# Patient Record
Sex: Female | Born: 1960 | Race: White | Hispanic: No | Marital: Married | Smoking: Never smoker
Health system: Southern US, Community
[De-identification: ages and names within clinical notes are randomized; demographics above are authoritative.]

## PROBLEM LIST (undated history)

## (undated) DIAGNOSIS — M8580 Other specified disorders of bone density and structure, unspecified site: Secondary | ICD-10-CM

## (undated) DIAGNOSIS — IMO0002 Reserved for concepts with insufficient information to code with codable children: Secondary | ICD-10-CM

## (undated) DIAGNOSIS — N83209 Unspecified ovarian cyst, unspecified side: Secondary | ICD-10-CM

## (undated) DIAGNOSIS — N921 Excessive and frequent menstruation with irregular cycle: Secondary | ICD-10-CM

## (undated) DIAGNOSIS — I10 Essential (primary) hypertension: Secondary | ICD-10-CM

## (undated) DIAGNOSIS — L920 Granuloma annulare: Secondary | ICD-10-CM

## (undated) DIAGNOSIS — B3731 Acute candidiasis of vulva and vagina: Secondary | ICD-10-CM

## (undated) DIAGNOSIS — B373 Candidiasis of vulva and vagina: Secondary | ICD-10-CM

## (undated) HISTORY — DX: Unspecified ovarian cyst, unspecified side: N83.209

## (undated) HISTORY — PX: EYE SURGERY: SHX253

## (undated) HISTORY — DX: Reserved for concepts with insufficient information to code with codable children: IMO0002

## (undated) HISTORY — DX: Candidiasis of vulva and vagina: B37.3

## (undated) HISTORY — DX: Granuloma annulare: L92.0

## (undated) HISTORY — DX: Essential (primary) hypertension: I10

## (undated) HISTORY — DX: Excessive and frequent menstruation with irregular cycle: N92.1

## (undated) HISTORY — DX: Other specified disorders of bone density and structure, unspecified site: M85.80

## (undated) HISTORY — DX: Acute candidiasis of vulva and vagina: B37.31

---

## 2000-04-02 ENCOUNTER — Encounter: Payer: Self-pay | Admitting: Emergency Medicine

## 2000-04-02 ENCOUNTER — Emergency Department (HOSPITAL_COMMUNITY): Admission: EM | Admit: 2000-04-02 | Discharge: 2000-04-02 | Payer: Self-pay | Admitting: Emergency Medicine

## 2000-10-04 ENCOUNTER — Other Ambulatory Visit: Admission: RE | Admit: 2000-10-04 | Discharge: 2000-10-04 | Payer: Self-pay | Admitting: Internal Medicine

## 2000-10-26 ENCOUNTER — Encounter: Payer: Self-pay | Admitting: Internal Medicine

## 2000-10-26 ENCOUNTER — Encounter: Admission: RE | Admit: 2000-10-26 | Discharge: 2000-10-26 | Payer: Self-pay | Admitting: Internal Medicine

## 2001-10-06 ENCOUNTER — Other Ambulatory Visit: Admission: RE | Admit: 2001-10-06 | Discharge: 2001-10-06 | Payer: Self-pay | Admitting: Internal Medicine

## 2002-01-20 ENCOUNTER — Encounter: Admission: RE | Admit: 2002-01-20 | Discharge: 2002-01-20 | Payer: Self-pay | Admitting: Internal Medicine

## 2002-01-20 ENCOUNTER — Encounter: Payer: Self-pay | Admitting: Internal Medicine

## 2003-04-13 ENCOUNTER — Encounter: Admission: RE | Admit: 2003-04-13 | Discharge: 2003-04-13 | Payer: Self-pay | Admitting: Internal Medicine

## 2003-04-13 ENCOUNTER — Encounter: Payer: Self-pay | Admitting: Internal Medicine

## 2004-04-21 ENCOUNTER — Encounter: Admission: RE | Admit: 2004-04-21 | Discharge: 2004-04-21 | Payer: Self-pay | Admitting: Internal Medicine

## 2004-06-03 ENCOUNTER — Encounter: Admission: RE | Admit: 2004-06-03 | Discharge: 2004-06-03 | Payer: Self-pay | Admitting: Internal Medicine

## 2004-08-26 ENCOUNTER — Encounter: Admission: RE | Admit: 2004-08-26 | Discharge: 2004-08-26 | Payer: Self-pay | Admitting: Internal Medicine

## 2004-12-09 ENCOUNTER — Other Ambulatory Visit: Admission: RE | Admit: 2004-12-09 | Discharge: 2004-12-09 | Payer: Self-pay | Admitting: Obstetrics and Gynecology

## 2005-05-13 ENCOUNTER — Encounter: Admission: RE | Admit: 2005-05-13 | Discharge: 2005-05-13 | Payer: Self-pay | Admitting: Internal Medicine

## 2005-10-21 ENCOUNTER — Other Ambulatory Visit: Admission: RE | Admit: 2005-10-21 | Discharge: 2005-10-21 | Payer: Self-pay | Admitting: Obstetrics and Gynecology

## 2006-05-17 ENCOUNTER — Encounter: Admission: RE | Admit: 2006-05-17 | Discharge: 2006-05-17 | Payer: Self-pay | Admitting: Obstetrics and Gynecology

## 2006-08-03 DIAGNOSIS — R87619 Unspecified abnormal cytological findings in specimens from cervix uteri: Secondary | ICD-10-CM

## 2006-08-03 DIAGNOSIS — IMO0002 Reserved for concepts with insufficient information to code with codable children: Secondary | ICD-10-CM

## 2006-08-03 HISTORY — DX: Unspecified abnormal cytological findings in specimens from cervix uteri: R87.619

## 2006-08-03 HISTORY — PX: COLPOSCOPY: SHX161

## 2006-08-03 HISTORY — DX: Reserved for concepts with insufficient information to code with codable children: IMO0002

## 2007-01-17 ENCOUNTER — Encounter: Admission: RE | Admit: 2007-01-17 | Discharge: 2007-01-17 | Payer: Self-pay | Admitting: Internal Medicine

## 2007-05-12 ENCOUNTER — Encounter: Admission: RE | Admit: 2007-05-12 | Discharge: 2007-05-12 | Payer: Self-pay | Admitting: Sports Medicine

## 2007-06-01 ENCOUNTER — Encounter: Admission: RE | Admit: 2007-06-01 | Discharge: 2007-06-01 | Payer: Self-pay | Admitting: Obstetrics and Gynecology

## 2008-06-04 ENCOUNTER — Encounter: Admission: RE | Admit: 2008-06-04 | Discharge: 2008-06-04 | Payer: Self-pay | Admitting: Obstetrics and Gynecology

## 2009-06-10 ENCOUNTER — Encounter: Admission: RE | Admit: 2009-06-10 | Discharge: 2009-06-10 | Payer: Self-pay | Admitting: Obstetrics and Gynecology

## 2010-06-13 ENCOUNTER — Encounter: Admission: RE | Admit: 2010-06-13 | Discharge: 2010-06-13 | Payer: Self-pay | Admitting: Internal Medicine

## 2010-08-23 ENCOUNTER — Encounter: Payer: Self-pay | Admitting: Internal Medicine

## 2011-05-13 ENCOUNTER — Other Ambulatory Visit: Payer: Self-pay | Admitting: Internal Medicine

## 2011-05-13 DIAGNOSIS — Z1231 Encounter for screening mammogram for malignant neoplasm of breast: Secondary | ICD-10-CM

## 2011-06-16 ENCOUNTER — Ambulatory Visit
Admission: RE | Admit: 2011-06-16 | Discharge: 2011-06-16 | Disposition: A | Discharge status: Home / Self Care | Payer: PRIVATE HEALTH INSURANCE | Source: Ambulatory Visit | Attending: Internal Medicine | Admitting: Internal Medicine

## 2011-06-16 DIAGNOSIS — Z1231 Encounter for screening mammogram for malignant neoplasm of breast: Secondary | ICD-10-CM

## 2012-02-03 ENCOUNTER — Encounter: Payer: Self-pay | Admitting: Obstetrics and Gynecology

## 2012-02-03 ENCOUNTER — Ambulatory Visit (INDEPENDENT_AMBULATORY_CARE_PROVIDER_SITE_OTHER): Payer: PRIVATE HEALTH INSURANCE | Admitting: Obstetrics and Gynecology

## 2012-02-03 ENCOUNTER — Telehealth: Payer: Self-pay | Admitting: Obstetrics and Gynecology

## 2012-02-03 VITALS — BP 100/62 | Resp 14 | Wt 106.0 lb

## 2012-02-03 DIAGNOSIS — R300 Dysuria: Secondary | ICD-10-CM

## 2012-02-03 DIAGNOSIS — B9689 Other specified bacterial agents as the cause of diseases classified elsewhere: Secondary | ICD-10-CM

## 2012-02-03 DIAGNOSIS — A499 Bacterial infection, unspecified: Secondary | ICD-10-CM

## 2012-02-03 DIAGNOSIS — N760 Acute vaginitis: Secondary | ICD-10-CM

## 2012-02-03 DIAGNOSIS — L293 Anogenital pruritus, unspecified: Secondary | ICD-10-CM

## 2012-02-03 DIAGNOSIS — N898 Other specified noninflammatory disorders of vagina: Secondary | ICD-10-CM

## 2012-02-03 LAB — POCT URINALYSIS DIPSTICK
Bilirubin, UA: NEGATIVE
Blood, UA: NEGATIVE
Glucose, UA: NEGATIVE
Ketones, UA: NEGATIVE
Leukocytes, UA: NEGATIVE
Nitrite, UA: NEGATIVE
Protein, UA: NEGATIVE
Spec Grav, UA: 1.015
Urobilinogen, UA: NEGATIVE
pH, UA: 6

## 2012-02-03 MED ORDER — METRONIDAZOLE 500 MG PO TABS: 500.0000 mg | ORAL_TABLET | Freq: Two times a day (BID) | ORAL | Status: AC

## 2012-02-03 MED ORDER — TERCONAZOLE 0.8 % VA CREA: 1.0000 | TOPICAL_CREAM | Freq: Every day | VAGINAL | Status: AC

## 2012-02-03 NOTE — Progress Notes (Signed)
Odor: no Fever: no Pelvic Pain: no  Itching: yes with irritation of labia Bilat Dyspareunia: Not sexually active Desires GC/CT: no  Thin: yes History of PID: no Desires HIV,RPR,HbsAG: no  Thick: no History of STD: no Other: c/o burning while voiding    Patient 51 y/o presents with Hx of vaginally itching x 2 weeks, associated episode with riding her bike and started to burn after this time. progresivesly worse in the past few days and now seeking medical review. Examination:  Labia bilat red and tender to touch, introitus dry. The patient had been complaining of vaginal dryness but has decline any vaginal cream for this condition. Speculum examination: Cervix slight irritation noted and after swab for Wet Prep slight bleeding noted (patient advised re spotting) vault pink and slight inflammation. Wet Prep: PH 4.0, Clue cells - BV-  Patient had requested Terazol cream in case of Yeast - same prescribed Dx: BV  Plan: BV Tx'ed with flagul 500 mgs po x 7 days          Terazol creamas requested by patient for ? Yeast and vaginal irritation          To return PRN  Or if symptoms do not subside.

## 2012-04-01 ENCOUNTER — Ambulatory Visit (INDEPENDENT_AMBULATORY_CARE_PROVIDER_SITE_OTHER): Payer: PRIVATE HEALTH INSURANCE | Admitting: Obstetrics and Gynecology

## 2012-04-01 ENCOUNTER — Encounter: Payer: Self-pay | Admitting: Obstetrics and Gynecology

## 2012-04-01 VITALS — BP 120/70 | HR 62 | Ht 61.0 in | Wt 105.0 lb

## 2012-04-01 DIAGNOSIS — Z124 Encounter for screening for malignant neoplasm of cervix: Secondary | ICD-10-CM

## 2012-04-01 DIAGNOSIS — M899 Disorder of bone, unspecified: Secondary | ICD-10-CM

## 2012-04-01 DIAGNOSIS — M949 Disorder of cartilage, unspecified: Secondary | ICD-10-CM

## 2012-04-01 DIAGNOSIS — N952 Postmenopausal atrophic vaginitis: Secondary | ICD-10-CM

## 2012-04-01 DIAGNOSIS — M8580 Other specified disorders of bone density and structure, unspecified site: Secondary | ICD-10-CM

## 2012-04-01 NOTE — Patient Instructions (Signed)
Atrophic Vaginitis Atrophic vaginitis is a problem of low levels of estrogen in women. This problem can happen at any age. It is most common in women who have gone through menopause ("the change").  HOW WILL I KNOW IF I HAVE THIS PROBLEM? You may have:  Trouble with peeing (urinating), such as:   Going to the bathroom often.   A hard time holding your pee until you reach a bathroom.   Leaking pee.   Having pain when you pee.   Itching or a burning feeling.   Vaginal bleeding and spotting.   Pain during sex.   Dryness of the vagina.   A yellow, bad-smelling fluid (discharge) coming from the vagina.  HOW WILL MY DOCTOR CHECK FOR THIS PROBLEM?  During your exam, your doctor will likely find the problem.   If there is a vaginal fluid, it may be checked for infection.  HOW WILL THIS PROBLEM BE TREATED? Keep the vulvar skin as clean as possible. Moisturizers and lubricants can help with some of the symptoms. Estrogen replacement can help. There are 2 ways to take estrogen:  Systemic estrogen gets estrogen to your whole body. It takes many weeks or months before the symptoms get better.   You take an estrogen pill.   You use a skin patch. This is a patch that you put on your skin.   If you still have your uterus, your doctor may ask you to take a hormone. Talk to your doctor about the right medicine for you.   Estrogen cream.  This puts estrogen only at the part of your body where you apply it. The cream is put into the vagina or put on the vulvar skin. For some women, estrogen cream works faster than pills or the patch.  CAN ALL WOMEN WITH THIS PROBLEM USE ESTROGEN? No. Women with certain types of cancer, liver problems, or problems with blood clots should not take estrogen. Your doctor can help you decide the best treatment for your symptoms. Document Released: 01/06/2008 Document Revised: 07/09/2011 Document Reviewed: 01/06/2008 [facility] Patient Information 2012 [place],  [place]. astroglide and replens can be used

## 2012-04-01 NOTE — Progress Notes (Signed)
Last Pap: 03/30/11 WNL: Yes Regular Periods:no Contraception: post menopausal  Monthly Breast exam:no Tetanus<85yrs:yes Nl.Bladder Function:yes Daily BMs:yes Healthy Diet:yes Calcium:no Mammogram:yes Date of Mammogram: 06/16/11 wnl Exercise:yes Have often Exercise: 4-5 times per week  Seatbelt: yes Abuse at home: no Stressful work:no Sigmoid-colonoscopy: 2011 wnl Bone Density: Yes 04/23/11 PCP: Dr. Dorothyann Peng Change in PMH: none Change in RUE:AVWU BP 120/70  Pulse 62  Ht 5\' 1"  (1.549 m)  Wt 105 lb (47.628 kg)  BMI 19.84 kg/m2 Physical Examination: Neck - supple, no significant adenopathy Chest - clear to auscultation, no wheezes, rales or rhonchi, symmetric air entry Heart - normal rate, regular rhythm, normal S1, S2, no murmurs, rubs, clicks or gallops Abdomen - soft, nontender, nondistended, no masses or organomegaly Breasts - breasts appear normal, no suspicious masses, no skin or nipple changes or axillary nodes Pelvic - normal external genitalia, vulva, vagina, cervix, uterus and adnexa, atrophic Rectal - normal rectal, no masses Musculoskeletal - no joint tenderness, deformity or swelling Extremities - peripheral pulses normal, no pedal edema, no clubbing or cyanosis Skin - normal coloration and turgor, no rashes, no suspicious skin lesions noted Normal AEX Atrophic vaginitis osteopenia Pt due for mammogram yes 11/13 colonoscopy due no Pap done yes per pts request RT one year Diet and exercise discussed.  Pt doing weight bearing exercise Dexa in 2015

## 2012-04-06 LAB — PAP IG W/ RFLX HPV ASCU

## 2012-04-09 LAB — HUMAN PAPILLOMAVIRUS, HIGH RISK: HPV DNA High Risk: NOT DETECTED

## 2012-04-13 ENCOUNTER — Telehealth: Payer: Self-pay

## 2012-04-13 NOTE — Telephone Encounter (Signed)
Spoke with pt rgd labs informed pap showed abnl cells per nd repeat pap 73yr pt voice understanding

## 2012-04-13 NOTE — Telephone Encounter (Signed)
Message copied by Rolla Plate on Wed Apr 13, 2012 10:16 AM ------      Message from: Jaymes Graff      Created: Tue Apr 12, 2012 10:38 PM       Please tell patient her pap results and that she can repeat her pap in one year.  Thank you

## 2012-04-14 ENCOUNTER — Telehealth: Payer: Self-pay | Admitting: Obstetrics and Gynecology

## 2012-04-14 NOTE — Telephone Encounter (Signed)
Spoke with pt rgd msg pt concerned about pap results informed pt pap showed ascus cells per ND repeat pap 1 yr pt states have a history of abnl pt [place] pt wants to make sure she doesn't need repeat pap in 6 months or colpo pt will fax records informed pt will consult with nd and call her back pt voice understanding

## 2012-04-14 NOTE — Telephone Encounter (Signed)
NICCOLE/ND PT

## 2012-04-15 NOTE — Telephone Encounter (Signed)
Spoke with pt rgd previous msg informed pt consult with nd she is ok with repeat pap 1 yr but if pt wants colpo she we can do one pt wants to have colpo done pt has appt 05/06/12 at 2:45 with ND pt voice understanding

## 2012-05-06 ENCOUNTER — Ambulatory Visit (INDEPENDENT_AMBULATORY_CARE_PROVIDER_SITE_OTHER): Payer: PRIVATE HEALTH INSURANCE | Admitting: Obstetrics and Gynecology

## 2012-05-06 ENCOUNTER — Encounter: Payer: Self-pay | Admitting: Obstetrics and Gynecology

## 2012-05-06 VITALS — BP 110/72 | Wt 104.0 lb

## 2012-05-06 DIAGNOSIS — R87610 Atypical squamous cells of undetermined significance on cytologic smear of cervix (ASC-US): Secondary | ICD-10-CM

## 2012-05-06 DIAGNOSIS — IMO0001 Reserved for inherently not codable concepts without codable children: Secondary | ICD-10-CM

## 2012-05-06 NOTE — Patient Instructions (Addendum)

## 2012-05-06 NOTE — Progress Notes (Signed)
Previous Pap Smear: ASCUS 04/01/12 Previous Colposcopy: 12/10/06 negative bx Referred From: n/a LMP: n/a  Contraception: post menopausal G,P: 0,0 colpo done.  Aw changes seen at seven o clock bx done at seven and with [facility]

## 2012-05-09 ENCOUNTER — Other Ambulatory Visit: Payer: Self-pay | Admitting: Internal Medicine

## 2012-05-09 DIAGNOSIS — Z1231 Encounter for screening mammogram for malignant neoplasm of breast: Secondary | ICD-10-CM

## 2012-05-11 ENCOUNTER — Telehealth: Payer: Self-pay | Admitting: Obstetrics and Gynecology

## 2012-05-11 LAB — PATHOLOGY

## 2012-05-11 NOTE — Telephone Encounter (Signed)
Tc to pt per telephone call. Informed pt still awaiting results to come back from colposcopy done 05/06/12.  Pt voices understanding.

## 2012-05-12 ENCOUNTER — Telehealth: Payer: Self-pay

## 2012-05-12 NOTE — Telephone Encounter (Signed)
Message copied by Rolla Plate on Thu May 12, 2012  9:47 AM ------      Message from: Jaymes Graff      Created: Wed May 11, 2012 11:22 PM       Please review colpo results with patient and tell her I recommend a pap every six months for the next year.

## 2012-05-12 NOTE — Telephone Encounter (Signed)
Spoke with pt rgd labs informed colpo results per ND repeat pap every 6 months pt voice understanding

## 2012-05-17 ENCOUNTER — Other Ambulatory Visit: Payer: Self-pay | Admitting: Internal Medicine

## 2012-05-17 DIAGNOSIS — H539 Unspecified visual disturbance: Secondary | ICD-10-CM

## 2012-05-17 DIAGNOSIS — R519 Headache, unspecified: Secondary | ICD-10-CM

## 2012-05-26 ENCOUNTER — Other Ambulatory Visit: Payer: Self-pay

## 2012-06-17 ENCOUNTER — Ambulatory Visit
Admission: RE | Admit: 2012-06-17 | Discharge: 2012-06-17 | Disposition: A | Discharge status: Home / Self Care | Payer: PRIVATE HEALTH INSURANCE | Source: Ambulatory Visit | Attending: Internal Medicine | Admitting: Internal Medicine

## 2012-06-17 DIAGNOSIS — Z1231 Encounter for screening mammogram for malignant neoplasm of breast: Secondary | ICD-10-CM

## 2012-06-22 ENCOUNTER — Encounter: Payer: Self-pay | Admitting: Obstetrics and Gynecology

## 2012-06-27 ENCOUNTER — Telehealth: Payer: Self-pay | Admitting: Obstetrics and Gynecology

## 2012-06-27 NOTE — Telephone Encounter (Signed)
Pt calling regarding dense breast letter that was received. Answered questions regarding dense breast.  Pt voiced understanding.  Susan Mason

## 2012-08-25 ENCOUNTER — Telehealth: Payer: Self-pay | Admitting: Obstetrics and Gynecology

## 2012-08-25 NOTE — Telephone Encounter (Signed)
Tc to pt regarding message. Pt states that her last office visit note said she was to come back in a year and she wanted to clarify. Informed pt that she suppose to repeat a pap every 6 months for a year per ND. Pt voiced understanding and appt for repeat pap was scheduled.

## 2012-09-16 ENCOUNTER — Encounter: Payer: PRIVATE HEALTH INSURANCE | Admitting: Obstetrics and Gynecology

## 2012-12-16 ENCOUNTER — Ambulatory Visit: Payer: Self-pay | Admitting: Nurse Practitioner

## 2013-05-24 ENCOUNTER — Other Ambulatory Visit: Payer: Self-pay

## 2013-05-24 DIAGNOSIS — Z1231 Encounter for screening mammogram for malignant neoplasm of breast: Secondary | ICD-10-CM

## 2013-07-07 ENCOUNTER — Ambulatory Visit
Admission: RE | Admit: 2013-07-07 | Discharge: 2013-07-07 | Disposition: A | Discharge status: Home / Self Care | Payer: PRIVATE HEALTH INSURANCE | Source: Ambulatory Visit

## 2013-07-07 DIAGNOSIS — Z1231 Encounter for screening mammogram for malignant neoplasm of breast: Secondary | ICD-10-CM

## 2014-03-13 ENCOUNTER — Other Ambulatory Visit: Payer: Self-pay | Admitting: Neurology

## 2014-03-13 DIAGNOSIS — R200 Anesthesia of skin: Secondary | ICD-10-CM

## 2014-03-21 ENCOUNTER — Other Ambulatory Visit: Payer: Self-pay | Admitting: Neurology

## 2014-03-21 ENCOUNTER — Ambulatory Visit
Admission: RE | Admit: 2014-03-21 | Discharge: 2014-03-21 | Disposition: A | Discharge status: Home / Self Care | Payer: PRIVATE HEALTH INSURANCE | Source: Ambulatory Visit | Attending: Neurology | Admitting: Neurology

## 2014-03-21 DIAGNOSIS — R200 Anesthesia of skin: Secondary | ICD-10-CM

## 2014-03-22 ENCOUNTER — Other Ambulatory Visit: Payer: Self-pay | Admitting: Neurology

## 2014-03-22 DIAGNOSIS — R202 Paresthesia of skin: Secondary | ICD-10-CM

## 2014-03-22 DIAGNOSIS — R200 Anesthesia of skin: Secondary | ICD-10-CM

## 2014-03-29 ENCOUNTER — Other Ambulatory Visit: Payer: PRIVATE HEALTH INSURANCE

## 2014-03-30 ENCOUNTER — Ambulatory Visit
Admission: RE | Admit: 2014-03-30 | Discharge: 2014-03-30 | Disposition: A | Discharge status: Home / Self Care | Payer: PRIVATE HEALTH INSURANCE | Source: Ambulatory Visit | Attending: Neurology | Admitting: Neurology

## 2014-03-30 DIAGNOSIS — R200 Anesthesia of skin: Secondary | ICD-10-CM

## 2014-03-30 DIAGNOSIS — R202 Paresthesia of skin: Secondary | ICD-10-CM

## 2014-04-04 ENCOUNTER — Other Ambulatory Visit: Payer: Self-pay | Admitting: Neurology

## 2014-04-04 DIAGNOSIS — R200 Anesthesia of skin: Secondary | ICD-10-CM

## 2014-04-18 ENCOUNTER — Ambulatory Visit
Admission: RE | Admit: 2014-04-18 | Discharge: 2014-04-18 | Disposition: A | Discharge status: Home / Self Care | Payer: PRIVATE HEALTH INSURANCE | Source: Ambulatory Visit | Attending: Neurology | Admitting: Neurology

## 2014-04-18 DIAGNOSIS — R200 Anesthesia of skin: Secondary | ICD-10-CM

## 2014-04-18 MED ORDER — GADOBENATE DIMEGLUMINE 529 MG/ML IV SOLN
9.0000 mL | Freq: Once | INTRAVENOUS | Status: AC | PRN
  Administered 2014-04-18: 9 mL via INTRAVENOUS

## 2014-06-01 ENCOUNTER — Other Ambulatory Visit: Payer: Self-pay

## 2014-06-01 DIAGNOSIS — Z1231 Encounter for screening mammogram for malignant neoplasm of breast: Secondary | ICD-10-CM

## 2014-07-13 ENCOUNTER — Ambulatory Visit
Admission: RE | Admit: 2014-07-13 | Discharge: 2014-07-13 | Disposition: A | Discharge status: Home / Self Care | Payer: PRIVATE HEALTH INSURANCE | Source: Ambulatory Visit

## 2014-07-13 ENCOUNTER — Other Ambulatory Visit: Payer: Self-pay

## 2014-07-13 DIAGNOSIS — Z1231 Encounter for screening mammogram for malignant neoplasm of breast: Secondary | ICD-10-CM

## 2014-11-12 ENCOUNTER — Other Ambulatory Visit: Payer: Self-pay | Admitting: Neurology

## 2014-11-12 DIAGNOSIS — E348 Other specified endocrine disorders: Secondary | ICD-10-CM

## 2014-11-12 DIAGNOSIS — R251 Tremor, unspecified: Secondary | ICD-10-CM

## 2014-11-12 DIAGNOSIS — R51 Headache: Secondary | ICD-10-CM

## 2014-11-12 DIAGNOSIS — R519 Headache, unspecified: Secondary | ICD-10-CM

## 2014-12-20 ENCOUNTER — Other Ambulatory Visit: Payer: PRIVATE HEALTH INSURANCE

## 2015-01-18 ENCOUNTER — Ambulatory Visit
Admission: RE | Admit: 2015-01-18 | Discharge: 2015-01-18 | Disposition: A | Discharge status: Home / Self Care | Payer: PRIVATE HEALTH INSURANCE | Source: Ambulatory Visit | Attending: Neurology | Admitting: Neurology

## 2015-01-18 DIAGNOSIS — R251 Tremor, unspecified: Secondary | ICD-10-CM

## 2015-01-18 DIAGNOSIS — E348 Other specified endocrine disorders: Secondary | ICD-10-CM

## 2015-01-18 DIAGNOSIS — R51 Headache: Secondary | ICD-10-CM

## 2015-01-18 DIAGNOSIS — R519 Headache, unspecified: Secondary | ICD-10-CM

## 2015-01-18 MED ORDER — GADOBENATE DIMEGLUMINE 529 MG/ML IV SOLN
9.0000 mL | Freq: Once | INTRAVENOUS | Status: AC | PRN
  Administered 2015-01-18: 9 mL via INTRAVENOUS

## 2015-05-24 LAB — HM COLONOSCOPY

## 2015-06-12 ENCOUNTER — Other Ambulatory Visit: Payer: Self-pay

## 2015-06-12 DIAGNOSIS — Z1231 Encounter for screening mammogram for malignant neoplasm of breast: Secondary | ICD-10-CM

## 2015-07-16 ENCOUNTER — Ambulatory Visit
Admission: RE | Admit: 2015-07-16 | Discharge: 2015-07-16 | Disposition: A | Discharge status: Home / Self Care | Payer: PRIVATE HEALTH INSURANCE | Source: Ambulatory Visit

## 2015-07-16 DIAGNOSIS — Z1231 Encounter for screening mammogram for malignant neoplasm of breast: Secondary | ICD-10-CM

## 2015-08-22 ENCOUNTER — Telehealth: Payer: Self-pay | Admitting: Cardiology

## 2015-08-22 NOTE — Telephone Encounter (Signed)
Pt called in wanting to speak with Elnita Maxwell about the Calcium deposits around her heart. Please f/u with her  Thanks

## 2015-08-22 NOTE — Telephone Encounter (Signed)
Patient wishes to talk to [facility] and willing to wait until tomorrow to talk to her

## 2015-08-23 NOTE — Telephone Encounter (Signed)
Wrong chart this is patient's daughter.

## 2016-06-10 ENCOUNTER — Other Ambulatory Visit: Payer: Self-pay | Admitting: Obstetrics and Gynecology

## 2016-06-10 DIAGNOSIS — Z1231 Encounter for screening mammogram for malignant neoplasm of breast: Secondary | ICD-10-CM

## 2016-07-16 ENCOUNTER — Ambulatory Visit: Payer: PRIVATE HEALTH INSURANCE

## 2016-10-08 ENCOUNTER — Ambulatory Visit
Admission: RE | Admit: 2016-10-08 | Discharge: 2016-10-08 | Disposition: A | Discharge status: Home / Self Care | Payer: PRIVATE HEALTH INSURANCE | Source: Ambulatory Visit | Attending: Obstetrics and Gynecology | Admitting: Obstetrics and Gynecology

## 2016-10-08 DIAGNOSIS — Z1231 Encounter for screening mammogram for malignant neoplasm of breast: Secondary | ICD-10-CM

## 2017-03-16 ENCOUNTER — Other Ambulatory Visit: Payer: Self-pay | Admitting: Internal Medicine

## 2017-03-16 DIAGNOSIS — E2839 Other primary ovarian failure: Secondary | ICD-10-CM

## 2017-03-25 ENCOUNTER — Ambulatory Visit
Admission: RE | Admit: 2017-03-25 | Discharge: 2017-03-25 | Disposition: A | Discharge status: Home / Self Care | Payer: PRIVATE HEALTH INSURANCE | Source: Ambulatory Visit | Attending: Internal Medicine | Admitting: Internal Medicine

## 2017-03-25 DIAGNOSIS — E2839 Other primary ovarian failure: Secondary | ICD-10-CM

## 2017-09-09 ENCOUNTER — Other Ambulatory Visit: Payer: Self-pay | Admitting: Internal Medicine

## 2017-09-09 ENCOUNTER — Other Ambulatory Visit: Payer: Self-pay | Admitting: Obstetrics and Gynecology

## 2017-09-09 DIAGNOSIS — Z1231 Encounter for screening mammogram for malignant neoplasm of breast: Secondary | ICD-10-CM

## 2017-10-20 ENCOUNTER — Ambulatory Visit
Admission: RE | Admit: 2017-10-20 | Discharge: 2017-10-20 | Disposition: A | Discharge status: Home / Self Care | Payer: PRIVATE HEALTH INSURANCE | Source: Ambulatory Visit | Attending: Internal Medicine | Admitting: Internal Medicine

## 2017-10-20 DIAGNOSIS — Z1231 Encounter for screening mammogram for malignant neoplasm of breast: Secondary | ICD-10-CM

## 2018-04-26 ENCOUNTER — Ambulatory Visit: Payer: PRIVATE HEALTH INSURANCE | Admitting: Nurse Practitioner

## 2018-04-26 ENCOUNTER — Ambulatory Visit: Payer: PRIVATE HEALTH INSURANCE

## 2018-04-26 VITALS — BP 128/80 | HR 64 | Temp 97.9°F | Resp 16

## 2018-04-26 DIAGNOSIS — Z516 Encounter for desensitization to allergens: Secondary | ICD-10-CM

## 2018-04-26 NOTE — Progress Notes (Signed)
   Subjective:    Patient ID: Susan Mason, female    DOB: Jan 28, 1961, 57 y.o.   MRN: [id number]  HPI Susan Mason is here for here weekly allergy shots. She denies any side effects from last week injection. Denies site reaction, wheezing, SOB or chest pain.    Review of Systems  Constitutional: Negative for chills, fatigue and fever.  Respiratory: Negative for cough, shortness of breath and wheezing.   Allergic/Immunologic: Positive for environmental allergies and food allergies.       Objective:   Physical Exam  Constitutional: She is oriented to person, place, and time. She appears well-developed and well-nourished. No distress.  HENT:  Head: Normocephalic.  Neck: Normal range of motion.  Cardiovascular: Normal rate, regular rhythm and normal heart sounds.  Pulmonary/Chest: Effort normal and breath sounds normal. No respiratory distress. She has no wheezes.  Neurological: She is alert and oriented to person, place, and time.  Skin: Skin is warm and dry.  Psychiatric: She has a normal mood and affect.  Vitals reviewed.         Assessment & Plan:

## 2018-04-26 NOTE — Patient Instructions (Signed)
Monitor for s/s of reaction Continue to carry epi pen RTC 1 week for allergy injection

## 2018-04-26 NOTE — Progress Notes (Deleted)
   Subjective:    Patient ID: Susan Mason, female    DOB: 1961/07/31, 57 y.o.   MRN: [id number]  HPI    Review of Systems     Objective:           Assessment & Plan:

## 2018-05-03 ENCOUNTER — Ambulatory Visit: Payer: PRIVATE HEALTH INSURANCE | Admitting: Nurse Practitioner

## 2018-05-03 VITALS — BP 118/78 | HR 56 | Temp 97.6°F

## 2018-05-03 DIAGNOSIS — Z516 Encounter for desensitization to allergens: Secondary | ICD-10-CM

## 2018-05-03 NOTE — Progress Notes (Signed)
   Subjective:    Patient ID: Susan Mason, female    DOB: [birth year], 57 y.o.   MRN: [id number]  HPI Tayah is here today for weekly allergy injections. Has been taking allergy shots for years and has no complaints today. Denies reactions, SOB/wheezing or chest pain.    Review of Systems  Constitutional: Negative.   Respiratory: Negative.   Cardiovascular: Negative.   Skin: Negative.        Objective:   Physical Exam  Constitutional: She is oriented to person, place, and time. She appears well-developed and well-nourished.  HENT:  Head: Normocephalic.  Neck: Normal range of motion.  Cardiovascular: Normal rate and regular rhythm.  Pulmonary/Chest: Effort normal. No respiratory distress.  Neurological: She is alert and oriented to person, place, and time.  Skin: Skin is warm and dry.  Psychiatric: She has a normal mood and affect.  Vitals reviewed.         Assessment & Plan:

## 2018-05-03 NOTE — Patient Instructions (Addendum)
Received allergy shots today from pt serum 0.3 ml on right arm and 0.5 ml to left arm. Pt tolerated inj well Monitor for s/s of reaction Continue to carry epi pen RTC 1 week for next injections

## 2018-05-10 ENCOUNTER — Ambulatory Visit: Payer: Self-pay | Admitting: Nurse Practitioner

## 2018-05-10 VITALS — BP 118/68 | HR 62 | Temp 97.9°F | Resp 16

## 2018-05-10 DIAGNOSIS — Z516 Encounter for desensitization to allergens: Secondary | ICD-10-CM

## 2018-05-10 NOTE — Patient Instructions (Signed)
Continue to carry epi pen Monitor for s/s of reaction RTC 1 week for allergy shot

## 2018-05-10 NOTE — Progress Notes (Signed)
   Subjective:    Patient ID: Susan Mason, female    DOB: [birth year], 57 y.o.   MRN: [id number]  HPI Susan Mason is here today for weekly allergy shots. She denies any reactions, SOB, wheezing or chest pain. Has been taking allergy shots for years and feels they really have helped her.    Review of Systems  Respiratory: Negative for shortness of breath and wheezing.   Cardiovascular: Negative for chest pain.  Skin: Negative for rash.       Objective:   Physical Exam  Constitutional: She is oriented to person, place, and time. She appears well-developed and well-nourished.  HENT:  Head: Normocephalic.  Neck: Normal range of motion.  Cardiovascular: Normal rate.  Pulmonary/Chest: Effort normal. No respiratory distress.  Neurological: She is alert and oriented to person, place, and time.  Skin: Skin is warm and dry.  Psychiatric: She has a normal mood and affect.  Vitals reviewed.         Assessment & Plan:

## 2018-05-17 ENCOUNTER — Ambulatory Visit: Payer: Self-pay | Admitting: Nurse Practitioner

## 2018-05-17 VITALS — BP 126/64 | HR 57 | Temp 98.2°F

## 2018-05-17 DIAGNOSIS — Z516 Encounter for desensitization to allergens: Secondary | ICD-10-CM

## 2018-05-17 NOTE — Progress Notes (Signed)
   Subjective:    Patient ID: Susan Mason, female    DOB: 08/23/1960, 57 y.o.   MRN: [id number]  HPI Susan Mason [facility] for her weekly allergy injections. She denies any reactions or any allergy symptoms. She denies any SOB, c/p, wheezing or fever.    Review of Systems  Constitutional: Negative for chills and fever.  HENT: Negative for congestion.   Respiratory: Negative for shortness of breath and wheezing.   Cardiovascular: Negative for chest pain.  Skin: Negative for color change and rash.  Allergic/Immunologic:       Has environmental allergies, but allergy shots are working well.       Objective:   Physical Exam  Constitutional: She is oriented to person, place, and time. She appears well-developed and well-nourished.  HENT:  Head: Normocephalic.  Neck: Normal range of motion.  Cardiovascular:  bradycardia  Pulmonary/Chest: Effort normal. No respiratory distress.  Musculoskeletal: Normal range of motion.  Neurological: She is alert and oriented to person, place, and time.  Skin: Skin is warm and dry.  Psychiatric: She has a normal mood and affect.  Vitals reviewed.         Assessment & Plan:

## 2018-05-17 NOTE — Patient Instructions (Signed)
Monitor for s/s of reaction Continue to carry epipen RTC 1 week for allergy shot

## 2018-05-24 ENCOUNTER — Ambulatory Visit: Payer: Self-pay | Admitting: Nurse Practitioner

## 2018-05-24 ENCOUNTER — Ambulatory Visit: Payer: PRIVATE HEALTH INSURANCE

## 2018-05-24 VITALS — BP 120/70 | HR 51 | Temp 98.2°F | Resp 16 | Ht 61.5 in | Wt 107.4 lb

## 2018-05-24 DIAGNOSIS — Z516 Encounter for desensitization to allergens: Secondary | ICD-10-CM

## 2018-05-24 NOTE — Patient Instructions (Signed)
Continue weekly allergy injections Continue to carry epi pen Monitor for s/s of reaction RTC PRN/weekly for allergy injections

## 2018-05-24 NOTE — Progress Notes (Signed)
   Subjective:    Patient ID: Susan Mason, female    DOB: [birth year], 57 y.o.   MRN: [id number]  HPI  Ahmia returns to the [facility] site clinic to receive her weekly allergy injections. She has been doing well and denies any side effects or c/p,SOB, wheezing, respiratory symptoms.   Discussed low heart rate and she denies dizziness, any syncopal episodes, or fatigue. "I feel great".     Review of Systems  Constitutional: Negative for fatigue and fever.  Respiratory: Negative for shortness of breath and wheezing.   Cardiovascular: Negative for chest pain.  Neurological: Negative for dizziness, syncope and light-headedness.       Objective:   Physical Exam  Constitutional: She is oriented to person, place, and time. She appears well-developed and well-nourished. No distress.  HENT:  Head: Normocephalic and atraumatic.  Neck: Normal range of motion.  Cardiovascular:  Bradycardia; ongoing finding  Pulmonary/Chest: Effort normal. No respiratory distress.  Musculoskeletal: Normal range of motion.  Neurological: She is alert and oriented to person, place, and time.  Skin: Skin is warm and dry.  Psychiatric: She has a normal mood and affect.  Vitals reviewed.         Assessment & Plan:

## 2018-06-07 ENCOUNTER — Ambulatory Visit: Payer: Self-pay | Admitting: Nurse Practitioner

## 2018-06-07 VITALS — BP 118/70 | HR 62 | Temp 98.0°F | Resp 14

## 2018-06-07 DIAGNOSIS — Z516 Encounter for desensitization to allergens: Secondary | ICD-10-CM

## 2018-06-07 NOTE — Progress Notes (Signed)
   Subjective:    Patient ID: Susan Mason, female    DOB: 1960/10/21, 57 y.o.   MRN: [id number]  HPI Susan Mason returns to the clinic today for weekly allergy injections. She has not had any reactions from injections and to f/u with allergist next week. She denies any SOB, c/p or site reactions.    Review of Systems  Constitutional: Negative for chills and fever.  HENT: Negative for congestion.   Respiratory: Negative for shortness of breath and wheezing.   Cardiovascular: Negative for chest pain.  Skin: Negative for color change and rash.       Objective:   Physical Exam  Constitutional: She is oriented to person, place, and time. She appears well-developed and well-nourished.  HENT:  Head: Normocephalic.  Neck: Normal range of motion.  Pulmonary/Chest: Effort normal. No respiratory distress.  Musculoskeletal: Normal range of motion.  Neurological: She is alert and oriented to person, place, and time.  Skin: Skin is warm and dry.  Psychiatric: She has a normal mood and affect.  Vitals reviewed.         Assessment & Plan:

## 2018-06-07 NOTE — Patient Instructions (Signed)
Monitor for s/s of reaction Continue to carry epi-pen RTC 1 week for next injections

## 2018-06-09 ENCOUNTER — Ambulatory Visit: Payer: PRIVATE HEALTH INSURANCE

## 2018-06-16 ENCOUNTER — Ambulatory Visit: Payer: Self-pay | Admitting: Internal Medicine

## 2018-06-16 ENCOUNTER — Encounter: Payer: Self-pay | Admitting: Nurse Practitioner

## 2018-06-16 ENCOUNTER — Ambulatory Visit (INDEPENDENT_AMBULATORY_CARE_PROVIDER_SITE_OTHER): Payer: PRIVATE HEALTH INSURANCE | Admitting: Nurse Practitioner

## 2018-06-16 VITALS — BP 120/76 | HR 60 | Temp 98.2°F | Ht 61.25 in | Wt 106.2 lb

## 2018-06-16 DIAGNOSIS — I10 Essential (primary) hypertension: Secondary | ICD-10-CM

## 2018-06-16 LAB — BMP8+EGFR
BUN/Creatinine Ratio: 20 (ref 9–23)
BUN: 16 mg/dL (ref 6–24)
CO2: 24 mmol/L (ref 20–29)
Calcium: 9.5 mg/dL (ref 8.7–10.2)
Chloride: 102 mmol/L (ref 96–106)
Creatinine, Ser: 0.81 mg/dL (ref 0.57–1.00)
GFR calc Af Amer: 93 mL/min/{1.73_m2} (ref >59)
GFR calc non Af Amer: 80 mL/min/{1.73_m2} (ref >59)
Glucose: 70 mg/dL (ref 65–99)
Potassium: 4.7 mmol/L (ref 3.5–5.2)
Sodium: 139 mmol/L (ref 134–144)

## 2018-06-16 LAB — LIPID PANEL
Chol/HDL Ratio: 3.3 ratio (ref 0.0–4.4)
Cholesterol, Total: 186 mg/dL (ref 100–199)
HDL: 56 mg/dL (ref >39)
LDL Calculated: 114 mg/dL — ABNORMAL HIGH (ref 0–99)
Triglycerides: 82 mg/dL (ref 0–149)
VLDL Cholesterol Cal: 16 mg/dL (ref 5–40)

## 2018-06-16 NOTE — Progress Notes (Signed)
  Subjective:     Patient ID: Susan Mason , female    DOB: January 30, 1961 , 57 y.o.   MRN: [id number]   Chief Complaint  Patient presents with  . Hypertension    HPI  Hypertension  This is a chronic problem. The current episode started more than 1 year ago. The problem is unchanged. The problem is controlled. Pertinent negatives include no anxiety or malaise/fatigue. Past treatments include ACE inhibitors. There are no compliance problems.  There is no history of CAD/MI.     Past Medical History:  Diagnosis Date  . Abnormal Pap smear 2008   ASCUS  . High blood pressure   . Metrorrhagia   . Osteopenia   . Ovarian cyst   . Yeast vaginitis      Family History  Problem Relation Age of Onset  . Breast cancer Neg Hx      Current Outpatient Medications:  .  cetirizine (ZYRTEC) 10 MG tablet, Take 10 mg by mouth daily., Disp: , Rfl:  .  cholecalciferol (VITAMIN D) 1000 UNITS tablet, Take 1,000 Units by mouth daily., Disp: , Rfl:  .  lisinopril (PRINIVIL,ZESTRIL) 5 MG tablet, Take 5 mg by mouth daily. , Disp: , Rfl:  .  Multiple Vitamins-Minerals (MULTIVITAMIN WITH MINERALS) tablet, Take 1 tablet by mouth daily., Disp: , Rfl:  .  Probiotic Product (PROBIOTIC-10 PO), Take 1 tablet by mouth daily., Disp: , Rfl:    Allergies  Allergen Reactions  . Penicillins   . Doxycycline   . Erythromycin   . Erythromycin Base      Review of Systems  Constitutional: Negative.  Negative for malaise/fatigue.  HENT: Negative.   Respiratory: Negative.   Cardiovascular: Negative.   Skin: Negative.   Neurological: Negative.      Today's Vitals   06/16/18 0849  BP: 120/76  Pulse: 60  Temp: 98.2 F (36.8 C)  TempSrc: Oral  SpO2: 94%  Weight: 106 lb 3.2 oz (48.2 kg)  Height: 5' 1.25" (1.556 m)  PainSc: 0-No pain   Body mass index is 19.9 kg/m.   Objective:  Physical Exam  Constitutional: She is oriented to person, place, and time. She appears well-developed and well-nourished.   Eyes: Pupils are equal, round, and reactive to light.  Neck: Full passive range of motion without pain. No JVD present. Carotid bruit is not present.  Cardiovascular: Normal rate, regular rhythm and normal heart sounds.  Pulses:      Posterior tibial pulses are 1+ on the right side, and 1+ on the left side.  Pulmonary/Chest: Effort normal and breath sounds normal.  Neurological: She is alert and oriented to person, place, and time. No cranial nerve deficit.  Skin: Skin is warm and dry. Capillary refill takes less than 2 seconds.  Vitals reviewed.       Assessment And Plan:     1. Essential hypertension  Chronic, excellent control  Continue with current medications - BMP8+eGFR - Lipid panel       Minette Brine, FNP

## 2018-06-16 NOTE — Patient Instructions (Signed)

## 2018-06-21 ENCOUNTER — Ambulatory Visit: Payer: Self-pay | Admitting: Nurse Practitioner

## 2018-06-21 VITALS — BP 110/66 | HR 57 | Temp 98.2°F | Resp 16

## 2018-06-21 DIAGNOSIS — Z516 Encounter for desensitization to allergens: Secondary | ICD-10-CM

## 2018-06-21 NOTE — Patient Instructions (Signed)
Monitor for sign or symptoms or reaction Continue to carry epi pen RTC 1 week

## 2018-06-21 NOTE — Progress Notes (Signed)
   Subjective:    Patient ID: Susan Mason, female    DOB: 1961-05-07, 57 y.o.   MRN: [id number]  HPI Okey DupreRose comes to the office for her allergy injections and denies any sneezing, nasal congestion, runny nose or allergy concerns today. Had allergy testing last week by allergist and reports next serum will be adjusted some. She has been taking allergy shots for years and is tolerating with reports allergies are doing very well on this treatment regimen. Has gotten new epi pen and keeps it with her. No concerns today.    Review of Systems  Constitutional: Negative for chills and fever.  HENT: Negative for congestion.   Respiratory: Negative for shortness of breath and wheezing.   Cardiovascular: Negative for chest pain.  Skin: Negative for color change and rash.       Objective:   Physical Exam  Constitutional: She is oriented to person, place, and time. She appears well-developed and well-nourished.  HENT:  Head: Normocephalic.  Neck: Normal range of motion.  Pulmonary/Chest: Effort normal. No respiratory distress.  Musculoskeletal: Normal range of motion.  Neurological: She is alert and oriented to person, place, and time.  Skin: Skin is warm and dry.  Psychiatric: She has a normal mood and affect.  Vitals reviewed. Received 0.5 ml to both arms today with serum she provided. Tolerated injections.        Assessment & Plan:

## 2018-06-28 ENCOUNTER — Ambulatory Visit: Payer: Self-pay | Admitting: Nurse Practitioner

## 2018-06-28 VITALS — BP 128/80 | HR 65 | Temp 97.9°F | Resp 16 | Ht 61.0 in | Wt 104.0 lb

## 2018-06-28 DIAGNOSIS — Z516 Encounter for desensitization to allergens: Secondary | ICD-10-CM

## 2018-06-28 NOTE — Patient Instructions (Signed)
Continue to carry Epipen Monitor for sign or symptoms or reaction Return to clinic 1 week for next injection

## 2018-06-28 NOTE — Progress Notes (Deleted)
Duplicate

## 2018-06-28 NOTE — Progress Notes (Signed)
   Subjective:    Patient ID: Susan Mason, female    DOB: [birth year], 57 y.o.   MRN: [id number]  HPI  Susan Mason comes to the [facility] for her weekly injection. Reports she is doing well and tolerated last injection. No c/o SOB, wheezing or c/p. Denies any respiratory problems.    Review of Systems  Constitutional: Negative for chills and fever.  HENT: Negative for congestion.   Respiratory: Negative for shortness of breath and wheezing.   Cardiovascular: Negative for chest pain.  Skin: Negative for color change and rash.       Objective:   Physical Exam  Constitutional: She is oriented to person, place, and time. She appears well-developed and well-nourished.  HENT:  Head: Normocephalic.  Neck: Normal range of motion.  Pulmonary/Chest: No respiratory distress.  Musculoskeletal: Normal range of motion.  Neurological: She is alert and oriented to person, place, and time.  Skin: Skin is warm and dry.  Psychiatric: She has a normal mood and affect.  Vitals reviewed.   0.5 ml Allergy serum injection given to bilateral arm. Tolerated well      Assessment & Plan:

## 2018-07-05 ENCOUNTER — Ambulatory Visit: Payer: PRIVATE HEALTH INSURANCE

## 2018-07-12 ENCOUNTER — Ambulatory Visit: Payer: Self-pay | Admitting: Nurse Practitioner

## 2018-07-12 VITALS — BP 110/60 | HR 68 | Temp 98.4°F | Resp 16

## 2018-07-12 DIAGNOSIS — B9789 Other viral agents as the cause of diseases classified elsewhere: Secondary | ICD-10-CM

## 2018-07-12 DIAGNOSIS — J019 Acute sinusitis, unspecified: Principal | ICD-10-CM

## 2018-07-12 DIAGNOSIS — H6593 Unspecified nonsuppurative otitis media, bilateral: Secondary | ICD-10-CM

## 2018-07-12 MED ORDER — FEXOFENADINE HCL 180 MG PO TABS: 180.0000 mg | ORAL_TABLET | Freq: Every day | ORAL | 1 refills | Status: DC

## 2018-07-12 MED ORDER — FLUTICASONE PROPIONATE 50 MCG/ACT NA SUSP: NASAL | 1 refills | Status: DC

## 2018-07-12 NOTE — Patient Instructions (Signed)
Stop zyrtec and start fexofenadine as directed Start saline rinsing sinuses twice daily and using fluticasone after Although you don't want to get allergy shots today, return Thursday for shots Fluids and rest encouraged If no improvement or symptoms worsen return to clinic for evaluation

## 2018-07-12 NOTE — Progress Notes (Signed)
   Subjective:    Patient ID: Susan Mason, female    DOB: 10/31/1960, 57 y.o.   MRN: [id number]  HPI Susan Mason comes to the [facility] clinic today to get allergy shots but wishes to wait because she isn't feeling well. Reports head pressure, scratchy throat and left ear pain x 3 days. Has been taking routine zyrtec, tylenol, and salt water with peroxide gargles with little relief. Denies cough, SOB, c/p, or fever. Denies anyone in home with symptoms or recent travel.   Review of Systems  Constitutional: Positive for fatigue. Negative for chills and fever.  HENT: Positive for ear pain, sinus pressure, sinus pain and sore throat. Negative for congestion and sneezing.   Respiratory: Negative for cough and shortness of breath.   Cardiovascular: Negative for chest pain.       Objective:   Physical Exam  Constitutional: She is oriented to person, place, and time. She appears well-developed and well-nourished.  HENT:  Head: Normocephalic and atraumatic.  Bilateral clear serous fluids to intact nonerythematous TM. Right side bulging and left side with bubbles. No maxillary or frontal sinus tenderness. Pharnyx mildly injected.  Neck: Normal range of motion.  Cardiovascular: Normal rate, regular rhythm and normal heart sounds.  Pulmonary/Chest: Effort normal and breath sounds normal. No respiratory distress.  Musculoskeletal: Normal range of motion.  Lymphadenopathy:    She has no cervical adenopathy.  Neurological: She is alert and oriented to person, place, and time.  Skin: Skin is warm and dry.  Psychiatric: She has a normal mood and affect.  Vitals reviewed.         Assessment & Plan:

## 2018-07-14 ENCOUNTER — Ambulatory Visit: Payer: Self-pay | Admitting: Nurse Practitioner

## 2018-07-14 ENCOUNTER — Encounter: Payer: Self-pay | Admitting: Nurse Practitioner

## 2018-07-14 VITALS — BP 112/60 | HR 89 | Temp 98.4°F | Resp 16

## 2018-07-14 DIAGNOSIS — B9789 Other viral agents as the cause of diseases classified elsewhere: Secondary | ICD-10-CM

## 2018-07-14 DIAGNOSIS — J329 Chronic sinusitis, unspecified: Secondary | ICD-10-CM

## 2018-07-14 DIAGNOSIS — Z516 Encounter for desensitization to allergens: Secondary | ICD-10-CM

## 2018-07-14 NOTE — Patient Instructions (Signed)
Get meds today and take as directed Discussed decongestants can raise pressure but hers is very controlled. To choose one of the over the counter and take as directed Get meds from pharmacy I sent a few days ago and take as directed Increase fluids and rest Return next week for weekly allergy injection

## 2018-07-14 NOTE — Progress Notes (Addendum)
   Subjective:    Patient ID: Susan Mason, female    DOB: 02/29/1961, 57 y.o.   MRN: [id number]  HPI Susan Mason is here today for weekly  Allergy injections which she is taking as directed and tolerating. Reports she isn't feeling any better from OV 2 days ago as she hasn't started recommended meds. She has a lot of head pressure and her face was "pulsating last night". She was in office 2 days ago and Rx sent but reports pharmacy didn't have them and they are supposed to be ready today. Has taken sudafed, alkazeltzer plus and regular allergy med. Denies fever, SOB,facial pain, c/p or wheezing.    Review of Systems  Constitutional: Positive for fatigue. Negative for fever.  HENT: Positive for congestion and sinus pressure.   Respiratory: Negative for cough and wheezing.   Cardiovascular: Negative for chest pain.       Objective:   Physical Exam Vitals signs reviewed.  Constitutional:      Appearance: She is well-developed.  HENT:     Head: Normocephalic.     Comments: Positive pressure on palpation of maxillary and frontal sinuses but no tenderness. Bilateral ears with clear serous fluid to intact nonerythematous TM. Neck:     Musculoskeletal: Normal range of motion.  Cardiovascular:     Rate and Rhythm: Normal rate and regular rhythm.     Heart sounds: Normal heart sounds.  Pulmonary:     Effort: Pulmonary effort is normal. No respiratory distress.     Breath sounds: Normal breath sounds.  Abdominal:     General: Bowel sounds are normal.     Palpations: Abdomen is soft.  Musculoskeletal: Normal range of motion.  Skin:    General: Skin is warm and dry.  Neurological:     Mental Status: She is alert and oriented to person, place, and time.     Allergy serum shots given bilateral upper arm ad tolerated well      Assessment & Plan:

## 2018-07-21 ENCOUNTER — Ambulatory Visit: Payer: Self-pay | Admitting: Nurse Practitioner

## 2018-07-21 VITALS — BP 138/82 | HR 72 | Temp 98.0°F | Resp 16

## 2018-07-21 DIAGNOSIS — Z516 Encounter for desensitization to allergens: Secondary | ICD-10-CM

## 2018-07-21 DIAGNOSIS — H65193 Other acute nonsuppurative otitis media, bilateral: Secondary | ICD-10-CM

## 2018-07-21 NOTE — Patient Instructions (Addendum)
Continue to carry epipen Continue allergy and flonase as directed Discussed blood pressure higher than normal today and to try to keep stress down Monitor for signs or symptoms of any reactions Return to clinic in 1 week for next allergy shots

## 2018-07-21 NOTE — Progress Notes (Signed)
Susan Mason comes to the health and wellness clinic today for weekly allergy shots. She does admit she is under a lot of stress due to circumstances with mother but doing well otherwise and much improved from last week viral sinusitis. She denies any SOB, wheezing, or skin rashes. Is requesting her ears be checked today.  ROS: General: denies fever or malaise Ears: denies any ear pain or drainage CV: denies any C/P or palpitations Skin: denies any rashes or lesions  PE:  General: Alert and no acute distress Ears: clear serous fluid to right and ledt intact nonerythematous TM but right worse than left Neck: normal ROM, neck supple MSK: normal gait Skin: warm, dry and intact  Allergy serum Injection administered by provider to right and left upper arm: tolerated injections well

## 2018-07-28 ENCOUNTER — Ambulatory Visit: Payer: Self-pay | Admitting: Nurse Practitioner

## 2018-07-28 VITALS — BP 130/70 | HR 64 | Temp 97.7°F | Resp 16 | Wt 105.0 lb

## 2018-07-28 DIAGNOSIS — Z516 Encounter for desensitization to allergens: Secondary | ICD-10-CM

## 2018-07-28 NOTE — Progress Notes (Signed)
   Subjective:    Patient ID: Susan Mason, female    DOB: [birth year], 57 y.o.   MRN: [id number]  HPI Susan Mason comes to the health and wellness clinic for weekly allergy injections. She denies any reactions, SOB, wheezing or chest pain. She has since stopped allergra and flonase and gone back to zyrtec and doing well.     Review of Systems  Constitutional: Negative for chills and fever.  HENT: Negative for congestion.   Respiratory: Negative for shortness of breath and wheezing.   Cardiovascular: Negative for chest pain.  Skin: Negative for color change and rash.       Objective:   Physical Exam Vitals signs reviewed.  Constitutional:      Appearance: She is well-developed.  HENT:     Head: Normocephalic.  Neck:     Musculoskeletal: Normal range of motion.  Cardiovascular:     Rate and Rhythm: Normal rate.  Pulmonary:     Effort: Pulmonary effort is normal. No respiratory distress.  Musculoskeletal: Normal range of motion.  Skin:    General: Skin is warm and dry.  Neurological:     Mental Status: She is alert and oriented to person, place, and time.     Bilateral arm allergy serum injections given. Tolerated procedure well.      Assessment & Plan:

## 2018-07-28 NOTE — Patient Instructions (Addendum)
Continue weekly allergy injections per asthma and allergist specialist. Needs new serum for Right arm and to order this Continue to carry epi pen Return to clinic 1 week for next injection

## 2018-08-02 ENCOUNTER — Ambulatory Visit: Payer: Self-pay | Admitting: Nurse Practitioner

## 2018-08-02 VITALS — BP 102/62 | HR 61 | Temp 97.7°F | Resp 16

## 2018-08-02 DIAGNOSIS — Z516 Encounter for desensitization to allergens: Secondary | ICD-10-CM

## 2018-08-02 NOTE — Progress Notes (Signed)
   Subjective:    Patient ID: Susan Mason, female    DOB: 1960/12/28, 57 y.o.   MRN: [id number]  HPI  Susan Mason comes to the health and wellness clinic for weekly allergy injections. She is doing well and has no complaints today. She denies any reactions, SOB, wheezing or chest pain.     Review of Systems  Constitutional: Negative for chills and fever.  HENT: Negative for congestion.   Respiratory: Negative for shortness of breath and wheezing.   Cardiovascular: Negative for chest pain.  Skin: Negative for color change and rash.       Objective:   Physical Exam Vitals signs reviewed.  Constitutional:      Appearance: She is well-developed.  HENT:     Head: Normocephalic.  Neck:     Musculoskeletal: Normal range of motion.  Cardiovascular:     Rate and Rhythm: Normal rate.  Pulmonary:     Effort: Pulmonary effort is normal. No respiratory distress.  Musculoskeletal: Normal range of motion.  Skin:    General: Skin is warm and dry.  Neurological:     Mental Status: She is alert and oriented to person, place, and time.     Bilateral arm allergy serum injections given. Tolerated injections well.      Assessment & Plan:

## 2018-08-02 NOTE — Patient Instructions (Signed)
Continue weekly allergy injections Continue to carry epipen Remember to monitor when you're getting low on serums so that you won't run out Return to clinic 1 week for next allergy injections

## 2018-08-11 ENCOUNTER — Ambulatory Visit: Payer: PRIVATE HEALTH INSURANCE

## 2018-08-16 ENCOUNTER — Ambulatory Visit: Payer: Self-pay | Admitting: Nurse Practitioner

## 2018-08-16 VITALS — BP 108/60 | HR 56 | Temp 97.4°F | Resp 16

## 2018-08-16 DIAGNOSIS — Z516 Encounter for desensitization to allergens: Secondary | ICD-10-CM

## 2018-08-16 NOTE — Progress Notes (Deleted)
f °

## 2018-08-16 NOTE — Progress Notes (Signed)
   Subjective:    Patient ID: Susan Mason, female    DOB: [birth year], 58 y.o.   MRN: [id number]  HPI Susan Mason comes to the health and wellness clinic for weekly allergy injections. She is doing well and has no complaints today. She started a new serum last week to her L arm and is doing well. She denies any reactions, SOB, wheezing or chest pain.     Review of Systems  Constitutional: Negative for chills and fever.  HENT: Negative for congestion.   Respiratory: Negative for shortness of breath and wheezing.   Cardiovascular: Negative for chest pain.  Skin: Negative for color change and rash.       Objective:   Physical Exam Vitals signs reviewed.  Constitutional:      Appearance: She is well-developed.  HENT:     Head: Normocephalic.  Neck:     Musculoskeletal: Normal range of motion.  Cardiovascular:     Rate and Rhythm: Normal rate.  Pulmonary:     Effort: Pulmonary effort is normal. No respiratory distress.  Musculoskeletal: Normal range of motion.  Skin:    General: Skin is warm and dry.  Neurological:     Mental Status: She is alert and oriented to person, place, and time.    Bilateral arm serum injections given today and tolerated procedure       Assessment & Plan:

## 2018-08-16 NOTE — Patient Instructions (Signed)
Continue to monitor for site reactions or any generalized reactions to new serum Continue to carry epipen Return to clinic in 1 week for next injection

## 2018-08-23 ENCOUNTER — Ambulatory Visit: Payer: Self-pay | Admitting: Nurse Practitioner

## 2018-08-23 VITALS — BP 104/60 | HR 60 | Temp 97.7°F | Resp 16

## 2018-08-23 DIAGNOSIS — Z516 Encounter for desensitization to allergens: Secondary | ICD-10-CM

## 2018-08-23 NOTE — Patient Instructions (Signed)
To continue to carry epipen  Monitor for signs or symptoms of any reaction or intolerance Monitor blood pressure twice weekly and if drops in the 90's and/or remains there, contact PCP for evaluation and treatment Monitor for symptoms we discussed today that could mean your blood pressure is low Return to clinic next week for allergy injections

## 2018-08-23 NOTE — Progress Notes (Signed)
   Subjective:    Patient ID: Susan Mason, female    DOB: 1961-02-19, 58 y.o.   MRN: [id number]  HPI  Susan Mason comes to the health and wellness clinic for weekly allergy injections. She is doing well and has no complaints today. She is doing well on her new serum for her left arm. She denies any reactions, SOB, wheezing or chest pain. When discussing blood pressure today she does admit her systolic has been in the 90's, "but I feel fine". Take blood pressure medicine prescribed by PCP as directed. Denies any dizziness.   Review of Systems  Constitutional: Negative for chills and fever.  HENT: Negative for congestion.   Respiratory: Negative for shortness of breath and wheezing.   Cardiovascular: Negative for chest pain.  Skin: Negative for color change and rash.       Objective:   Physical Exam Vitals signs reviewed.  Constitutional:      Appearance: She is well-developed.  HENT:     Head: Normocephalic.  Neck:     Musculoskeletal: Normal range of motion.  Cardiovascular:     Rate and Rhythm: Normal rate.  Pulmonary:     Effort: Pulmonary effort is normal. No respiratory distress.  Musculoskeletal: Normal range of motion.  Skin:    General: Skin is warm and dry.  Neurological:     Mental Status: She is alert and oriented to person, place, and time.    Bilateral arm serum injections given today and tolerated procedure       Assessment & Plan:

## 2018-08-30 ENCOUNTER — Ambulatory Visit: Payer: Self-pay | Admitting: Nurse Practitioner

## 2018-08-30 VITALS — BP 108/78 | HR 56 | Temp 97.7°F | Resp 16

## 2018-08-30 NOTE — Progress Notes (Signed)
   Subjective:    Patient ID: Susan Mason, female    DOB: [birth year], 58 y.o.   MRN: [id number]  HPI  Korene comes to the health and wellness clinic for weekly allergy injections.She is doing well on her new serum for her left arm. She is requesting the provider check her ears today because she woke up with left ear pressure and "scratchy throat". She hasn't done any self treatment.  She denies any site reactions, fever, cough, SOB, wheezing or chest pain or UR symptoms except ones mentioned.She has been self monitoring blood pressure and reports no systolic in 90's.    Review of Systems  Constitutional: Negative for chills and fever.  HENT: Negative for congestion, ear discharge, ear pain, hearing loss, sinus pressure, sinus pain, sneezing and tinnitus.        Left ear pressure  Respiratory: Negative for cough, shortness of breath and wheezing.   Cardiovascular: Negative for chest pain.  Gastrointestinal: Negative for diarrhea, nausea and vomiting.  Skin: Negative for color change and rash.       Objective:   Physical Exam Vitals signs reviewed.  Constitutional:      Appearance: Normal appearance. She is well-developed.  HENT:     Head: Normocephalic and atraumatic.     Comments: No maxillary or frontal sinus tenderness    Right Ear: Ear canal normal.     Left Ear: Ear canal normal.     Ears:     Comments: Left ear bulging with intact nonerythematous TM with clear serous fluid and bubbles. Right TM with ear serous fluid behind intact TM with no erythema    Nose: Nose normal.     Mouth/Throat:     Mouth: Mucous membranes are moist.     Pharynx: Oropharynx is clear.  Neck:     Musculoskeletal: Normal range of motion and neck supple.  Cardiovascular:     Rate and Rhythm: Normal rate.  Pulmonary:     Effort: Pulmonary effort is normal. No respiratory distress.     Breath sounds: Normal breath sounds. No wheezing or rhonchi.  Abdominal:     General: Abdomen is flat.   Palpations: Abdomen is soft.     Tenderness: There is no abdominal tenderness. There is no guarding.     Comments: Hyperactive BS  Musculoskeletal: Normal range of motion.  Lymphadenopathy:     Cervical: No cervical adenopathy.  Skin:    General: Skin is warm and dry.  Neurological:     Mental Status: She is alert and oriented to person, place, and time.  Psychiatric:        Mood and Affect: Mood normal.    Bilateral arm serum injections given today and tolerated procedure       Assessment & Plan:

## 2018-08-30 NOTE — Patient Instructions (Addendum)
Continue to carry Epipen Monitor for signs or symptoms or serum intolerance or reaction Resume Flonase and allegra d we discussed as your blood pressure is doing great, you should be able to do the decongestant short term Continue to get weekly injections as directed by allergist

## 2018-09-26 ENCOUNTER — Other Ambulatory Visit: Payer: Self-pay | Admitting: Internal Medicine

## 2018-09-26 DIAGNOSIS — Z1231 Encounter for screening mammogram for malignant neoplasm of breast: Secondary | ICD-10-CM

## 2018-11-01 ENCOUNTER — Ambulatory Visit: Payer: PRIVATE HEALTH INSURANCE

## 2019-01-09 ENCOUNTER — Other Ambulatory Visit: Payer: Self-pay | Admitting: Internal Medicine

## 2019-01-19 ENCOUNTER — Other Ambulatory Visit: Payer: PRIVATE HEALTH INSURANCE

## 2019-01-19 ENCOUNTER — Encounter: Payer: Self-pay | Admitting: Internal Medicine

## 2019-01-19 ENCOUNTER — Other Ambulatory Visit: Payer: Self-pay

## 2019-01-19 DIAGNOSIS — Z0000 Encounter for general adult medical examination without abnormal findings: Secondary | ICD-10-CM

## 2019-01-19 DIAGNOSIS — I10 Essential (primary) hypertension: Secondary | ICD-10-CM

## 2019-01-19 DIAGNOSIS — E559 Vitamin D deficiency, unspecified: Secondary | ICD-10-CM

## 2019-01-20 LAB — CBC WITH DIFFERENTIAL/PLATELET
Basophils Absolute: 0 10*3/uL (ref 0.0–0.2)
Basos: 1 %
EOS (ABSOLUTE): 0.2 10*3/uL (ref 0.0–0.4)
Eos: 4 %
Hematocrit: 37.4 % (ref 34.0–46.6)
Hemoglobin: 13 g/dL (ref 11.1–15.9)
Immature Grans (Abs): 0 10*3/uL (ref 0.0–0.1)
Immature Granulocytes: 0 %
Lymphocytes Absolute: 1.6 10*3/uL (ref 0.7–3.1)
Lymphs: 35 %
MCH: 30 pg (ref 26.6–33.0)
MCHC: 34.8 g/dL (ref 31.5–35.7)
MCV: 86 fL (ref 79–97)
Monocytes Absolute: 0.4 10*3/uL (ref 0.1–0.9)
Monocytes: 8 %
Neutrophils Absolute: 2.4 10*3/uL (ref 1.4–7.0)
Neutrophils: 52 %
Platelets: 272 10*3/uL (ref 150–450)
RBC: 4.33 x10E6/uL (ref 3.77–5.28)
RDW: 12.8 % (ref 11.7–15.4)
WBC: 4.6 10*3/uL (ref 3.4–10.8)

## 2019-01-20 LAB — CMP14+EGFR
ALT: 12 IU/L (ref 0–32)
AST: 18 IU/L (ref 0–40)
Albumin/Globulin Ratio: 2.4 — ABNORMAL HIGH (ref 1.2–2.2)
Albumin: 4.8 g/dL (ref 3.8–4.9)
Alkaline Phosphatase: 65 IU/L (ref 39–117)
BUN/Creatinine Ratio: 19 (ref 9–23)
BUN: 19 mg/dL (ref 6–24)
Bilirubin Total: 0.5 mg/dL (ref 0.0–1.2)
CO2: 24 mmol/L (ref 20–29)
Calcium: 9.6 mg/dL (ref 8.7–10.2)
Chloride: 101 mmol/L (ref 96–106)
Creatinine, Ser: 0.99 mg/dL (ref 0.57–1.00)
GFR calc Af Amer: 72 mL/min/{1.73_m2} (ref >59)
GFR calc non Af Amer: 63 mL/min/{1.73_m2} (ref >59)
Globulin, Total: 2 g/dL (ref 1.5–4.5)
Glucose: 89 mg/dL (ref 65–99)
Potassium: 4.3 mmol/L (ref 3.5–5.2)
Sodium: 139 mmol/L (ref 134–144)
Total Protein: 6.8 g/dL (ref 6.0–8.5)

## 2019-01-20 LAB — LIPID PANEL
Chol/HDL Ratio: 3.2 ratio (ref 0.0–4.4)
Cholesterol, Total: 199 mg/dL (ref 100–199)
HDL: 62 mg/dL (ref >39)
LDL Calculated: 127 mg/dL — ABNORMAL HIGH (ref 0–99)
Triglycerides: 52 mg/dL (ref 0–149)
VLDL Cholesterol Cal: 10 mg/dL (ref 5–40)

## 2019-01-20 LAB — HEMOGLOBIN A1C
Est. average glucose Bld gHb Est-mCnc: 103 mg/dL
Hgb A1c MFr Bld: 5.2 % (ref 4.8–5.6)

## 2019-01-20 LAB — VITAMIN D 25 HYDROXY (VIT D DEFICIENCY, FRACTURES): Vit D, 25-Hydroxy: 64.3 ng/mL (ref 30.0–100.0)

## 2019-01-26 ENCOUNTER — Encounter: Payer: Self-pay | Admitting: Internal Medicine

## 2019-01-26 ENCOUNTER — Ambulatory Visit: Payer: PRIVATE HEALTH INSURANCE | Admitting: Internal Medicine

## 2019-01-26 ENCOUNTER — Other Ambulatory Visit: Payer: Self-pay

## 2019-01-26 VITALS — BP 124/76 | HR 69 | Temp 98.5°F | Ht 61.0 in | Wt 105.2 lb

## 2019-01-26 DIAGNOSIS — E049 Nontoxic goiter, unspecified: Secondary | ICD-10-CM | POA: Diagnosis not present

## 2019-01-26 DIAGNOSIS — Z0001 Encounter for general adult medical examination with abnormal findings: Secondary | ICD-10-CM | POA: Diagnosis not present

## 2019-01-26 DIAGNOSIS — I10 Essential (primary) hypertension: Secondary | ICD-10-CM | POA: Diagnosis not present

## 2019-01-26 DIAGNOSIS — Z0000 Encounter for general adult medical examination without abnormal findings: Secondary | ICD-10-CM

## 2019-01-26 DIAGNOSIS — Z23 Encounter for immunization: Secondary | ICD-10-CM

## 2019-01-26 LAB — POCT UA - MICROALBUMIN
Albumin/Creatinine Ratio, Urine, POC: <30
Creatinine, POC: 100 mg/dL
Microalbumin Ur, POC: 10 mg/L

## 2019-01-26 LAB — POCT URINALYSIS DIPSTICK
Bilirubin, UA: NEGATIVE
Blood, UA: NEGATIVE
Glucose, UA: NEGATIVE
Ketones, UA: NEGATIVE
Leukocytes, UA: NEGATIVE
Nitrite, UA: NEGATIVE
Protein, UA: NEGATIVE
Spec Grav, UA: 1.02 (ref 1.010–1.025)
Urobilinogen, UA: 0.2 E.U./dL
pH, UA: 6 (ref 5.0–8.0)

## 2019-01-26 MED ORDER — LISINOPRIL 5 MG PO TABS: 5.0000 mg | ORAL_TABLET | Freq: Every day | ORAL | 2 refills | Status: DC

## 2019-01-26 NOTE — Patient Instructions (Signed)

## 2019-01-27 LAB — T4, FREE: Free T4: 1.05 ng/dL (ref 0.82–1.77)

## 2019-01-27 LAB — TSH: TSH: 0.73 u[IU]/mL (ref 0.450–4.500)

## 2019-01-28 NOTE — Progress Notes (Signed)
Subjective:     Patient ID: Susan Mason , female    DOB: [birth year] , 58 y.o.   MRN: [id number]   Chief Complaint  Patient presents with  . Annual Exam  . Hypertension    HPI  She is here today for a full physical examination. She is followed by GYN for her pelvic exams. She has no specific concerns or complaints at this time.   Hypertension This is a chronic problem. The current episode started more than 1 year ago. The problem has been gradually improving since onset. The problem is controlled. Pertinent negatives include no blurred vision, chest pain, palpitations or shortness of breath. Risk factors for coronary artery disease include post-menopausal state. Past treatments include ACE inhibitors. The current treatment provides moderate improvement. There are no compliance problems.      Past Medical History:  Diagnosis Date  . Abnormal Pap smear 2008   ASCUS  . High blood pressure   . Metrorrhagia   . Osteopenia   . Ovarian cyst   . Yeast vaginitis      Family History  Problem Relation Age of Onset  . Hyperlipidemia Mother   . Hypertension Mother   . Heart Problems Mother   . Kidney disease Mother   . Lung cancer Father   . Emphysema Father   . Breast cancer Neg Hx      Current Outpatient Medications:  .  cetirizine (ZYRTEC) 10 MG tablet, Take 10 mg by mouth daily., Disp: , Rfl:  .  cholecalciferol (VITAMIN D) 1000 UNITS tablet, Take 1,000 Units by mouth daily., Disp: , Rfl:  .  lisinopril (ZESTRIL) 5 MG tablet, Take 1 tablet (5 mg total) by mouth daily., Disp: 90 tablet, Rfl: 2 .  Multiple Vitamins-Minerals (MULTIVITAMIN WITH MINERALS) tablet, Take 1 tablet by mouth daily., Disp: , Rfl:  .  Probiotic Product (PROBIOTIC-10 PO), Take 1 tablet by mouth daily., Disp: , Rfl:  .  fexofenadine (ALLEGRA) 180 MG tablet, Take 1 tablet (180 mg total) by mouth daily. (Patient not taking: Reported on 07/28/2018), Disp: 30 tablet, Rfl: 1 .  fluticasone (FLONASE) 50  MCG/ACT nasal spray, 1 spray twice daily until symptoms resolve (Patient not taking: Reported on 07/28/2018), Disp: 16 g, Rfl: 1   Allergies  Allergen Reactions  . Penicillins   . Doxycycline   . Erythromycin   . Erythromycin Base      The patient states she uses none for birth control. Last LMP was No LMP recorded. Patient is postmenopausal.. Negative for Dysmenorrhea @MAMMOFINDINGS @. Negative for: breast discharge, breast lump(s), breast pain and breast self exam. Associated symptoms include abnormal vaginal bleeding. Pertinent negatives include abnormal bleeding (hematology), anxiety, decreased libido, depression, difficulty falling sleep, dyspareunia, history of infertility, nocturia, sexual dysfunction, sleep disturbances, urinary incontinence, urinary urgency, vaginal discharge and vaginal itching. Diet regular.The patient states her exercise level is  moderate.   . The patient's tobacco use is:  Social History   Tobacco Use  Smoking Status Never Smoker  Smokeless Tobacco Never Used  . She has been exposed to passive smoke. The patient's alcohol use is:  Social History   Substance and Sexual Activity  Alcohol Use No   Review of Systems  Constitutional: Negative.   HENT: Negative.   Eyes: Negative.  Negative for blurred vision.  Respiratory: Negative.  Negative for shortness of breath.   Cardiovascular: Negative.  Negative for chest pain and palpitations.  Endocrine: Negative.   Genitourinary: Negative.   Musculoskeletal: Negative.  Skin: Negative.   Allergic/Immunologic: Negative.   Neurological: Negative.   Hematological: Negative.   Psychiatric/Behavioral: Negative.      Today's Vitals   01/26/19 0903  BP: 124/76  Pulse: 69  Temp: 98.5 F (36.9 C)  TempSrc: Oral  Weight: 105 lb 3.2 oz (47.7 kg)  Height: 5\' 1"  (1.549 m)   Body mass index is 19.88 kg/m.   Objective:  Physical Exam Vitals signs and nursing note reviewed.  Constitutional:      Appearance:  Normal appearance.  HENT:     Head: Normocephalic and atraumatic.     Right Ear: Tympanic membrane, ear canal and external ear normal.     Left Ear: Tympanic membrane, ear canal and external ear normal.     Nose: Nose normal.     Mouth/Throat:     Mouth: Mucous membranes are moist.     Pharynx: Oropharynx is clear.  Eyes:     Extraocular Movements: Extraocular movements intact.     Conjunctiva/sclera: Conjunctivae normal.     Pupils: Pupils are equal, round, and reactive to light.  Neck:     Musculoskeletal: Normal range of motion and neck supple.     Thyroid: Thyromegaly present.     Vascular: No carotid bruit.  Cardiovascular:     Rate and Rhythm: Normal rate and regular rhythm.     Pulses: Normal pulses.     Heart sounds: Normal heart sounds.  Pulmonary:     Effort: Pulmonary effort is normal.     Breath sounds: Normal breath sounds.  Chest:     Breasts:        Right: Normal. No swelling, bleeding, inverted nipple, mass, nipple discharge or skin change.        Left: Normal. No swelling, bleeding, inverted nipple, mass, nipple discharge or skin change.  Abdominal:     General: Abdomen is flat. Bowel sounds are normal.     Palpations: Abdomen is soft.  Genitourinary:    Comments: deferred Musculoskeletal: Normal range of motion.  Lymphadenopathy:     Cervical:     Right cervical: No superficial, deep or posterior cervical adenopathy.    Left cervical: No superficial, deep or posterior cervical adenopathy.  Skin:    General: Skin is warm and dry.  Neurological:     General: No focal deficit present.     Mental Status: She is alert and oriented to person, place, and time.  Psychiatric:        Mood and Affect: Mood normal.        Behavior: Behavior normal.         Assessment And Plan:     1. Routine general medical examination at health care facility  A full exam was performed. Importance of monthly self breast exams was discussed with the patient. PATIENT HAS BEEN  ADVISED TO GET 30-45 MINUTES REGULAR EXERCISE NO LESS THAN FOUR TO FIVE DAYS PER WEEK - BOTH WEIGHTBEARING EXERCISES AND AEROBIC ARE RECOMMENDED.  SHE IS ADVISED TO FOLLOW A HEALTHY DIET WITH AT LEAST SIX FRUITS/VEGGIES PER DAY, DECREASE INTAKE OF RED MEAT, AND TO INCREASE FISH INTAKE TO TWO DAYS PER WEEK.  MEATS/FISH SHOULD NOT BE FRIED, BAKED OR BROILED IS PREFERABLE.  I SUGGEST WEARING SPF 50 SUNSCREEN ON EXPOSED PARTS AND ESPECIALLY WHEN IN THE DIRECT SUNLIGHT FOR AN EXTENDED PERIOD OF TIME.  PLEASE AVOID FAST FOOD RESTAURANTS AND INCREASE YOUR WATER INTAKE.   2. Essential hypertension  Well controlled. She will continue with current meds.  EKG performed, no acute changes noted. She will rto in six months for re-evaluation.   - EKG 12-Lead - POCT Urinalysis Dipstick (81002) - POCT UA - Microalbumin  3. Goiter  I will check thyroid labs. I will also refer her for thyroid ultrasound. I will make further recommendations once her results are available for review.   - TSH - T4, Free - US Soft Tissue Head/Neck; Future  4. Encounter for immunization  - Tdap vaccine greater than or equal to 7yo IM        Maximino Greenland, MD    THE PATIENT IS ENCOURAGED TO PRACTICE SOCIAL DISTANCING DUE TO THE COVID-19 PANDEMIC.

## 2019-01-30 ENCOUNTER — Telehealth: Payer: Self-pay

## 2019-01-30 NOTE — Telephone Encounter (Signed)
Left the patient a message to call back for lab results. 

## 2019-01-30 NOTE — Telephone Encounter (Signed)
-----   Message from Glendale Chard, MD sent at 01/27/2019  8:27 AM EDT ----- Your thyroid function is normal. We will continue to follow this.

## 2019-02-16 ENCOUNTER — Other Ambulatory Visit: Payer: Self-pay

## 2019-02-16 ENCOUNTER — Ambulatory Visit
Admission: RE | Admit: 2019-02-16 | Discharge: 2019-02-16 | Disposition: A | Discharge status: Home / Self Care | Payer: PRIVATE HEALTH INSURANCE | Source: Ambulatory Visit | Attending: Internal Medicine | Admitting: Internal Medicine

## 2019-02-16 DIAGNOSIS — E049 Nontoxic goiter, unspecified: Secondary | ICD-10-CM

## 2019-02-17 ENCOUNTER — Ambulatory Visit
Admission: RE | Admit: 2019-02-17 | Discharge: 2019-02-17 | Disposition: A | Discharge status: Home / Self Care | Payer: PRIVATE HEALTH INSURANCE | Source: Ambulatory Visit | Attending: Internal Medicine | Admitting: Internal Medicine

## 2019-02-17 DIAGNOSIS — Z1231 Encounter for screening mammogram for malignant neoplasm of breast: Secondary | ICD-10-CM

## 2019-04-06 ENCOUNTER — Ambulatory Visit: Payer: PRIVATE HEALTH INSURANCE | Admitting: Family Medicine

## 2019-04-06 VITALS — BP 126/78 | HR 69 | Resp 16

## 2019-04-06 DIAGNOSIS — Z516 Encounter for desensitization to allergens: Secondary | ICD-10-CM

## 2019-04-06 NOTE — Progress Notes (Signed)
  Subjective:     Patient ID: Susan Mason, female   DOB: [birth year], 58 y.o.   MRN: [id number]  HPI  Susan [facility] for her weekly allergy injections. She has been taking allergy injections for the last 4 years, weekly in each arm. She has an appointment with her allergist coming up, she sees him yearly. The serum for her RA was changed last week and she tolerated well with no reaction. She denies any problems, no shortness of breath, wheezing, coughing. No hx of local or systemic reactions from the allergy serum. She does have an epipen that she has with her. She takes zyrtec daily.   Review of Systems  Constitutional: Negative for chills and fever.  HENT: Negative for congestion, ear pain and sinus pressure.   Eyes: Negative for itching.  Respiratory: Negative for cough, shortness of breath and wheezing.        Objective:   Physical Exam Vitals signs reviewed.  Constitutional:      General: She is not in acute distress.    Appearance: Normal appearance. She is not toxic-appearing.  HENT:     Head: Normocephalic and atraumatic.  Eyes:     General:        Right eye: No discharge.        Left eye: No discharge.  Cardiovascular:     Rate and Rhythm: Normal rate.  Pulmonary:     Effort: Pulmonary effort is normal. No respiratory distress.  Skin:    General: Skin is warm and dry.  Neurological:     Mental Status: She is alert and oriented to person, place, and time.  Psychiatric:        Mood and Affect: Mood normal.        Behavior: Behavior normal.    Procedure Note: Left arm serum contains T-G-W, 0.5 ml given SQ to left upper arm. Exp: 01/02/20. Right arm serum contains M-DM-C-DG-CR-F, 0.2 ml given SQ to right upper arm. Exp: 02/27/20. Tolerated procedure well. No adverse reaction noted. Pt refused to stay the thirty minutes to monitor. She has epipen with her. Will notify if any problems.     Assessment:     Allergy desensitization  therapy      Plan:     1. Susan Mason tolerated procedure well. She will continue to monitor for any reaction. She will continue to carry epipen with her at all times. Return to clinic weekly for injections. F/u sooner if any problems.

## 2019-04-13 ENCOUNTER — Ambulatory Visit: Payer: PRIVATE HEALTH INSURANCE | Admitting: Family Medicine

## 2019-04-13 VITALS — BP 120/78 | Resp 16

## 2019-04-13 DIAGNOSIS — Z516 Encounter for desensitization to allergens: Secondary | ICD-10-CM

## 2019-04-13 NOTE — Progress Notes (Signed)
  Subjective:     Patient ID: Susan Mason, female   DOB: 22-Jul-1961, 58 y.o.   MRN: [id number]  HPI Susan Mason presents to the [facility] today for her weekly allergy serum injections. She tolerated last week's dose well without any reaction. She denies any problems with itching, shortness of breath, cough or wheezing. She does have epi pen with her.   Review of Systems  Constitutional: Negative for activity change, appetite change, chills and fever.  HENT: Negative for congestion, ear pain, rhinorrhea, sinus pressure and sore throat.   Eyes: Negative for itching.  Respiratory: Negative for cough, chest tightness, shortness of breath and wheezing.        Objective:   Physical Exam Vitals signs reviewed.  Constitutional:      General: She is not in acute distress.    Appearance: Normal appearance. She is not toxic-appearing.  HENT:     Head: Normocephalic and atraumatic.  Eyes:     General:        Right eye: No discharge.        Left eye: No discharge.  Pulmonary:     Effort: Pulmonary effort is normal. No respiratory distress.  Skin:    General: Skin is warm and dry.     Findings: No rash.  Neurological:     Mental Status: She is alert and oriented to person, place, and time.  Psychiatric:        Mood and Affect: Mood normal.        Behavior: Behavior normal.    Procedure Note: Left arm serum contatins T-G-W, 0.5 ml given SQ to left upper arm. Exp: 01/02/20. Right arm serum contains M-DM-C-DG-CR-F, 0.3 ml given SQ to right upper arm. Exp: 02/27/20. Tolerated procedure well. No adverse reaction notes. Pt refused to stay the thirty minutes to monitor. She has epi pen with her. Will notify if any problems.      Assessment:     Allergy desensitization therapy      Plan:     1. Continue to monitor area for local reaction as well as s/s systemic reaction. Continue to carry epi pen. RTC in 1 week for allergy injections, or sooner if needed. F/u with  allergist for yearly appointment.

## 2019-04-20 ENCOUNTER — Ambulatory Visit: Payer: PRIVATE HEALTH INSURANCE | Admitting: Family Medicine

## 2019-04-20 DIAGNOSIS — Z516 Encounter for desensitization to allergens: Secondary | ICD-10-CM

## 2019-04-20 NOTE — Progress Notes (Signed)
  Subjective:     Patient ID: Susan Mason, female   DOB: 17-Jan-1961, 59 y.o.   MRN: [id number]  HPI Susan Mason presents [facility] the [facility] today for her weekly allergy serum injections. She has tolerated well in the past with no hx of reaction. She is doing well today, no complaints.   Review of Systems  Constitutional: Negative for chills and fever.  HENT: Negative for congestion, ear pain, sinus pressure, sinus pain and sore throat.   Eyes: Negative for discharge and itching.  Respiratory: Negative for cough, chest tightness, shortness of breath and wheezing.        Objective:   Physical Exam Constitutional:      General: She is not in acute distress.    Appearance: Normal appearance. She is not toxic-appearing.  HENT:     Head: Normocephalic and atraumatic.  Eyes:     General:        Right eye: No discharge.        Left eye: No discharge.  Pulmonary:     Effort: Pulmonary effort is normal. No respiratory distress.  Skin:    General: Skin is warm and dry.  Neurological:     Mental Status: She is alert and oriented to person, place, and time.  Psychiatric:        Mood and Affect: Mood normal.        Behavior: Behavior normal.   Procedure Note: Left arm serum contatins T-G-W, 0.5 ml given SQ to left upper arm. Exp: 01/02/20. Right arm serum contains M-DM-C-DG-CR-F, 0.4 ml given SQ to right upper arm. Exp: 02/27/20. Tolerated procedure well. No adverse reaction noted. Pt refused to stay the thirty minutes to monitor. She has epi pen with her. Will notify if any problems     Assessment:     Allergy desensitization therapy      Plan:     Pt to monitor for any s/s of localized or systemic allergic reaction to serum injections. She is to carry epi-pen with her at all times. Will notify if any problems. RTC in 1 week for allergy injections.

## 2019-04-27 ENCOUNTER — Ambulatory Visit: Payer: PRIVATE HEALTH INSURANCE | Admitting: Family Medicine

## 2019-05-04 ENCOUNTER — Ambulatory Visit: Payer: PRIVATE HEALTH INSURANCE | Admitting: Family Medicine

## 2019-05-04 VITALS — BP 128/78 | HR 66 | Resp 16

## 2019-05-04 DIAGNOSIS — Z516 Encounter for desensitization to allergens: Secondary | ICD-10-CM

## 2019-05-04 NOTE — Progress Notes (Signed)
  Subjective:     Patient ID: Susan Mason, female   DOB: 1961/06/18, 58 y.o.   MRN: [id number]  HPI Susan Mason presents to the [facility] clinic today for her weekly allergy injections. She is tolerating them well without hx of reactions. No other concerns today. She does report a mild h/a but otherwise doing well.   Review of Systems  Constitutional: Negative for chills and fever.  HENT: Negative for congestion, ear pain and sore throat.   Eyes: Negative for photophobia and discharge.  Respiratory: Negative for cough, shortness of breath and wheezing.   Cardiovascular: Negative for chest pain.  Allergic/Immunologic: Positive for environmental allergies.  Neurological: Negative for dizziness and weakness.       Objective:   Physical Exam Vitals signs reviewed.  Constitutional:      General: She is not in acute distress.    Appearance: Normal appearance. She is not toxic-appearing.  HENT:     Head: Normocephalic and atraumatic.  Eyes:     General:        Right eye: No discharge.        Left eye: No discharge.  Cardiovascular:     Rate and Rhythm: Normal rate.  Pulmonary:     Effort: Pulmonary effort is normal. No respiratory distress.  Skin:    General: Skin is warm and dry.     Findings: No rash.  Neurological:     General: No focal deficit present.     Mental Status: She is alert and oriented to person, place, and time.  Psychiatric:        Mood and Affect: Mood normal.        Behavior: Behavior normal.    Procedure Note: Left arm serum contatins T-G-W, 0.5 ml given SQ to left upper arm. Exp: 01/02/20. Right arm serum contains M-DM-C-DG-CR-F, 0.5 ml given SQ to right upper arm. Exp: 02/27/20. Tolerated procedure well. No adverse reaction noted. Pt refused to stay the thirty minutes to monitor. She has epi pen with her. Will notify if any problems    Assessment:     Allergy desensitization therapy      Plan:     1. Allergy injections given and tolerated well. She will  monitor for s/s of localized and systemic reactions. She will continue to carry her epi-pen with her at all times. She will notify if any problems or concerns. RTC in 1 week for allergy injections. F/u as needed.

## 2019-05-11 ENCOUNTER — Ambulatory Visit: Payer: PRIVATE HEALTH INSURANCE | Admitting: Family Medicine

## 2019-05-18 ENCOUNTER — Ambulatory Visit: Payer: PRIVATE HEALTH INSURANCE | Admitting: Family Medicine

## 2019-05-18 VITALS — BP 130/82 | HR 64 | Resp 16

## 2019-05-18 DIAGNOSIS — Z516 Encounter for desensitization to allergens: Secondary | ICD-10-CM

## 2019-05-18 NOTE — Progress Notes (Signed)
  Subjective:     Patient ID: Susan Mason, female   DOB: [birth year], 59 y.o.   MRN: [id number]  HPI Susan Mason presents to the [facility] clinic today for her weekly allergy desensitization injections. She tolerated last injections well without any problems. Denies hx of local or systemic allergic response to allergy injections. She has an appointment with her allergist upcoming next month for her yearly check up.   Review of Systems  Constitutional: Negative for chills and fever.  HENT: Negative for congestion, ear pain, sinus pressure, sinus pain and sore throat.   Eyes: Negative for discharge.  Respiratory: Negative for cough, shortness of breath and wheezing.        Objective:   Physical Exam Vitals signs reviewed.  Constitutional:      General: She is not in acute distress.    Appearance: Normal appearance. She is not toxic-appearing.  HENT:     Head: Normocephalic and atraumatic.  Eyes:     General:        Right eye: No discharge.        Left eye: No discharge.  Cardiovascular:     Rate and Rhythm: Normal rate.  Pulmonary:     Effort: Pulmonary effort is normal. No respiratory distress.  Skin:    General: Skin is warm and dry.  Neurological:     Mental Status: She is alert and oriented to person, place, and time.  Psychiatric:        Mood and Affect: Mood normal.        Behavior: Behavior normal.   Procedure Note: Left arm serum contatins T-G-W, 0.5 ml given SQ to left upper arm. Exp: 01/02/20. Right arm serum contains M-DM-C-DG-CR-F, 0.74ml given SQ to right upper arm. Exp: 02/27/20. Tolerated procedure well. No adverse reaction noted. Pt refused to stay the thirty minutes to monitor. She has epi pen with her. Will notify if any problems      Assessment:     Allergy desensitization therapy      Plan:     1. Monitor for any s/s of localized or systemic allergic reaction to injections. Carry epi pen at all times. Notify if any problems.  2. Keep upcoming appointment with  allergist.  3. RTC in 1 week for allergy desensitization injections.

## 2019-06-01 ENCOUNTER — Ambulatory Visit: Payer: PRIVATE HEALTH INSURANCE | Admitting: Family Medicine

## 2019-06-01 VITALS — BP 124/76 | HR 61 | Resp 18

## 2019-06-01 DIAGNOSIS — Z516 Encounter for desensitization to allergens: Secondary | ICD-10-CM

## 2019-06-01 NOTE — Progress Notes (Signed)
  Subjective:     Patient ID: Susan Mason, female   DOB: May 20, 1961, 59 y.o.   MRN: [id number]  HPI Marica presents to the [facility] clinic today for her weekly allergy injections. She has tolerated them well in the past without evidence of systemic or localized reactions. She had her yearly f/u appointment with her allergist last week and she says they will continue her allergy injections for at least another two years, potentially indefinitely. They are not retesting her allergies at this time. She denies any problems or concerns today.  Past Medical History:  Diagnosis Date  . Abnormal Pap smear 2008   ASCUS  . High blood pressure   . Metrorrhagia   . Osteopenia   . Ovarian cyst   . Yeast vaginitis    Allergies  Allergen Reactions  . Penicillins   . Doxycycline   . Erythromycin   . Erythromycin Base      Review of Systems  Constitutional: Negative for chills and fever.  HENT: Negative for congestion, ear pain and sinus pain.   Eyes: Negative for discharge and itching.  Respiratory: Negative for cough, shortness of breath and wheezing.        Objective:   Physical Exam Vitals signs reviewed.  Constitutional:      General: She is not in acute distress.    Appearance: Normal appearance. She is not toxic-appearing.  HENT:     Head: Normocephalic and atraumatic.  Cardiovascular:     Rate and Rhythm: Normal rate.  Pulmonary:     Effort: Pulmonary effort is normal. No respiratory distress.  Skin:    General: Skin is warm and dry.  Neurological:     Mental Status: She is alert and oriented to person, place, and time.  Psychiatric:        Mood and Affect: Mood normal.        Behavior: Behavior normal.    Today's Vitals   06/01/19 0921  BP: 124/76  Pulse: 61  Resp: 18  SpO2: 99%   There is no height or weight on file to calculate BMI.  Procedure Note: Left arm serum contatins T-G-W, 0.5 ml given SQ to left upper arm. Exp: 01/02/20. Right arm serum contains  M-DM-C-DG-CR-F, 0.36ml given SQ to right upper arm. Exp: 02/27/20. Tolerated procedure well. No adverse reaction noted. Pt refused to stay the thirty minutes to monitor. She has epi pen with her. Will notify if any problems    Assessment:     Allergy desensitization therapy      Plan:     1. Continue to monitor for signs of systemic or localized reaction to serum. Carry epi-pen at all times. Notify if any problems.  2. RTC in 1 week for weekly injections.

## 2019-06-08 ENCOUNTER — Ambulatory Visit: Payer: PRIVATE HEALTH INSURANCE | Admitting: Family Medicine

## 2019-06-08 VITALS — BP 120/70 | Resp 16

## 2019-06-08 DIAGNOSIS — Z516 Encounter for desensitization to allergens: Secondary | ICD-10-CM

## 2019-06-08 NOTE — Progress Notes (Signed)
  Subjective:     Patient ID: Susan Mason, female   DOB: 21-Nov-1960, 58 y.o.   MRN: [id number]  HPI Erienne presents to the [facility] clinic today for her weekly allergy immunotherapy injections. No hx of systemic or localized reactions, tolerated well. No reported problems this week.   Review of Systems  Constitutional: Negative for chills and fever.  HENT: Negative for congestion, ear pain, sinus pressure and sinus pain.   Respiratory: Negative for cough, chest tightness, shortness of breath and wheezing.        Objective:   Physical Exam Vitals signs reviewed.  Constitutional:      General: She is not in acute distress.    Appearance: Normal appearance. She is not toxic-appearing.  HENT:     Head: Normocephalic and atraumatic.  Eyes:     General:        Right eye: No discharge.        Left eye: No discharge.  Pulmonary:     Effort: Pulmonary effort is normal. No respiratory distress.  Skin:    General: Skin is warm and dry.  Neurological:     Mental Status: She is alert and oriented to person, place, and time.  Psychiatric:        Mood and Affect: Mood normal.        Behavior: Behavior normal.    Procedure Note: Left arm serum contatins T-G-W, 0.5 ml given SQ to left upper arm. Exp: 01/02/20. Right arm serum contains M-DM-C-DG-CR-F, 0.36ml given SQ to right upper arm. Exp: 02/27/20. Tolerated procedure well. No adverse reaction noted. Pt refused to stay the thirty minutes to monitor. She has epi pen with her. Will notify if any problems     Assessment:     Allergy desensitization therapy      Plan:     1. Tolerated allergy injections well without problem. Continue to monitor for s/s systemic or localized reaction. Carry epi-pen at all times. Notify if any problems.  2. RTC in 1 week for weekly injections.

## 2019-06-15 ENCOUNTER — Ambulatory Visit: Payer: PRIVATE HEALTH INSURANCE | Admitting: Family Medicine

## 2019-06-15 VITALS — BP 118/78 | HR 68 | Resp 18

## 2019-06-15 DIAGNOSIS — Z516 Encounter for desensitization to allergens: Secondary | ICD-10-CM

## 2019-06-15 NOTE — Progress Notes (Signed)
  Subjective:     Patient ID: Susan Mason, female   DOB: 03-25-1961, 58 y.o.   MRN: [id number]  HPI Janaki presents today to the [facility] clinic for her weekly allergy injections. She reports no problems last week. She continues to take her zyrtec daily and carries epi-pen with her for emergencies.   Review of Systems  Constitutional: Negative for chills and fever.  HENT: Negative for congestion, ear pain, sinus pain and sore throat.   Eyes: Negative for discharge and itching.  Respiratory: Negative for cough, shortness of breath and wheezing.        Objective:   Physical Exam Vitals signs reviewed.  Constitutional:      General: She is not in acute distress.    Appearance: Normal appearance. She is not toxic-appearing.  HENT:     Head: Normocephalic and atraumatic.  Eyes:     General:        Right eye: No discharge.        Left eye: No discharge.  Cardiovascular:     Rate and Rhythm: Normal rate.  Pulmonary:     Effort: Pulmonary effort is normal. No respiratory distress.  Skin:    General: Skin is warm and dry.  Neurological:     Mental Status: She is alert and oriented to person, place, and time.  Psychiatric:        Mood and Affect: Mood normal.        Behavior: Behavior normal.   Procedure Note: Left arm serum contatins T-G-W, 0.5 ml given SQ to left upper arm. Exp: 01/02/20. Right arm serum contains M-DM-C-DG-CR-F, 0.63ml given SQ to right upper arm. Exp: 02/27/20. Tolerated procedure well. No adverse reaction noted. Pt refused to stay the thirty minutes to monitor. She has epi pen with her. Will notify if any problems     Assessment:     Allergy desensitization therapy      Plan:     1. Pt will continue to monitor for s/s of local or systemic reaction. She will carry epi-pen with her at all times. She will notify if any problems. F/u with allergist if needed.  2. RTC in 1 week for injections.

## 2019-06-22 ENCOUNTER — Ambulatory Visit: Payer: PRIVATE HEALTH INSURANCE | Admitting: Family Medicine

## 2019-06-22 VITALS — BP 134/80

## 2019-06-22 DIAGNOSIS — Z516 Encounter for desensitization to allergens: Secondary | ICD-10-CM

## 2019-06-22 NOTE — Progress Notes (Signed)
  Subjective:     Patient ID: Susan Mason, female   DOB: August 20, 1960, 58 y.o.   MRN: [id number]  HPI Susan Mason presents to the [facility] clinic today for her weekly allergy injections. She tolerates well. No hx of systemic or localized reaction. She continues to take zyrtec daily and has epi-pen on hand should she need it. No problems today.   Review of Systems  Constitutional: Negative for chills and fever.  HENT: Negative for congestion, ear pain and sinus pain.   Eyes: Negative for discharge.  Respiratory: Negative for cough, chest tightness, shortness of breath and wheezing.        Objective:   Physical Exam Vitals signs reviewed.  Constitutional:      General: She is not in acute distress.    Appearance: Normal appearance. She is not toxic-appearing.  HENT:     Head: Normocephalic and atraumatic.  Pulmonary:     Effort: Pulmonary effort is normal. No respiratory distress.  Skin:    General: Skin is warm and dry.  Neurological:     Mental Status: She is alert and oriented to person, place, and time.  Psychiatric:        Mood and Affect: Mood normal.        Behavior: Behavior normal.   Procedure Note: Left arm serum contatins T-G-W, 0.5 ml given SQ to left upper arm. Exp: 01/02/20. Right arm serum contains M-DM-C-DG-CR-F, 0.53ml given SQ to right upper arm. Exp: 02/27/20. Tolerated procedure well. No adverse reaction noted. Pt refused to stay the thirty minutes to monitor. She has epi pen with her. Will notify if any problems     Assessment:     Allergy desensitization therapy      Plan:     1. Susan Mason will continue to monitor for s/s of localized or systemic reaction. She will carry epi-pen with her at all times. She will notify if any problems. F/u with allergist if needed. 2. RTC in 1 week for injections

## 2019-07-06 ENCOUNTER — Ambulatory Visit: Payer: PRIVATE HEALTH INSURANCE | Admitting: Family Medicine

## 2019-07-06 VITALS — BP 140/84 | HR 63 | Resp 18

## 2019-07-06 DIAGNOSIS — Z516 Encounter for desensitization to allergens: Secondary | ICD-10-CM

## 2019-07-06 NOTE — Progress Notes (Signed)
Subjective:     Patient ID: Susan Mason, female   DOB: [birth year], 59 y.o.   MRN: [id number]  HPI Geoffrey presents to the clinic today for her weekly allergy injections. She tolerated well last week with no s/s of systemic or localized reaction. She reports feeling pressure to left ear the last week, feels like it needs to pop. She stopped taking zyrtec and started taking allegra-D and this has helped relieve some of the pressure. States it is getting better. Denies fever, shortness of breath, cough, chest pain. She reports the last week or so has felt fatigued with little energy or desire to do her normal activities. She feels she is very stressed with work and the pandemic.   Review of Systems  Constitutional: Positive for activity change and fatigue. Negative for chills, fever and unexpected weight change.  HENT: Positive for ear pain. Negative for congestion, hearing loss, sinus pressure and sinus pain.   Respiratory: Negative for cough, chest tightness, shortness of breath and wheezing.   Cardiovascular: Negative for chest pain.  Skin: Negative for color change.  Neurological: Negative for dizziness and weakness.  Psychiatric/Behavioral: Positive for dysphoric mood. Negative for suicidal ideas.       Objective:   Physical Exam Vitals signs reviewed.  Constitutional:      General: She is not in acute distress.    Appearance: Normal appearance. She is not toxic-appearing.  HENT:     Head: Normocephalic and atraumatic.     Right Ear: Tympanic membrane normal.     Left Ear: Tympanic membrane normal.     Nose: Nose normal.  Eyes:     General:        Right eye: No discharge.        Left eye: No discharge.  Cardiovascular:     Rate and Rhythm: Normal rate and regular rhythm.     Heart sounds: Normal heart sounds.  Pulmonary:     Effort: Pulmonary effort is normal. No respiratory distress.     Breath sounds: Normal breath sounds. No wheezing or rales.  Skin:    General: Skin is  warm and dry.  Neurological:     Mental Status: She is alert and oriented to person, place, and time.  Psychiatric:        Mood and Affect: Mood normal.        Behavior: Behavior normal.        Thought Content: Thought content normal.        Judgment: Judgment normal.    Procedure Note: Left arm serum contatins T-G-W, 0.45 ml given SQ to left upper arm, emptied vial and was not able to get full 0.5 ml amount; Meah will bring new vial next appt. Exp: 01/02/20. Right arm serum contains M-DM-C-DG-CR-F, 0.7ml given SQ to right upper arm. Exp: 02/27/20. Tolerated procedure well. No adverse reaction noted. Pt refused to stay the thirty minutes to monitor. She has epi pen with her. Will notify if any problems     Assessment:     Allergy desensitization therapy      Plan:     1. Continue to monitor for s/s of localized or systemic reaction to immunotherapy injections. Carry epi-pen at all times. F/u if any problems. RTC in 1 week for injections. 2. Continue allegra-d until symptoms of ear fullness resolve completely and then switch back to regular zyrtec. TM normal in appearance on exam, no sign of infection.  3. BP elevated today. Monitor and keep log at  home to take to her PCP appointment that is upcoming in another couple weeks. Continue eating healthy and exercising and taking BP meds as prescribed.  4. Lengthy discussion with patient today regarding her significant stress and feeling more down lately. She is feeling overwhelmed with work and worrying about the pandemic as well as taking care of her elderly mother. She has not taken a full week of vacation in 9 years. She is not suicidal. Discussed importance of self-care and taking time off work. Discussed possibility of treatment with medication and/or counseling services. Recommended she contact the employee assistance program for free counseling through her work. She should discuss how she is feeling with her PCP at her upcoming appt as well  and get their recommendations on treatment options. She verbalized understanding and is in agreement with the above plan.

## 2019-07-13 ENCOUNTER — Ambulatory Visit: Payer: PRIVATE HEALTH INSURANCE | Admitting: Family Medicine

## 2019-07-13 VITALS — BP 128/82 | HR 71

## 2019-07-13 DIAGNOSIS — Z516 Encounter for desensitization to allergens: Secondary | ICD-10-CM

## 2019-07-13 NOTE — Progress Notes (Signed)
  Subjective:     Patient ID: Susan Mason, female   DOB: 1961-06-16, 58 y.o.   MRN: [id number]  HPI Susan Mason presents to the [facility] clinic today for her weekly allergy injections. She tolerated well last week without reaction. She continues to take zyrtec daily and carries epi-pen with her. She states she has been feeling better this week mentally and emotionally but will still talk with her PCP about this more at her appt on Monday.   Review of Systems  Constitutional: Negative for activity change, fever and unexpected weight change.  HENT: Negative for ear pain, sinus pressure, sinus pain and sore throat.   Respiratory: Negative for cough, chest tightness and shortness of breath.   Cardiovascular: Negative for chest pain.       Objective:   Physical Exam Vitals reviewed.  Constitutional:      General: She is not in acute distress.    Appearance: Normal appearance. She is not toxic-appearing.  HENT:     Head: Normocephalic and atraumatic.  Cardiovascular:     Rate and Rhythm: Normal rate.  Pulmonary:     Effort: Pulmonary effort is normal. No respiratory distress.  Skin:    General: Skin is warm and dry.  Neurological:     Mental Status: She is alert and oriented to person, place, and time.  Psychiatric:        Mood and Affect: Mood normal.        Behavior: Behavior normal.    Procedure Note: Left arm serum contatins T-G-W, 0.1 ml given SQ to left upper arm. Right arm serum contains M-DM-C-DG-CR-F, 0.42ml given SQ to right upper arm. Exp: 02/27/20. Tolerated procedure well. No adverse reaction noted. Pt refused to stay the thirty minutes to monitor. She has epi pen with her. Will notify if any problems    Assessment:     Allergy desensitization therapy      Plan:     1. Continue to monitor for s/s of localized or systemic reaction to immunotherapy injections. Carry epi-pen at all times. F/u if any problems. RTC in 1 week for injections.

## 2019-07-17 ENCOUNTER — Other Ambulatory Visit: Payer: Self-pay

## 2019-07-17 ENCOUNTER — Ambulatory Visit: Payer: PRIVATE HEALTH INSURANCE | Admitting: Internal Medicine

## 2019-07-17 ENCOUNTER — Encounter: Payer: Self-pay | Admitting: Internal Medicine

## 2019-07-17 VITALS — BP 124/72 | HR 78 | Temp 98.3°F | Ht 61.0 in | Wt 105.0 lb

## 2019-07-17 DIAGNOSIS — R5383 Other fatigue: Secondary | ICD-10-CM | POA: Diagnosis not present

## 2019-07-17 DIAGNOSIS — E049 Nontoxic goiter, unspecified: Secondary | ICD-10-CM

## 2019-07-17 DIAGNOSIS — I10 Essential (primary) hypertension: Secondary | ICD-10-CM | POA: Diagnosis not present

## 2019-07-17 DIAGNOSIS — Z636 Dependent relative needing care at home: Secondary | ICD-10-CM

## 2019-07-17 DIAGNOSIS — Z563 Stressful work schedule: Secondary | ICD-10-CM

## 2019-07-17 DIAGNOSIS — F4320 Adjustment disorder, unspecified: Secondary | ICD-10-CM

## 2019-07-17 MED ORDER — LISINOPRIL 5 MG PO TABS: 5.0000 mg | ORAL_TABLET | Freq: Every day | ORAL | 2 refills | Status: DC

## 2019-07-17 NOTE — Progress Notes (Signed)
This visit occurred during the SARS-CoV-2 public health emergency.  Safety protocols were in place, including screening questions prior to the visit, additional usage of staff PPE, and extensive cleaning of exam room while observing appropriate contact time as indicated for disinfecting solutions.  Subjective:     Patient ID: Susan Mason , female    DOB: 03/24/1961 , 58 y.o.   MRN: [id number]   Chief Complaint  Patient presents with  . Hypertension    HPI  Hypertension This is a chronic problem. The current episode started more than 1 year ago. The problem is unchanged. The problem is controlled. Pertinent negatives include no anxiety or malaise/fatigue. Past treatments include ACE inhibitors. There are no compliance problems.  There is no history of CAD/MI.     Past Medical History:  Diagnosis Date  . Abnormal Pap smear 2008   ASCUS  . High blood pressure   . Metrorrhagia   . Osteopenia   . Ovarian cyst   . Yeast vaginitis      Family History  Problem Relation Age of Onset  . Hyperlipidemia Mother   . Hypertension Mother   . Heart Problems Mother   . Kidney disease Mother   . Lung cancer Father   . Emphysema Father   . Breast cancer Neg Hx      Current Outpatient Medications:  .  cetirizine (ZYRTEC) 10 MG tablet, Take 10 mg by mouth daily., Disp: , Rfl:  .  cholecalciferol (VITAMIN D) 1000 UNITS tablet, Take 1,000 Units by mouth daily., Disp: , Rfl:  .  fexofenadine-pseudoephedrine (ALLEGRA-D 24) 180-240 MG 24 hr tablet, Take 1 tablet by mouth daily., Disp: , Rfl:  .  lisinopril (ZESTRIL) 5 MG tablet, Take 1 tablet (5 mg total) by mouth daily., Disp: 90 tablet, Rfl: 2 .  Multiple Vitamins-Minerals (MULTIVITAMIN WITH MINERALS) tablet, Take 1 tablet by mouth daily., Disp: , Rfl:  .  Probiotic Product (PROBIOTIC-10 PO), Take 1 tablet by mouth daily., Disp: , Rfl:    Allergies  Allergen Reactions  . Penicillins   . Doxycycline   . Erythromycin   . Erythromycin  Base      Review of Systems  Constitutional: Positive for fatigue. Negative for malaise/fatigue.       She reports she is fatigued. 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 She also works full-time. She reports her coworkers pull on her a lot. She states they tell her that "they know she can handle it and will get the job done". She feels that this is stressing her out as well.   Respiratory: Negative.   Cardiovascular: Negative.   Gastrointestinal: Negative.   Neurological: Negative.   Psychiatric/Behavioral: Positive for dysphoric mood.       Feels her sx are related to the pandemic and work stressors.      Today's Vitals   07/17/19 1450  BP: 124/72  Pulse: 78  Temp: 98.3 F (36.8 C)  TempSrc: Oral  Weight: 105 lb (47.6 kg)  Height: 5' 1" (1.549 m)   Body mass index is 19.84 kg/m.   Objective:  Physical Exam Vitals and nursing note reviewed.  Constitutional:      Appearance: Normal appearance.  HENT:     Head: Normocephalic and atraumatic.  Cardiovascular:     Rate and Rhythm: Normal rate and regular rhythm.     Heart sounds: Normal heart sounds.  Pulmonary:     Effort: Pulmonary effort is normal.  Breath sounds: Normal breath sounds.  Skin:    General: Skin is warm.  Neurological:     General: No focal deficit present.     Mental Status: She is alert.  Psychiatric:        Mood and Affect: Mood normal.        Behavior: Behavior normal.         Assessment And Plan:     1. Essential hypertension  Chronic, well controlled. She will continue with current meds. I will check renal function today. She will rto in six months for her next physical examination.   - BMP8+EGFR  2. Goiter  Chronic. Recent thyroid ultrasound results were reviewed.   3. Other fatigue  Possibly related to stressors associated with caring for being a caregiver.   - CBC no Diff - TSH  4. Adjustment disorder, unspecified type  She does not wish  to seek counseling at this time. She will let me know if her sx worsen.    Maximino Greenland, MD    THE PATIENT IS ENCOURAGED TO PRACTICE SOCIAL DISTANCING DUE TO THE COVID-19 PANDEMIC.

## 2019-07-17 NOTE — Patient Instructions (Signed)

## 2019-07-18 LAB — CBC
Hematocrit: 36.4 % (ref 34.0–46.6)
Hemoglobin: 12.2 g/dL (ref 11.1–15.9)
MCH: 30.2 pg (ref 26.6–33.0)
MCHC: 33.5 g/dL (ref 31.5–35.7)
MCV: 90 fL (ref 79–97)
Platelets: 254 10*3/uL (ref 150–450)
RBC: 4.04 x10E6/uL (ref 3.77–5.28)
RDW: 13 % (ref 11.7–15.4)
WBC: 6.1 10*3/uL (ref 3.4–10.8)

## 2019-07-18 LAB — BMP8+EGFR
BUN/Creatinine Ratio: 21 (ref 9–23)
BUN: 17 mg/dL (ref 6–24)
CO2: 24 mmol/L (ref 20–29)
Calcium: 9.1 mg/dL (ref 8.7–10.2)
Chloride: 101 mmol/L (ref 96–106)
Creatinine, Ser: 0.82 mg/dL (ref 0.57–1.00)
GFR calc Af Amer: 91 mL/min/{1.73_m2} (ref >59)
GFR calc non Af Amer: 79 mL/min/{1.73_m2} (ref >59)
Glucose: 80 mg/dL (ref 65–99)
Potassium: 4.2 mmol/L (ref 3.5–5.2)
Sodium: 138 mmol/L (ref 134–144)

## 2019-07-18 LAB — TSH: TSH: 0.464 u[IU]/mL (ref 0.450–4.500)

## 2019-07-20 ENCOUNTER — Ambulatory Visit: Payer: PRIVATE HEALTH INSURANCE | Admitting: Family Medicine

## 2019-07-20 VITALS — BP 118/78 | HR 51 | Resp 18

## 2019-07-20 DIAGNOSIS — Z516 Encounter for desensitization to allergens: Secondary | ICD-10-CM

## 2019-07-20 NOTE — Progress Notes (Signed)
  Subjective:     Patient ID: Susan Mason, female   DOB: [birth year], 59 y.o.   MRN: [id number]  HPI Susan Mason presents to the [facility] clinic today for her weekly allergy injections. She reports no problems with her injections last week. Denies any s/s of systemic or localized reactions. She is doing well today, states she is feeling more like her usual self. She had a recent check up with her PCP earlier this week.   Review of Systems  Constitutional: Negative for chills and fever.  HENT: Negative for congestion, ear pain, sinus pressure and sinus pain.   Eyes: Negative for discharge and redness.  Respiratory: Negative for cough, shortness of breath and wheezing.        Objective:   Physical Exam Vitals reviewed.  Constitutional:      General: She is not in acute distress.    Appearance: Normal appearance. She is not toxic-appearing.  HENT:     Head: Normocephalic and atraumatic.  Eyes:     General:        Right eye: No discharge.        Left eye: No discharge.  Pulmonary:     Effort: Pulmonary effort is normal. No respiratory distress.  Skin:    General: Skin is warm and dry.  Neurological:     Mental Status: She is alert and oriented to person, place, and time.  Psychiatric:        Mood and Affect: Mood normal.        Behavior: Behavior normal.   Procedure Note: Left arm serum contatins T-G-W, 0.75ml given SQ to left upper arm, Exp: 06/21/20. Right arm serum contains M-DM-C-DG-CR-F, 0.36ml given SQ to right upper arm. Exp: 02/27/20. Tolerated procedure well. No adverse reaction noted. Pt refused to stay the thirty minutes to monitor. She has epi pen with her. Will notify if any problems      Assessment:     Allergy desensitization therapy      Plan:     1. Continue to monitor for s/s of localized or systemic reaction to immunotherapy injections. Carry epi-pen at all times. F/u if any problems. RTC in 1 week for injections

## 2019-08-03 ENCOUNTER — Ambulatory Visit: Payer: PRIVATE HEALTH INSURANCE | Admitting: Family Medicine

## 2019-08-03 VITALS — BP 118/76 | Resp 18

## 2019-08-03 DIAGNOSIS — Z516 Encounter for desensitization to allergens: Secondary | ICD-10-CM

## 2019-08-03 NOTE — Progress Notes (Signed)
  Subjective:     Patient ID: Susan Mason, female   DOB: 03-Nov-1960, 58 y.o.   MRN: [id number]  HPI Elen presents to the [facility] clinic today for her weekly allergy injections. She tolerated her injections last week without problem. No s/s of systemic or localized reaction. She is doing well today without complaints.   Review of Systems  Constitutional: Negative for chills and fever.  HENT: Negative for congestion, ear pain, sinus pressure and sinus pain.   Eyes: Negative for discharge and itching.  Respiratory: Negative for cough, chest tightness, shortness of breath and wheezing.        Objective:   Physical Exam Vitals reviewed.  Constitutional:      General: She is not in acute distress.    Appearance: Normal appearance. She is not toxic-appearing.  HENT:     Head: Normocephalic and atraumatic.  Eyes:     General:        Right eye: No discharge.        Left eye: No discharge.  Pulmonary:     Effort: Pulmonary effort is normal. No respiratory distress.  Skin:    General: Skin is warm and dry.  Neurological:     Mental Status: She is alert and oriented to person, place, and time.  Psychiatric:        Mood and Affect: Mood normal.        Behavior: Behavior normal.   Procedure Note: Left arm serum contatins T-G-W, 0.71ml given SQ to left upper arm, Exp: 06/21/20.Right arm serum contains M-DM-C-DG-CR-F, 0.39ml given SQ to right upper arm. Exp: 02/27/20. Tolerated procedure well. No adverse reaction noted. Pt refused to stay the thirty minutes to monitor. She has epi pen with her. Will notify if any problems     Assessment:     Allergy desensitization therapy      Plan:     1. Takhia will continue to monitor for s/s allergic reaction to injections. She will carry epi-pen with her at all times. Notify if any problems. F/u in 1 week.

## 2019-08-10 ENCOUNTER — Ambulatory Visit: Payer: PRIVATE HEALTH INSURANCE

## 2019-08-17 ENCOUNTER — Ambulatory Visit: Payer: PRIVATE HEALTH INSURANCE | Admitting: Family Medicine

## 2019-08-17 VITALS — BP 128/84

## 2019-08-17 DIAGNOSIS — Z516 Encounter for desensitization to allergens: Secondary | ICD-10-CM

## 2019-08-17 NOTE — Progress Notes (Signed)
  Subjective:     Patient ID: Susan Mason, female   DOB: [birth year], 60 y.o.   MRN: [id number]  HPI Susan Mason presents to the [facility] clinic today for her weekly allergy injections. She did not receive her injections last week. She states she received the Covid-19 vaccine and did not want to get another shot. She states her allergy symptoms are under good control. Denies any problems. She continues to take her zyrtec daily and has epi-pen on hand.   Review of Systems  Constitutional: Negative for chills and fever.  HENT: Negative for congestion, sinus pressure and sinus pain.   Eyes: Negative for redness and itching.  Respiratory: Negative for cough, shortness of breath and wheezing.        Objective:   Physical Exam Vitals reviewed.  Constitutional:      General: She is not in acute distress.    Appearance: Normal appearance. She is not toxic-appearing.  HENT:     Head: Normocephalic and atraumatic.  Eyes:     General:        Right eye: No discharge.        Left eye: No discharge.  Pulmonary:     Effort: Pulmonary effort is normal. No respiratory distress.  Skin:    General: Skin is warm and dry.  Neurological:     Mental Status: She is alert and oriented to person, place, and time.  Psychiatric:        Mood and Affect: Mood normal.        Behavior: Behavior normal.   Procedure Note: Left arm serum contatins T-G-W, 0.5 ml given SQ to left upper arm, Exp: 06/21/20.Right arm serum contains M-DM-C-DG-CR-F, 0.27ml given SQ to right upper arm. Exp: 02/27/20. Tolerated procedure well. No adverse reaction noted. Pt refused to stay the thirty minutes to monitor. She has epi pen with her. Will notify if any problems     Assessment:     Allergy desensitization therapy      Plan:     1. Continue to monitor for s/s of systemic or localized reaction to injections. Carry epi-pen at all times. Notify if any problems. F/u in 1 week or sooner prn.

## 2019-08-24 ENCOUNTER — Ambulatory Visit: Payer: PRIVATE HEALTH INSURANCE | Admitting: Family Medicine

## 2019-08-24 VITALS — BP 114/80

## 2019-08-24 DIAGNOSIS — Z516 Encounter for desensitization to allergens: Secondary | ICD-10-CM

## 2019-08-24 NOTE — Progress Notes (Signed)
  Subjective:     Patient ID: Susan Mason, female   DOB: 1960-10-26, 59 y.o.   MRN: [id number]  HPI Susan Mason presents to the [facility] clinic today for her weekly allergy injections. She tolerated well last week without problem. States she feels like she is doing well. Denies any problems. She continues to take her zyrtec daily and has epi-pen on hand.   Review of Systems  Constitutional: Negative for chills, fever and unexpected weight change.  HENT: Negative for congestion, sinus pressure, sinus pain and sore throat.   Eyes: Negative for discharge.  Respiratory: Negative for cough, shortness of breath and wheezing.   Cardiovascular: Negative for chest pain.       Objective:   Physical Exam Vitals reviewed.  Constitutional:      General: She is not in acute distress.    Appearance: Normal appearance. She is normal weight.  HENT:     Head: Normocephalic and atraumatic.  Eyes:     General:        Right eye: No discharge.        Left eye: No discharge.  Pulmonary:     Effort: Pulmonary effort is normal. No respiratory distress.  Skin:    General: Skin is warm and dry.  Neurological:     Mental Status: She is alert and oriented to person, place, and time.  Psychiatric:        Mood and Affect: Mood normal.        Behavior: Behavior normal.   Procedure Note: Left arm serum contatins T-G-W, 0.5 ml given SQ to left upper arm, Exp: 06/21/20.Right arm serum contains M-DM-C-DG-CR-F, 0.66ml given SQ to right upper arm. Exp: 02/27/20. Tolerated procedure well. No adverse reaction noted. Pt refused to stay the thirty minutes to monitor. She has epi pen with her. Will notify if any problems     Assessment:     Allergy desensitization therapy      Plan:     1. Continue to monitor for s/s of systemic or localized reaction. Carry epi-pen at all times. F/u if any problems. RTC in 1 week for allergy injections.

## 2019-08-31 ENCOUNTER — Ambulatory Visit: Payer: PRIVATE HEALTH INSURANCE | Admitting: Family Medicine

## 2019-08-31 VITALS — BP 128/80

## 2019-08-31 DIAGNOSIS — Z516 Encounter for desensitization to allergens: Secondary | ICD-10-CM

## 2019-08-31 NOTE — Progress Notes (Signed)
  Subjective:     Patient ID: Susan Mason, female   DOB: 1961-01-16, 59 y.o.   MRN: [id number]  HPI Susan Mason presents [facility] the [facility] today for her weekly allergy injections. She tolerated well last week without problem. Denies s/s allergic reaction. Denies any problems today.   Review of Systems  Constitutional: Negative for chills and fever.  HENT: Negative for congestion, ear pain, sinus pain and sore throat.   Respiratory: Negative for cough, chest tightness and shortness of breath.        Objective:   Physical Exam Vitals reviewed.  Constitutional:      General: She is not in acute distress.    Appearance: Normal appearance. She is not toxic-appearing.  HENT:     Head: Normocephalic and atraumatic.  Eyes:     General:        Right eye: No discharge.        Left eye: No discharge.  Pulmonary:     Effort: Pulmonary effort is normal. No respiratory distress.  Skin:    General: Skin is warm and dry.  Neurological:     Mental Status: She is alert and oriented to person, place, and time.  Psychiatric:        Mood and Affect: Mood normal.        Behavior: Behavior normal.   Procedure Note: Left arm serum contatins T-G-W, 0.33ml given SQ to left upper arm, Exp: 06/21/20.Right arm serum contains M-DM-C-DG-CR-F, New vial today, 0.40ml given SQ to right upper arm.  Tolerated procedure well. No adverse reaction noted. Pt refused to stay the thirty minutes to monitor. She has epi pen with her. Will notify if any problems     Assessment:     Allergy desensitization therapy      Plan:     1. Continue to monitor for s/s of systemic or localized reaction. Carry epi-pen at all times. F/u if any problems. RTC in 1 week for allergy injections.

## 2019-09-07 ENCOUNTER — Ambulatory Visit: Payer: PRIVATE HEALTH INSURANCE | Admitting: Family Medicine

## 2019-09-07 VITALS — BP 140/80

## 2019-09-07 DIAGNOSIS — Z516 Encounter for desensitization to allergens: Secondary | ICD-10-CM

## 2019-09-07 NOTE — Progress Notes (Signed)
  Subjective:     Patient ID: Susan Mason, female   DOB: 1960/10/01, 59 y.o.   MRN: [id number]  HPI Lanessa presents to the employee [facility] clinic today for her weekly allergy injections. She tolerated last week's injections well. Denies any s/s of localized or systemic reaction. She also received her 2nd dose of the covid-19 vaccine earlier this week and states she did well with that. She denies any problems.   Review of Systems  Constitutional: Negative for chills and fever.  HENT: Negative for congestion, ear pain, sinus pain and sore throat.   Respiratory: Negative for cough, chest tightness and shortness of breath.   Neurological: Negative for headaches.       Objective:   Physical Exam Vitals reviewed.  Constitutional:      General: She is not in acute distress.    Appearance: Normal appearance. She is not toxic-appearing.  HENT:     Head: Normocephalic and atraumatic.  Eyes:     General:        Right eye: No discharge.        Left eye: No discharge.  Pulmonary:     Effort: Pulmonary effort is normal. No respiratory distress.  Skin:    General: Skin is warm and dry.  Neurological:     Mental Status: She is alert and oriented to person, place, and time.  Psychiatric:        Mood and Affect: Mood normal.        Behavior: Behavior normal.   Procedure Note: Left arm serum contatins T-G-W, 0.71ml given SQ to left upper arm, Exp: 06/21/20.Right arm serum contains M-DM-C-DG-CR-F, New vial today, 0.50ml given SQ to right upper arm.  Tolerated procedure well. No adverse reaction noted. Pt refused to stay the thirty minutes to monitor. She has epi pen with her. Will notify if any problems     Assessment:     Allergy desensitization therapy      Plan:     1. Continue to monitor for s/s of localized or systemic allergic reaction to serum. Carry epi-pen at all times. Notify if any problems. RTC in 1 week.

## 2019-09-14 ENCOUNTER — Ambulatory Visit: Payer: PRIVATE HEALTH INSURANCE | Admitting: Family Medicine

## 2019-09-14 ENCOUNTER — Encounter: Payer: Self-pay | Admitting: Family Medicine

## 2019-09-14 VITALS — BP 126/80

## 2019-09-14 DIAGNOSIS — Z516 Encounter for desensitization to allergens: Secondary | ICD-10-CM

## 2019-09-14 NOTE — Progress Notes (Signed)
  Subjective:     Patient ID: Susan Mason, female   DOB: 10-Dec-1960, 59 y.o.   MRN: [id number]  HPI Lorraine presents to the employee [facility] clinic today for her weekly allergy injections. She tolerated well last week. Denies any problems or concerns.   Review of Systems  Constitutional: Negative for chills and fever.  HENT: Negative for congestion, ear pain, sinus pressure, sinus pain and sore throat.   Eyes: Negative for discharge.  Respiratory: Negative for cough and shortness of breath.        Objective:   Physical Exam Vitals reviewed.  Constitutional:      General: She is not in acute distress.    Appearance: Normal appearance. She is not toxic-appearing.  Eyes:     General:        Right eye: No discharge.        Left eye: No discharge.  Pulmonary:     Effort: Pulmonary effort is normal. No respiratory distress.  Skin:    General: Skin is warm and dry.  Neurological:     Mental Status: She is alert and oriented to person, place, and time.  Psychiatric:        Mood and Affect: Mood normal.        Behavior: Behavior normal.    Procedure Note: Left arm serum contatins T-G-W, 0.59ml given SQ to left upper arm, Exp: 06/21/20.Right arm serum contains M-DM-C-DG-CR-F, Exp: 08/10/2020, 0.48ml given SQ to right upper arm. Tolerated procedure well. No adverse reaction noted. Pt refused to stay the thirty minutes to monitor. She has epi pen with her. Will notify if any problems    Assessment:     Allergy desensitization therapy      Plan:     1. Continue to monitor for s/s of systemic or localized reaction. Carry epi-pen at all times. Notify if any problems. 2. RTC in 1 week

## 2019-09-21 ENCOUNTER — Ambulatory Visit: Payer: PRIVATE HEALTH INSURANCE

## 2019-09-28 ENCOUNTER — Ambulatory Visit: Payer: PRIVATE HEALTH INSURANCE | Admitting: Family Medicine

## 2019-09-28 VITALS — BP 120/78

## 2019-09-28 DIAGNOSIS — Z516 Encounter for desensitization to allergens: Secondary | ICD-10-CM

## 2019-09-28 NOTE — Progress Notes (Signed)
  Subjective:     Patient ID: Susan Mason, female   DOB: 19-Apr-1961, 59 y.o.   MRN: [id number]  HPI Susan Mason presents to the employee health clinic today for her weekly allergy injections. She did not receive her injections last week due to inclement weather. She has tolerated well in the past without problems. She is doing well today, no complaints.      Review of Systems  Constitutional: Negative for chills and fever.  HENT: Negative for congestion, ear pain, sinus pressure, sinus pain and sore throat.   Respiratory: Negative for cough, shortness of breath and wheezing.        Objective:   Physical Exam Vitals reviewed.  Constitutional:      General: She is not in acute distress.    Appearance: Normal appearance.  HENT:     Head: Normocephalic and atraumatic.  Eyes:     General:        Right eye: No discharge.        Left eye: No discharge.  Pulmonary:     Effort: Pulmonary effort is normal. No respiratory distress.  Skin:    General: Skin is warm and dry.  Neurological:     Mental Status: She is alert and oriented to person, place, and time.  Psychiatric:        Mood and Affect: Mood normal.        Behavior: Behavior normal.    Procedure Note: Left arm serum contatins T-G-W, 0.32ml given SQ to left upper arm, Exp: 06/21/20.Right arm serum contains M-DM-C-DG-CR-F, Exp: 08/10/2020, 0.29ml given SQ to right upper arm. Tolerated procedure well. No adverse reaction noted. Pt refused to stay the thirty minutes to monitor. She has epi pen with her. Will notify if any problems    Assessment:     Allergy desensitization therapy      Plan:     1. Continue to monitor for s/s of localized or systemic reaction. Carry epi-pen at all times. Notify if any problems. RTC in 1 week.

## 2019-10-05 ENCOUNTER — Ambulatory Visit: Payer: PRIVATE HEALTH INSURANCE | Admitting: Family Medicine

## 2019-10-05 VITALS — BP 118/76

## 2019-10-05 DIAGNOSIS — Z516 Encounter for desensitization to allergens: Secondary | ICD-10-CM

## 2019-10-05 NOTE — Progress Notes (Signed)
  Subjective:     Patient ID: Susan Mason, female   DOB: 1960/08/14, 59 y.o.   MRN: [id number]  HPI  Susan Mason presents to the employee health clinic today for her weekly allergy injections. She tolerated last week's injections well without problem. She denies any current problems or concerns, states she is feeling well.   Review of Systems  Constitutional: Negative for chills and fever.  HENT: Negative for congestion, sinus pressure, sinus pain and sore throat.   Respiratory: Negative for cough, shortness of breath and wheezing.        Objective:   Physical Exam Vitals reviewed.  Constitutional:      General: She is not in acute distress.    Appearance: Normal appearance.  HENT:     Head: Normocephalic and atraumatic.  Eyes:     General:        Right eye: No discharge.        Left eye: No discharge.  Pulmonary:     Effort: Pulmonary effort is normal. No respiratory distress.  Skin:    General: Skin is warm and dry.  Neurological:     Mental Status: She is alert and oriented to person, place, and time.  Psychiatric:        Mood and Affect: Mood normal.        Behavior: Behavior normal.   Injection Note: Left arm serum contatins T-G-W, 0.62ml given SQ to left upper arm, Exp: 06/21/20.Right arm serum contains M-DM-C-DG-CR-F,Exp: 08/10/2020, 0.103ml given SQ to right upper arm. Tolerated procedure well. No adverse reaction noted. Pt refused to stay the thirty minutes to monitor. She has epi pen with her. Will notify if any problems      Assessment:     Allergy desensitization therapy      Plan:     1. Continue to monitor for s/s of localized or systemic reaction. Keep Epi-pen with you at all times. Notify if any problems or concerns. RTC in 1 week.

## 2019-10-12 ENCOUNTER — Ambulatory Visit: Payer: PRIVATE HEALTH INSURANCE | Admitting: Family Medicine

## 2019-10-12 VITALS — BP 120/78

## 2019-10-12 DIAGNOSIS — Z516 Encounter for desensitization to allergens: Secondary | ICD-10-CM

## 2019-10-12 NOTE — Progress Notes (Signed)
  Subjective:     Patient ID: Susan Mason, female   DOB: June 04, 1961, 59 y.o.   MRN: [id number]  HPI Susan Mason presents to the employee health clinic today for her weekly allergy injections. She tolerated last week's dose without problem. She denies any current concerns or problems today.   Review of Systems  Constitutional: Negative for chills and fever.  HENT: Negative for congestion, ear pain, sinus pressure and sinus pain.   Respiratory: Negative for shortness of breath and wheezing.        Objective:   Physical Exam Vitals reviewed.  Constitutional:      General: She is not in acute distress.    Appearance: Normal appearance.  HENT:     Head: Normocephalic and atraumatic.  Eyes:     General:        Right eye: No discharge.        Left eye: No discharge.  Pulmonary:     Effort: Pulmonary effort is normal. No respiratory distress.  Skin:    General: Skin is warm and dry.  Neurological:     Mental Status: She is alert and oriented to person, place, and time.  Psychiatric:        Mood and Affect: Mood normal.        Behavior: Behavior normal.   Injection Note: Left arm serum contatins T-G-W, 0.59ml given SQ to left upper arm, Exp: 06/21/20.Right arm serum contains M-DM-C-DG-CR-F,Exp: 08/10/2020, 0.85ml given SQ to right upper arm. Tolerated procedure well. No adverse reaction noted. Pt refused to stay the thirty minutes to monitor. She has epi pen with her. Will notify if any problems     Assessment:     Allergy desensitization therapy      Plan:     1. Continue to monitor for s/s of local or systemic allergic reaction. Carry epi-pen at all times. Notify if any problems.  2. RTC in 1 week for allergy injections.

## 2019-10-19 ENCOUNTER — Ambulatory Visit: Payer: PRIVATE HEALTH INSURANCE | Admitting: Family Medicine

## 2019-10-19 VITALS — BP 124/76

## 2019-10-19 DIAGNOSIS — Z516 Encounter for desensitization to allergens: Secondary | ICD-10-CM

## 2019-10-19 NOTE — Progress Notes (Signed)
  Subjective:     Patient ID: Susan Mason, female   DOB: 1961-05-26, 59 y.o.   MRN: [id number]  HPI Susan Mason presents to the employee health clinic today for her weekly allergy injections. She tolerated her injections last week without problem. She denies any current problems or concerns.   Review of Systems  Constitutional: Negative for chills and fever.  HENT: Negative for congestion, ear pain, sinus pressure, sinus pain and sore throat.   Respiratory: Negative for shortness of breath and wheezing.        Objective:   Physical Exam Vitals reviewed.  Constitutional:      General: She is not in acute distress.    Appearance: Normal appearance. She is not toxic-appearing.  HENT:     Head: Normocephalic and atraumatic.  Eyes:     General:        Right eye: No discharge.        Left eye: No discharge.  Pulmonary:     Effort: Pulmonary effort is normal. No respiratory distress.  Skin:    General: Skin is warm and dry.  Neurological:     Mental Status: She is alert and oriented to person, place, and time.  Psychiatric:        Mood and Affect: Mood normal.        Behavior: Behavior normal.   Injection Note:Left arm serum contatins T-G-W, 0.14ml given SQ to left upper arm, Exp: 06/21/20.Right arm serum contains M-DM-C-DG-CR-F,Exp: 08/10/2020, 0.63ml given SQ to right upper arm. Tolerated procedure well. No adverse reaction noted. Pt refused to stay the thirty minutes to monitor. She has epi pen with her. Will notify if any problems     Assessment:     Allergy desensitization therapy      Plan:     1. Continue to monitor for s/s localized or systemic reaction. Carry epi-pen at all time. Notify if any problems. RTC in 1 week.

## 2019-10-26 ENCOUNTER — Ambulatory Visit: Payer: PRIVATE HEALTH INSURANCE | Admitting: Family Medicine

## 2019-10-26 VITALS — BP 124/80

## 2019-10-26 DIAGNOSIS — Z516 Encounter for desensitization to allergens: Secondary | ICD-10-CM

## 2019-10-26 NOTE — Progress Notes (Signed)
  Subjective:     Patient ID: Susan Mason, female   DOB: Jun 27, 1961, 59 y.o.   MRN: [id number]  HPI  Katrenia presents to the employee health clinic today for her weekly allergy injections. She tolerated her injections last week well without problem. She denies any current concerning symptoms. She is feeling well.   Review of Systems  Constitutional: Negative for chills and fever.  HENT: Negative for congestion, ear pain, sinus pressure and sinus pain.   Respiratory: Negative for cough, shortness of breath and wheezing.        Objective:   Physical Exam Vitals reviewed.  Constitutional:      General: She is not in acute distress.    Appearance: Normal appearance. She is not toxic-appearing.  HENT:     Head: Normocephalic and atraumatic.  Eyes:     General:        Right eye: No discharge.        Left eye: No discharge.  Pulmonary:     Effort: Pulmonary effort is normal. No respiratory distress.  Skin:    General: Skin is warm and dry.  Neurological:     Mental Status: She is alert and oriented to person, place, and time.  Psychiatric:        Mood and Affect: Mood normal.        Behavior: Behavior normal.    Injection Note:Left arm serum contatins T-G-W, 0.23ml given SQ to left upper arm, Exp: 06/21/20.Right arm serum contains M-DM-C-DG-CR-F,Exp: 08/10/2020, 0.74ml given SQ to right upper arm. Tolerated procedure well. No adverse reaction noted. Pt refused to stay the thirty minutes to monitor. She has epi pen with her. Will notify if any problems    Assessment:     Allergy desensitization therapy      Plan:     1. Continue to monitor for s/s localized or systemic reaction. Carry epi-pen at all times. Notify if any problems. RTC in 1 week.

## 2019-11-02 ENCOUNTER — Ambulatory Visit: Payer: PRIVATE HEALTH INSURANCE | Admitting: Family Medicine

## 2019-11-02 VITALS — BP 118/74

## 2019-11-02 DIAGNOSIS — Z516 Encounter for desensitization to allergens: Secondary | ICD-10-CM

## 2019-11-02 NOTE — Progress Notes (Signed)
  Subjective:     Patient ID: Susan Mason, female   DOB: [birth year], 59 y.o.   MRN: [id number]  HPI Susan Mason presents to the employee [facility] clinic today for her weekly allergy injections. She tolerated her injections last week well without problem. She denies any current concerning symptoms. She is feeling well.   Review of Systems  Constitutional: Negative for chills and fever.  HENT: Negative for congestion, ear pain, sinus pressure, sinus pain and sore throat.   Eyes: Negative for discharge and itching.  Respiratory: Negative for cough, chest tightness and wheezing.        Objective:   Physical Exam Vitals reviewed.  Constitutional:      General: She is not in acute distress.    Appearance: Normal appearance.  HENT:     Head: Normocephalic and atraumatic.  Eyes:     General:        Right eye: No discharge.        Left eye: No discharge.  Pulmonary:     Effort: Pulmonary effort is normal. No respiratory distress.  Skin:    General: Skin is warm and dry.  Neurological:     Mental Status: She is alert and oriented to person, place, and time.  Psychiatric:        Mood and Affect: Mood normal.        Behavior: Behavior normal.    Today's Vitals   11/02/19 0841  BP: 118/74   There is no height or weight on file to calculate BMI.  Injection Note:Left arm serum contatins T-G-W, 0.48ml given SQ to left upper arm, Exp: 06/21/20.Right arm serum contains M-DM-C-DG-CR-F,Exp: 08/10/2020, 0.3ml given SQ to right upper arm. Tolerated procedure well. No adverse reaction noted. Pt refused to stay the thirty minutes to monitor. She has epi pen with her. Will notify if any problems      Assessment:     Allergy desensitization therapy      Plan:     1. Continue to monitor for s/s localized or systemic reaction. Carry epi-pen at all times. Notify if any problems. RTC in 1 week.

## 2019-11-09 ENCOUNTER — Ambulatory Visit: Payer: PRIVATE HEALTH INSURANCE | Admitting: Family Medicine

## 2019-11-09 VITALS — BP 128/82

## 2019-11-09 DIAGNOSIS — Z516 Encounter for desensitization to allergens: Secondary | ICD-10-CM

## 2019-11-09 NOTE — Progress Notes (Signed)
  Subjective:     Patient ID: Susan Mason, female   DOB: Dec 04, 1960, 59 y.o.   MRN: [id number]  HPI Susan Mason presents to the [facility] clinic today for her weekly allergy injections. She tolerated her injections last week well without problem. She denies any current concerning symptoms. She is feeling well.  Review of Systems  Constitutional: Negative for chills and fever.  HENT: Negative for congestion, ear pain, rhinorrhea, sinus pressure, sinus pain and sore throat.   Eyes: Negative for discharge and itching.  Respiratory: Negative for shortness of breath and wheezing.        Objective:   Physical Exam Vitals reviewed.  Constitutional:      General: She is not in acute distress.    Appearance: Normal appearance.  HENT:     Head: Normocephalic and atraumatic.  Eyes:     General:        Right eye: No discharge.        Left eye: No discharge.  Pulmonary:     Effort: Pulmonary effort is normal. No respiratory distress.  Skin:    General: Skin is warm and dry.  Neurological:     Mental Status: She is alert and oriented to person, place, and time.  Psychiatric:        Mood and Affect: Mood normal.        Behavior: Behavior normal.    Today's Vitals   11/09/19 0840  BP: 128/82   There is no height or weight on file to calculate BMI.  Injection Note:Left arm serum contatins T-G-W, 0.34ml given SQ to left upper arm, Exp: 06/21/20.Right arm serum contains M-DM-C-DG-CR-F,Exp: 08/10/2020, 0.46ml given SQ to right upper arm. Tolerated procedure well. No adverse reaction noted. Pt refused to stay the thirty minutes to monitor. She has epi pen with her. Will notify if any problems      Assessment:     Allergy desensitization therapy      Plan:     1. Continue to monitor for s/s localized or systemic reaction. Carry epi-pen at all times. Notify if any problems. RTC in 1 week.

## 2019-11-23 ENCOUNTER — Ambulatory Visit: Payer: PRIVATE HEALTH INSURANCE | Admitting: Family Medicine

## 2019-11-23 VITALS — BP 122/78 | HR 65 | Resp 16

## 2019-11-23 DIAGNOSIS — Z516 Encounter for desensitization to allergens: Secondary | ICD-10-CM

## 2019-11-23 NOTE — Progress Notes (Signed)
  Subjective:     Patient ID: Susan Mason, female   DOB: 02-Jan-1961, 59 y.o.   MRN: [id number]  HPI Zayla presents to the employee health clinic today for her weekly allergy injections. She tolerated her injections last week well without problem. She denies any current concerning symptoms. She is feeling well.  Review of Systems  Constitutional: Negative for chills and fever.  HENT: Negative for congestion, ear pain, sinus pain and sore throat.   Eyes: Negative for discharge and itching.  Respiratory: Negative for cough, shortness of breath and wheezing.        Objective:   Physical Exam Vitals reviewed.  Constitutional:      General: She is not in acute distress.    Appearance: Normal appearance.  HENT:     Head: Normocephalic and atraumatic.  Eyes:     General:        Right eye: No discharge.        Left eye: No discharge.  Cardiovascular:     Rate and Rhythm: Normal rate.  Pulmonary:     Effort: Pulmonary effort is normal. No respiratory distress.  Skin:    General: Skin is warm and dry.  Neurological:     Mental Status: She is alert and oriented to person, place, and time.  Psychiatric:        Mood and Affect: Mood normal.        Behavior: Behavior normal.    Today's Vitals   11/23/19 0834  BP: 122/78  Pulse: 65  Resp: 16  SpO2: 99%   There is no height or weight on file to calculate BMI.  Injection Note:Left arm serum contatins T-G-W, 0.53ml given SQ to left upper arm, Exp: 06/21/20.Right arm serum contains M-DM-C-DG-CR-F,Exp: 08/10/2020, 0.42ml given SQ to right upper arm. Tolerated procedure well. No adverse reaction noted. Pt refused to stay the thirty minutes to monitor. She has epi pen with her. Will notify if any problems     Assessment:     Allergy desensitization therapy      Plan:     1. Continue to monitor for s/s localized or systemic reaction. Carry epi-pen at all times. Notify if any problems. RTC in 1 week

## 2019-11-30 ENCOUNTER — Ambulatory Visit: Payer: PRIVATE HEALTH INSURANCE | Admitting: Family Medicine

## 2019-11-30 VITALS — BP 118/70

## 2019-11-30 DIAGNOSIS — Z516 Encounter for desensitization to allergens: Secondary | ICD-10-CM

## 2019-11-30 NOTE — Progress Notes (Signed)
  Subjective:     Patient ID: Susan Mason, female   DOB: 02-22-1961, 59 y.o.   MRN: [id number]  HPI Susan Mason presents to the [facility] clinic today for her weekly allergy injections. She reports she tolerated her injections well last week and denies any problem or reaction. She is feeling well, denies any current problems or concerns.   Review of Systems  Constitutional: Negative for chills and fever.  HENT: Negative for congestion, ear pain, postnasal drip, sinus pain and sore throat.   Eyes: Negative for discharge and itching.  Respiratory: Negative for cough, shortness of breath and wheezing.        Objective:   Physical Exam Vitals reviewed.  Constitutional:      General: She is not in acute distress.    Appearance: Normal appearance. She is not toxic-appearing.  HENT:     Head: Normocephalic and atraumatic.  Eyes:     General:        Right eye: No discharge.        Left eye: No discharge.  Pulmonary:     Effort: Pulmonary effort is normal. No respiratory distress.  Skin:    General: Skin is warm and dry.  Neurological:     Mental Status: She is alert and oriented to person, place, and time.  Psychiatric:        Mood and Affect: Mood normal.        Behavior: Behavior normal.    Today's Vitals   11/30/19 0857  BP: 118/70   There is no height or weight on file to calculate BMI. Injection Note:Left arm serum contatins T-G-W, 0.39ml given SQ to left upper arm, Exp: 06/21/20.Right arm serum contains M-DM-C-DG-CR-F,Exp: 08/10/2020, 0.1ml given SQ to right upper arm. Tolerated procedure well. No adverse reaction noted. Pt refused to stay the thirty minutes to monitor. She has epi pen with her. Will notify if any problems     Assessment:     Allergy desensitization therapy      Plan:     1. Continue to monitor for s/s localized or systemic reaction. Carry epi-pen at all times. Notify if any problems. RTC in 1 week

## 2019-12-07 ENCOUNTER — Ambulatory Visit: Payer: PRIVATE HEALTH INSURANCE | Admitting: Family Medicine

## 2019-12-07 VITALS — BP 124/78

## 2019-12-07 DIAGNOSIS — Z516 Encounter for desensitization to allergens: Secondary | ICD-10-CM

## 2019-12-07 NOTE — Progress Notes (Signed)
  Subjective:     Patient ID: Susan Mason, female   DOB: Jan 26, 1961, 59 y.o.   MRN: [id number]  HPI Cache presents to the [facility] clinic today for her weekly allergy injections. She has tolerated well in the past without problem. Reports no s/s of allergic reaction. She is feeling well. Denies any current problems or concerns.   Review of Systems  Constitutional: Negative for chills and fever.  HENT: Negative for ear pain, rhinorrhea, sinus pain and sore throat.   Respiratory: Negative for cough, shortness of breath and wheezing.        Objective:   Physical Exam Vitals reviewed.  Constitutional:      General: She is not in acute distress.    Appearance: Normal appearance.  HENT:     Head: Normocephalic and atraumatic.  Eyes:     General:        Right eye: No discharge.        Left eye: No discharge.  Pulmonary:     Effort: Pulmonary effort is normal. No respiratory distress.  Skin:    General: Skin is warm and dry.  Neurological:     Mental Status: She is alert and oriented to person, place, and time.  Psychiatric:        Mood and Affect: Mood normal.        Behavior: Behavior normal.   Injection Note:Left arm serum contatins T-G-W, 0.17ml given SQ to left upper arm, Exp: 06/21/20.Right arm serum contains M-DM-C-DG-CR-F,Exp: 08/10/2020, 0.48ml given SQ to right upper arm. Tolerated procedure well. No adverse reaction noted. Pt refused to stay the thirty minutes to monitor. She has epi pen with her. Will notify if any problems     Assessment:     Allergy desensitization therapy      Plan:     1. Continue to monitor for s/s localized or systemic allergic reaction. Carry epi-pen at all times. Notify if any problems. RTC in 1 week.

## 2019-12-14 ENCOUNTER — Ambulatory Visit: Payer: PRIVATE HEALTH INSURANCE

## 2019-12-21 ENCOUNTER — Ambulatory Visit: Payer: PRIVATE HEALTH INSURANCE | Admitting: Family Medicine

## 2019-12-21 VITALS — BP 112/78 | HR 65

## 2019-12-21 DIAGNOSIS — Z516 Encounter for desensitization to allergens: Secondary | ICD-10-CM

## 2019-12-21 NOTE — Progress Notes (Signed)
  Subjective:     Patient ID: Susan Mason, female   DOB: [birth year], 59 y.o.   MRN: [id number]  HPI Sarinity presents to the [facility] clinic today for her weekly allergy injections. She tolerated her injections last week well without problem. She reports feeling well today. Denies any problems or concerns.    Current Outpatient Medications:  .  cetirizine (ZYRTEC) 10 MG tablet, Take 10 mg by mouth daily., Disp: , Rfl:  .  cholecalciferol (VITAMIN D) 1000 UNITS tablet, Take 1,000 Units by mouth daily., Disp: , Rfl:  .  lisinopril (ZESTRIL) 5 MG tablet, Take 1 tablet (5 mg total) by mouth daily., Disp: 90 tablet, Rfl: 2 .  Multiple Vitamins-Minerals (MULTIVITAMIN WITH MINERALS) tablet, Take 1 tablet by mouth daily., Disp: , Rfl:  .  Probiotic Product (PROBIOTIC-10 PO), Take 1 tablet by mouth daily., Disp: , Rfl:  Allergies  Allergen Reactions  . Penicillins   . Doxycycline   . Erythromycin   . Erythromycin Base      Review of Systems  Constitutional: Negative for chills and fever.  HENT: Negative for congestion, ear pain, sinus pressure, sinus pain and sore throat.   Eyes: Negative for redness and itching.  Respiratory: Negative for cough, shortness of breath and wheezing.        Objective:   Physical Exam Vitals reviewed.  Constitutional:      General: She is not in acute distress.    Appearance: Normal appearance. She is not toxic-appearing.  HENT:     Head: Normocephalic and atraumatic.  Eyes:     General:        Right eye: No discharge.        Left eye: No discharge.  Cardiovascular:     Rate and Rhythm: Normal rate.  Pulmonary:     Effort: Pulmonary effort is normal. No respiratory distress.  Skin:    General: Skin is warm and dry.  Neurological:     Mental Status: She is alert and oriented to person, place, and time.  Psychiatric:        Mood and Affect: Mood normal.        Behavior: Behavior normal.    Today's Vitals   12/21/19 0834  BP: 112/78  Pulse: 65   SpO2: 99%   There is no height or weight on file to calculate BMI.  Injection Note:Left arm serum contatins T-G-W, 0.36ml given SQ to left upper arm, Exp: 06/21/20.Right arm serum contains M-DM-C-DG-CR-F,Exp: 08/10/2020, 0.72ml given SQ to right upper arm. Tolerated procedure well. No adverse reaction noted. Pt refused to stay the thirty minutes to monitor. She has epi pen with her. Will notify if any problems    Assessment:     Allergy desensitization therapy      Plan:     1. Continue to monitor for any s/s localized or systemic allergic reaction. Carry Epi-pen at all times. Notify if any problems. RTC in 1 week.

## 2020-01-05 IMAGING — MG DIGITAL SCREENING BILATERAL MAMMOGRAM WITH TOMO AND CAD
8 series · 9 of 24 positions shown · non-contrast
Comparison: Previous exam(s).

CLINICAL DATA: Screening.

EXAM:
DIGITAL SCREENING BILATERAL MAMMOGRAM WITH TOMO AND CAD

[R MLO synth-2D]
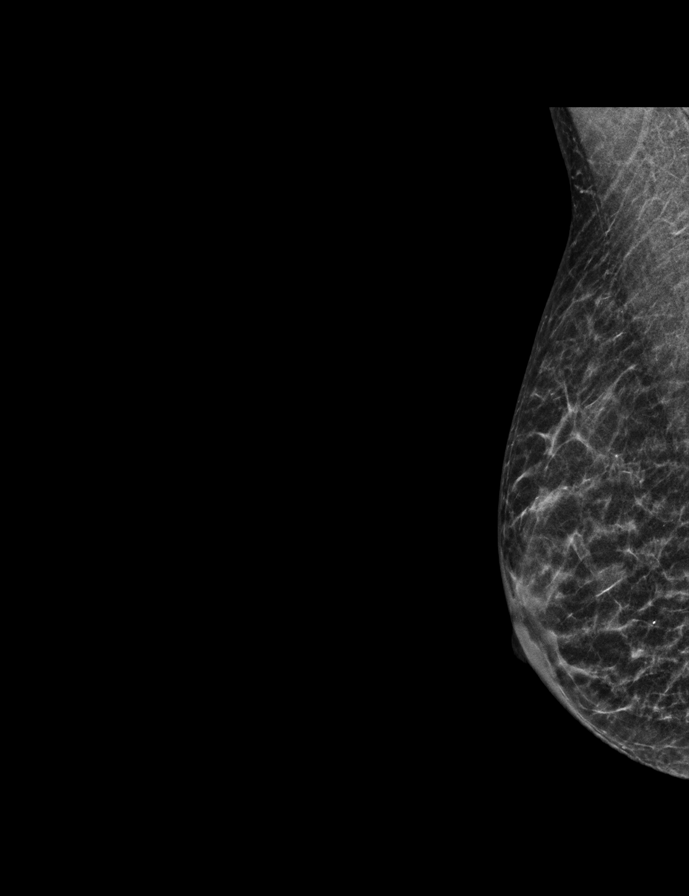

[L CC synth-2D]
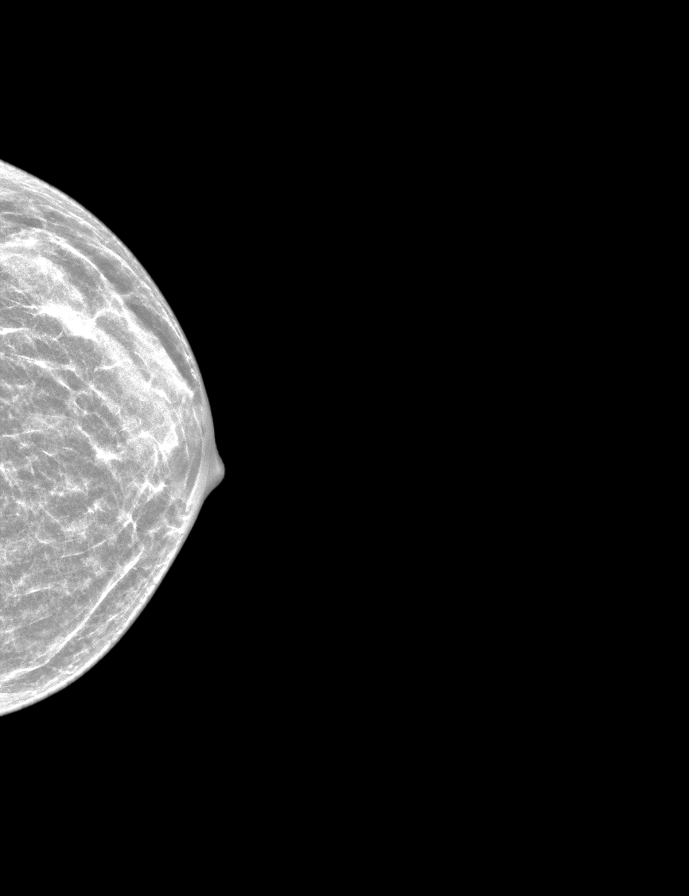

[R CC synth-2D]
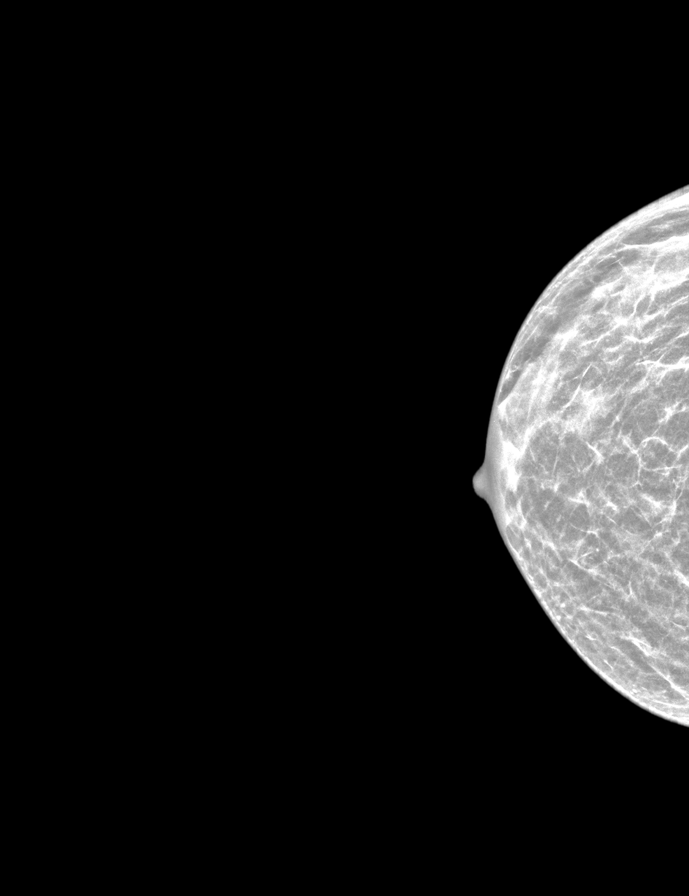

[L MLO synth-2D]
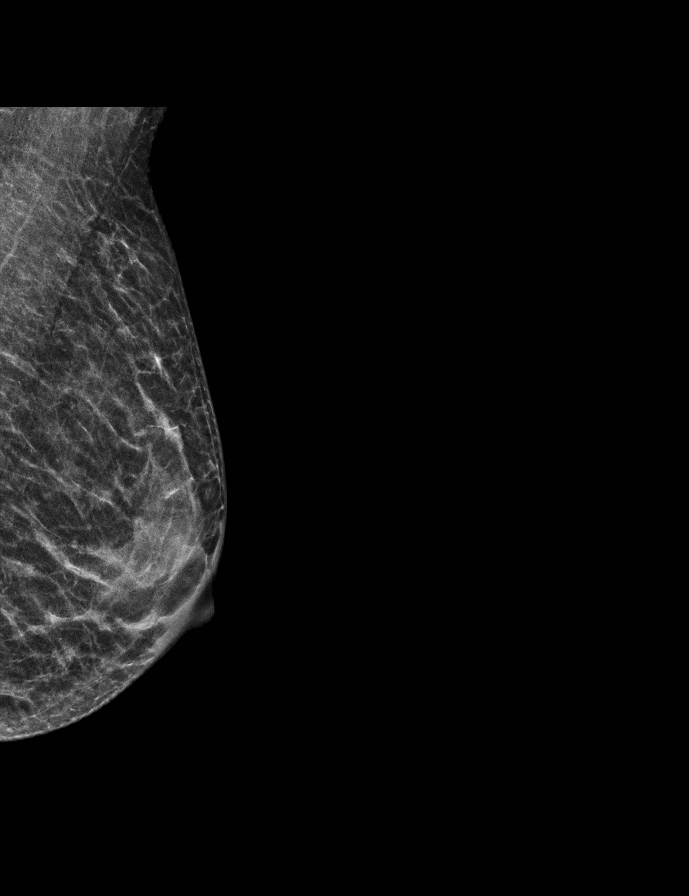

[L MLO tomo · 2 of 36 frames shown]
[frame 12/36]
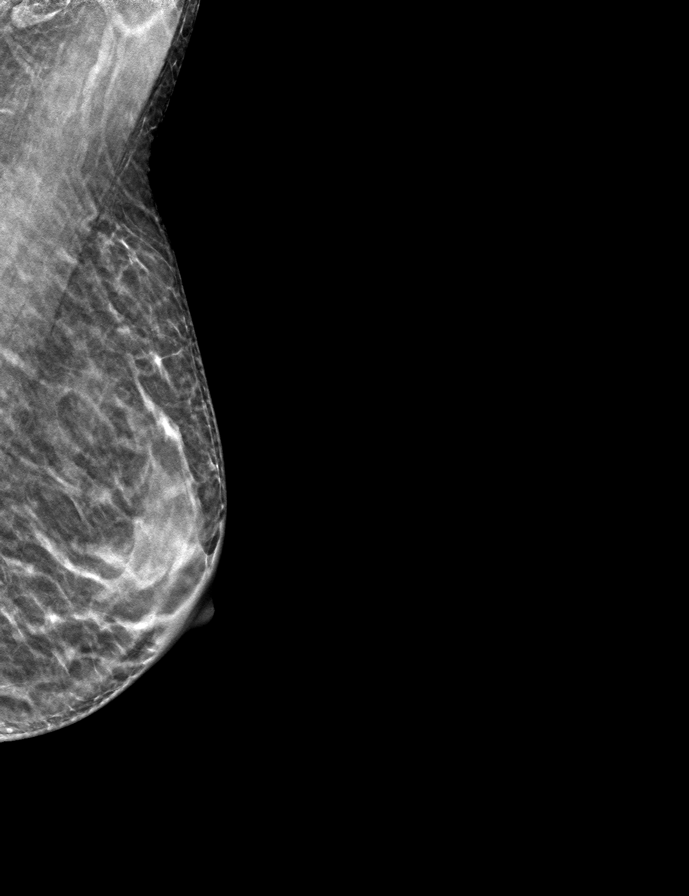
[frame 19/36]
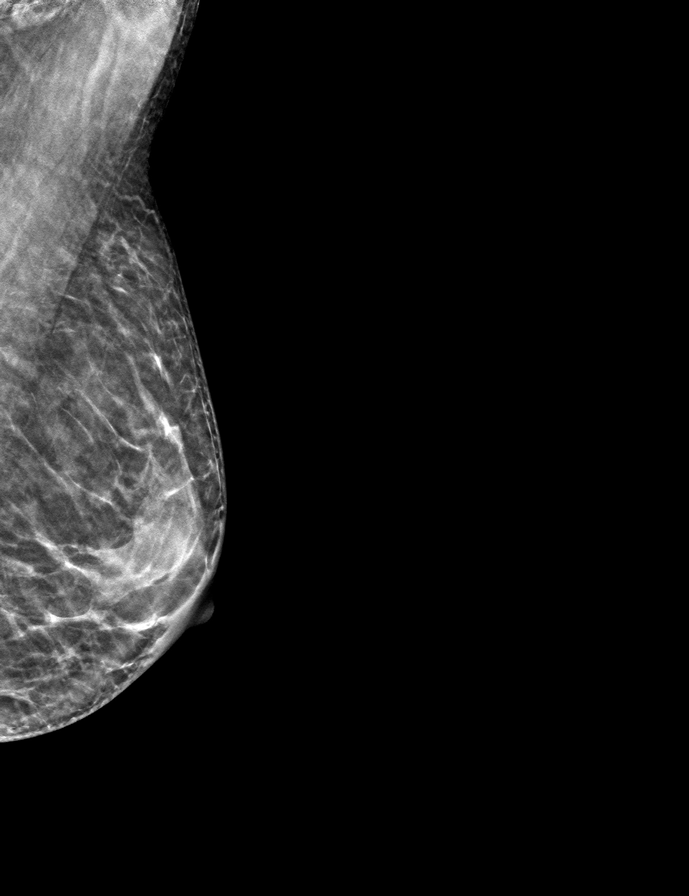

[R CC tomo · tomo slice 19/36.0]
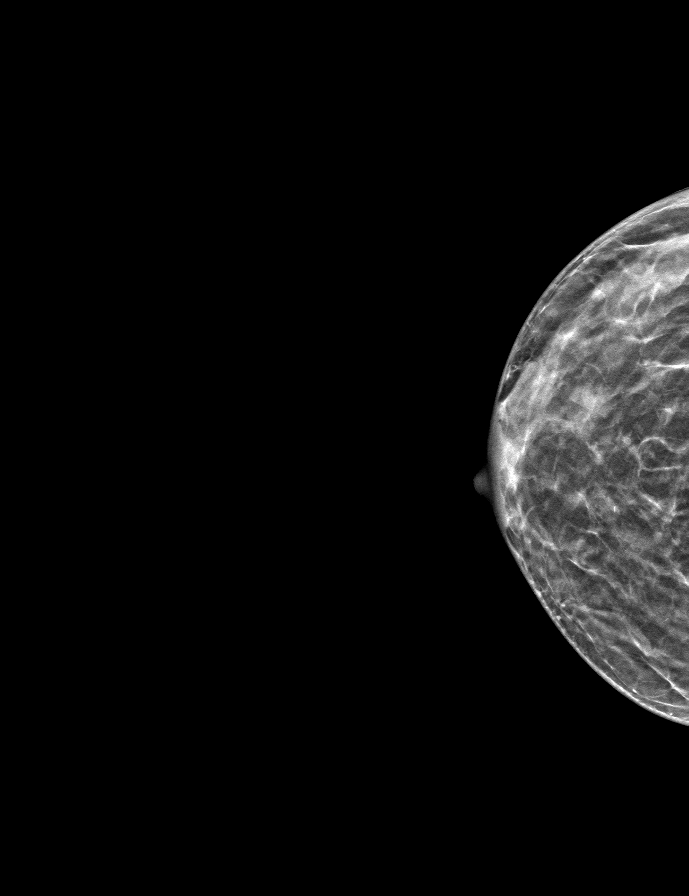

[L CC tomo · tomo slice 19/37.0]
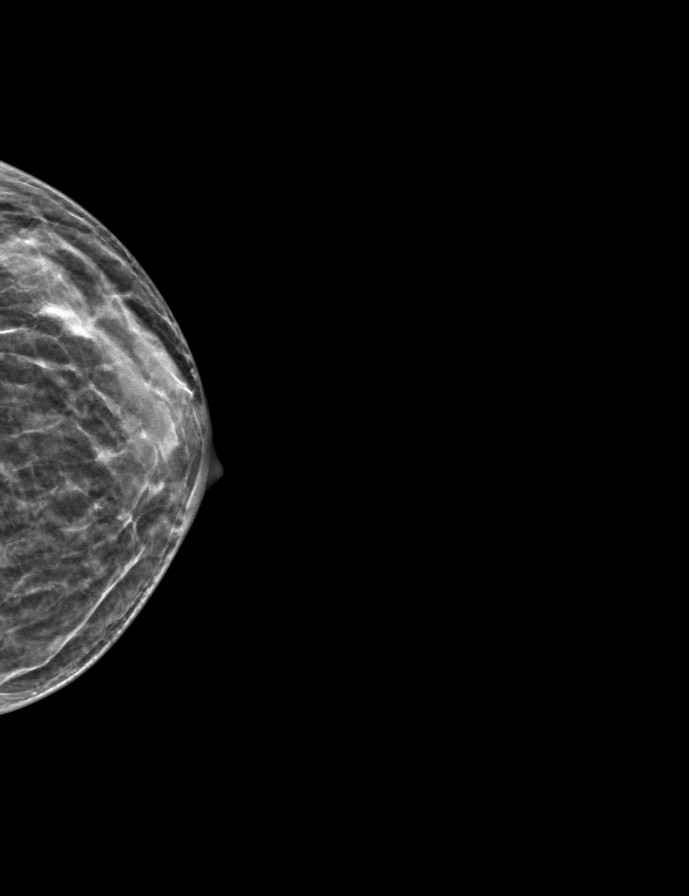

[R MLO tomo · tomo slice 18/35.0]
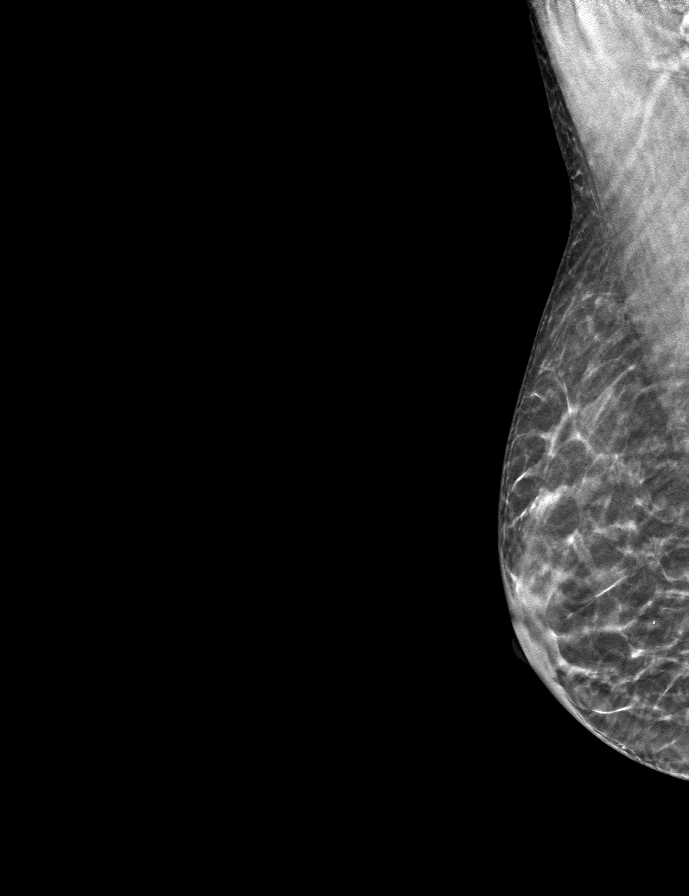

[9 of 24 positions shown; findings below may reference images not displayed]

ACR Breast Density Category c: The breast tissue is heterogeneously
dense, which may obscure small masses.
FINDINGS: There are no findings suspicious for malignancy. Images were
processed with CAD.
IMPRESSION: No mammographic evidence of malignancy. A result letter of this
screening mammogram will be mailed directly to the patient.

RECOMMENDATION:
Screening mammogram in one year. (Code:FT-U-LHB)

BI-RADS CATEGORY  1: Negative.

## 2020-01-18 ENCOUNTER — Ambulatory Visit: Payer: PRIVATE HEALTH INSURANCE | Admitting: Family Medicine

## 2020-01-18 VITALS — BP 122/78 | HR 65

## 2020-01-18 DIAGNOSIS — Z516 Encounter for desensitization to allergens: Secondary | ICD-10-CM

## 2020-01-18 NOTE — Progress Notes (Signed)
  Subjective:     Patient ID: Susan Mason, female   DOB: [birth year], 60 y.o.   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 She has been tolerating them well without problem. She is feeling well today and has no concerns.   Review of Systems  Constitutional: Negative for activity change, chills, fever and unexpected weight change.  HENT: Negative for congestion, ear discharge, ear pain, sinus pressure, sinus pain, sore throat and trouble swallowing.   Eyes: Negative for discharge and itching.  Respiratory: Negative for cough, chest tightness, shortness of breath and wheezing.   Cardiovascular: Negative for chest pain.       Objective:   Physical Exam Vitals reviewed.  Constitutional:      General: She is not in acute distress.    Appearance: She is normal weight.  HENT:     Head: Normocephalic and atraumatic.  Eyes:     General:        Right eye: No discharge.        Left eye: No discharge.  Cardiovascular:     Rate and Rhythm: Normal rate.  Pulmonary:     Effort: Pulmonary effort is normal. No respiratory distress.  Skin:    General: Skin is warm and dry.  Neurological:     Mental Status: She is alert and oriented to person, place, and time.  Psychiatric:        Mood and Affect: Mood normal.        Behavior: Behavior normal.    Today's Vitals   01/18/20 0835  BP: 122/78  Pulse: 65  SpO2: 99%   There is no height or weight on file to calculate BMI.  Injection Note:Left arm serum contatins T-G-W, 0.47ml given SQ to left upper arm, Exp: 12/20/2020.Right arm serum contains M-DM-C-DG-CR-F,Exp: 08/10/2020, 0.34ml given SQ to right upper arm. Tolerated procedure well. No adverse reaction noted. Pt refused to stay the thirty minutes to monitor. She has epi pen with her. Will notify if any problems     Assessment:     Allergy desensitization therapy      Plan:     1. Continue to monitor for s/s  localized or systemic allergic reaction. Carry epi-pen at all times. Notify if any problems. RTC in 1 week or sooner as needed.

## 2020-01-22 ENCOUNTER — Other Ambulatory Visit: Payer: Self-pay | Admitting: Internal Medicine

## 2020-01-22 DIAGNOSIS — Z1231 Encounter for screening mammogram for malignant neoplasm of breast: Secondary | ICD-10-CM

## 2020-01-25 ENCOUNTER — Ambulatory Visit: Payer: PRIVATE HEALTH INSURANCE | Admitting: Family Medicine

## 2020-01-25 VITALS — BP 124/76

## 2020-01-25 DIAGNOSIS — Z516 Encounter for desensitization to allergens: Secondary | ICD-10-CM

## 2020-01-25 NOTE — Progress Notes (Signed)
  Subjective:     Patient ID: Susan Mason, female   DOB: 12-11-1960, 60 y.o.   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 She tolerated well last week without problem. She denies any current problems or concerns.   Review of Systems  Constitutional: Negative for chills and fever.  HENT: Negative.   Eyes: Negative.   Respiratory: Negative.        Objective:   Physical Exam Vitals reviewed.  Constitutional:      General: She is not in acute distress.    Appearance: Normal appearance.  HENT:     Head: Normocephalic and atraumatic.  Eyes:     General:        Right eye: No discharge.        Left eye: No discharge.  Pulmonary:     Effort: Pulmonary effort is normal. No respiratory distress.  Skin:    General: Skin is warm and dry.  Neurological:     Mental Status: She is alert and oriented to person, place, and time.  Psychiatric:        Mood and Affect: Mood normal.        Behavior: Behavior normal.   Injection Note:Left arm serum contatins T-G-W, 0.44ml given SQ to left upper arm, Exp: 12/20/2020.Right arm serum contains M-DM-C-DG-CR-F,Exp: 08/10/2020, 0.21ml given SQ to right upper arm. Tolerated procedure well. No adverse reaction noted. Pt refused to stay the thirty minutes to monitor. She has epi pen with her. Will notify if any problems     Assessment:     Allergy desensitization therapy      Plan:     1. Tolerated injections well. Continue to monitor for s/s systemic or localized reactions. Carry epi-pen at all times. Notify if any problems. RTC in 1 week.

## 2020-02-01 ENCOUNTER — Ambulatory Visit: Payer: PRIVATE HEALTH INSURANCE | Admitting: Family Medicine

## 2020-02-01 ENCOUNTER — Encounter: Payer: PRIVATE HEALTH INSURANCE | Admitting: Internal Medicine

## 2020-02-01 VITALS — BP 112/70

## 2020-02-01 DIAGNOSIS — Z516 Encounter for desensitization to allergens: Secondary | ICD-10-CM

## 2020-02-01 NOTE — Progress Notes (Signed)
  Subjective:     Patient ID: Susan Mason, female   DOB: 1961-03-13, 59 y.o.   MRN: [id number]  HPI [name] presents to the clinic today for her weekly allergy injections. She tolerated her injections last week well without any problems. She reports feeling well, however is complaining of some fullness in her left ear that has been ongoing the last couple of weeks. She reports had some pain in that ear a few days ago but it went away. She denies any sinus pain/pressure, PND, fever, cough, shortness of breath. She reports usually she takes Allegra-D for this but forgot this morning. She has been taking her zyrtec at night.   Review of Systems  Constitutional: Negative for chills and fever.  HENT: Positive for ear pain. Negative for congestion, ear discharge, hearing loss, postnasal drip, sinus pressure, sinus pain and sore throat.   Eyes: Negative for discharge and visual disturbance.  Respiratory: Negative for cough, chest tightness and shortness of breath.   Cardiovascular: Negative for chest pain.  Gastrointestinal: Negative for nausea and vomiting.  Neurological: Negative for dizziness and headaches.       Objective:   Physical Exam Vitals reviewed.  Constitutional:      General: She is not in acute distress.    Appearance: Normal appearance.  HENT:     Head: Normocephalic and atraumatic.     Right Ear: Tympanic membrane, ear canal and external ear normal.     Left Ear: Ear canal and external ear normal. A middle ear effusion (clear fluid) is present. Tympanic membrane is not erythematous.     Nose: Nose normal.  Eyes:     General:        Right eye: No discharge.        Left eye: No discharge.  Pulmonary:     Effort: Pulmonary effort is normal. No respiratory distress.  Skin:    General: Skin is warm and dry.  Neurological:     Mental Status: She is alert and oriented to person, place, and time.  Psychiatric:        Mood and Affect: Mood normal.        Behavior: Behavior  normal.    Injection Note:Left arm serum contatins T-G-W, 0.45ml given SQ to left upper arm, Exp:12/20/2020.Right arm serum contains M-DM-C-DG-CR-F,Exp: 08/10/2020, 0.34ml given SQ to right upper arm. Tolerated procedure well. No adverse reaction noted. Pt refused to stay the thirty minutes to monitor. She has epi pen with her. Will notify if any problems     Assessment:     Allergy desensitization therapy      Plan:     1. Continue to monitor for s/s localized or systemic allergic reaction. Carry epi-pen at all times. Notify if any problems. RTC in 1 week for allergy injections. 2. Left mid ear effusion present, likely r/t allergies. Recommend treating with otc allegra-D since pt has had good results with this in the past. D/c zyrtec while on the allegra-D. If this does not fully relieve her symptoms recommend taking flonase to see if this helps, which she says she has some at home but does not currently use it. F/u if symptoms worsen, develop severe pain, fever, cough, or any other concerning symptoms.

## 2020-02-08 ENCOUNTER — Ambulatory Visit: Payer: PRIVATE HEALTH INSURANCE | Admitting: Family Medicine

## 2020-02-08 VITALS — BP 124/76 | HR 61

## 2020-02-08 DIAGNOSIS — Z516 Encounter for desensitization to allergens: Secondary | ICD-10-CM

## 2020-02-08 NOTE — Progress Notes (Signed)
  Subjective:     Patient ID: Susan Mason, female   DOB: 06-13-1961, 59 y.o.   MRN: [id number]  HPI Susan Mason presents to the [place] clinic today for her weekly allergy injections. She tolerated the ones last week without problem. She has stopped taking zyrtec and started allegra-d which she says has helped clear up her ear symptoms. She denies any other problems or concerns today. She is feeling well.   Review of Systems  Constitutional: Negative.   HENT: Negative.   Eyes: Negative.   Respiratory: Negative.   Cardiovascular: Negative.   Neurological: Negative.        Objective:   Physical Exam Vitals reviewed.  Constitutional:      General: She is not in acute distress.    Appearance: Normal appearance. She is not toxic-appearing.  HENT:     Head: Normocephalic and atraumatic.  Eyes:     General:        Right eye: No discharge.        Left eye: No discharge.  Cardiovascular:     Rate and Rhythm: Normal rate.  Pulmonary:     Effort: Pulmonary effort is normal. No respiratory distress.  Skin:    General: Skin is warm and dry.  Neurological:     Mental Status: She is alert and oriented to person, place, and time.  Psychiatric:        Mood and Affect: Mood normal.        Behavior: Behavior normal.    Today's Vitals   02/08/20 0845  BP: 124/76  Pulse: 61  SpO2: 99%   There is no height or weight on file to calculate BMI.  Injection Note:Left arm serum contatins T-G-W, 0.23ml given SQ to left upper arm, Exp:12/20/2020.Right arm serum contains M-DM-C-DG-CR-F,Exp: 12/20/2020, 0.41ml given SQ to right upper arm. Tolerated procedure well. No adverse reaction noted. Pt refused to stay the thirty minutes to monitor. She has epi pen with her. Will notify if any problems     Assessment:     Allergy desensitization therapy      Plan:     1. Continue to monitor for s/s of localized or systemic allergic reaction. Carry epi-pen at all times. Notify if any problems. RTC in  1 week.

## 2020-02-15 ENCOUNTER — Ambulatory Visit: Payer: PRIVATE HEALTH INSURANCE | Admitting: Family Medicine

## 2020-02-15 VITALS — BP 122/76

## 2020-02-15 DIAGNOSIS — Z516 Encounter for desensitization to allergens: Secondary | ICD-10-CM

## 2020-02-15 NOTE — Progress Notes (Signed)
  Subjective:     Patient ID: Susan Mason, female   DOB: 03-11-1961, 59 y.o.   MRN: [id number]  HPI Susan Mason presents to the [facility] clinic today for her weekly allergy injections. She tolerated well last week without problem. She reports she is continuing to take the Allegra-D with good effect. Denies any other problems or concerns at this visit.   Review of Systems  Constitutional: Negative.   HENT: Negative.   Eyes: Negative.   Respiratory: Negative.        Objective:   Physical Exam Vitals reviewed.  Constitutional:      General: She is not in acute distress.    Appearance: Normal appearance.  HENT:     Head: Normocephalic and atraumatic.  Eyes:     General:        Right eye: No discharge.        Left eye: No discharge.  Pulmonary:     Effort: Pulmonary effort is normal. No respiratory distress.  Skin:    General: Skin is warm and dry.  Neurological:     Mental Status: She is alert and oriented to person, place, and time.  Psychiatric:        Mood and Affect: Mood normal.        Behavior: Behavior normal.   Injection Note:Left arm serum contatins T-G-W, 0.35ml given SQ to left upper arm, Exp:12/20/2020.Right arm serum contains M-DM-C-DG-CR-F,Exp: 12/20/2020, 0.28ml given SQ to right upper arm. Tolerated procedure well. No adverse reaction noted. Pt refused to stay the thirty minutes to monitor. She has epi pen with her. Will notify if any problems      Assessment:     Allergy desensitization therapy      Plan:     1. Continue to monitor for s/s systemic or localized allergic reaction. Carry Epi-pen at all times. Notify if any problems. RTC in 1 week for allergy injections and lab work prior to pt going for her yearly physical with PCP.

## 2020-02-22 ENCOUNTER — Other Ambulatory Visit: Payer: Self-pay | Admitting: Family Medicine

## 2020-02-22 ENCOUNTER — Ambulatory Visit: Payer: PRIVATE HEALTH INSURANCE | Admitting: Family Medicine

## 2020-02-22 VITALS — BP 128/76

## 2020-02-22 DIAGNOSIS — Z516 Encounter for desensitization to allergens: Secondary | ICD-10-CM

## 2020-02-22 LAB — COMPREHENSIVE METABOLIC PANEL
Albumin: 4.4 (ref 3.5–5.0)
GFR calc Af Amer: 91
GFR calc non Af Amer: 79

## 2020-02-22 LAB — BASIC METABOLIC PANEL
BUN: 18 (ref 4–21)
Creatinine: 0.8 (ref 0.5–1.1)
Glucose: 67
Sodium: 139 (ref 137–147)

## 2020-02-22 LAB — LIPID PANEL: LDL Cholesterol: 108

## 2020-02-22 LAB — CBC AND DIFFERENTIAL: WBC: 4.7

## 2020-02-22 LAB — TSH: TSH: 0.37 — AB (ref 0.41–5.90)

## 2020-02-22 NOTE — Progress Notes (Signed)
  Subjective:     Patient ID: Susan Mason, female   DOB: 1961/03/14, 59 y.o.   MRN: [id number]  HPI Susan Mason presents to the [facility] clinic today for her weekly allergy injections. She tolerated last weeks well without problem. She is feeling well today and has no complaints. We will also draw some routine lab work today for her upcoming physical with her PCP.   Review of Systems  Constitutional: Negative.   HENT: Negative.   Eyes: Negative.   Respiratory: Negative.   Cardiovascular: Negative.        Objective:   Physical Exam Vitals reviewed.  Constitutional:      General: She is not in acute distress.    Appearance: Normal appearance. She is well-developed.  HENT:     Head: Normocephalic and atraumatic.  Eyes:     General:        Right eye: No discharge.        Left eye: No discharge.  Pulmonary:     Effort: Pulmonary effort is normal. No respiratory distress.  Skin:    General: Skin is warm and dry.  Neurological:     Mental Status: She is alert and oriented to person, place, and time.  Psychiatric:        Mood and Affect: Mood normal.        Behavior: Behavior normal.    Today's Vitals   02/22/20 0815  BP: 128/76   There is no height or weight on file to calculate BMI.  Injection Note:Left arm serum contatins T-G-W, 0.69ml given SQ to left upper arm, Exp:12/20/2020.Right arm serum contains M-DM-C-DG-CR-F,Exp:12/20/2020, 0.47ml given SQ to right upper arm. Tolerated procedure well. No adverse reaction noted. Pt refused to stay the thirty minutes to monitor. She has epi pen with her. Will notify if any problems     Assessment:     Allergy desensitization therapy      Plan:     1. Continue to monitor for s/s systemic or localized reaction. Carry Epi-pen at all times. Notify if any problems. RTC in 1 week.  2. Lab work drawn today, checked CMP, CBC, Lipid panel, A1c, and TSH and Free T4. Will notify pt of results.

## 2020-02-23 LAB — COMPLETE METABOLIC PANEL WITH GFR
AG Ratio: 1.9 (calc) (ref 1.0–2.5)
ALT: 10 U/L (ref 6–29)
AST: 17 U/L (ref 10–35)
Albumin: 4.4 g/dL (ref 3.6–5.1)
Alkaline phosphatase (APISO): 54 U/L (ref 37–153)
BUN: 18 mg/dL (ref 7–25)
CO2: 25 mmol/L (ref 20–32)
Calcium: 9.6 mg/dL (ref 8.6–10.4)
Chloride: 104 mmol/L (ref 98–110)
Creat: 0.81 mg/dL (ref 0.50–0.99)
GFR, Est African American: 91 mL/min/{1.73_m2} (ref >=60)
GFR, Est Non African American: 79 mL/min/{1.73_m2} (ref >=60)
Globulin: 2.3 g/dL (calc) (ref 1.9–3.7)
Glucose, Bld: 67 mg/dL (ref 65–99)
Potassium: 4.2 mmol/L (ref 3.5–5.3)
Sodium: 139 mmol/L (ref 135–146)
Total Bilirubin: 0.5 mg/dL (ref 0.2–1.2)
Total Protein: 6.7 g/dL (ref 6.1–8.1)

## 2020-02-23 LAB — LIPID PANEL
Cholesterol: 187 mg/dL (ref <200)
HDL: 65 mg/dL (ref >=50)
LDL Cholesterol (Calc): 108 mg/dL (calc) — ABNORMAL HIGH
Non-HDL Cholesterol (Calc): 122 mg/dL (calc) (ref <130)
Total CHOL/HDL Ratio: 2.9 (calc) (ref <5.0)
Triglycerides: 48 mg/dL (ref <150)

## 2020-02-23 LAB — TSH+FREE T4
Free T4: 1.1 ng/dL (ref 0.8–1.8)
TSH: 0.37 mIU/L — ABNORMAL LOW (ref 0.40–4.50)

## 2020-02-23 LAB — CBC
HCT: 40.1 % (ref 35.0–45.0)
Hemoglobin: 13.4 g/dL (ref 11.7–15.5)
MCH: 30.3 pg (ref 27.0–33.0)
MCHC: 33.4 g/dL (ref 32.0–36.0)
MCV: 90.7 fL (ref 80.0–100.0)
MPV: 8.8 fL (ref 7.5–12.5)
Platelets: 241 10*3/uL (ref 140–400)
RBC: 4.42 10*6/uL (ref 3.80–5.10)
RDW: 13 % (ref 11.0–15.0)
WBC: 4.7 10*3/uL (ref 3.8–10.8)

## 2020-02-23 LAB — SPECIMEN COMPROMISED

## 2020-02-23 LAB — HEMOGLOBIN A1C W/OUT EAG: Hgb A1c MFr Bld: 5.2 % of total Hgb (ref <5.7)

## 2020-02-26 ENCOUNTER — Ambulatory Visit: Payer: PRIVATE HEALTH INSURANCE

## 2020-02-29 ENCOUNTER — Ambulatory Visit: Payer: PRIVATE HEALTH INSURANCE | Admitting: Family Medicine

## 2020-02-29 VITALS — BP 118/76

## 2020-02-29 DIAGNOSIS — Z516 Encounter for desensitization to allergens: Secondary | ICD-10-CM

## 2020-02-29 NOTE — Progress Notes (Signed)
Reviewed labs with patient at her appointment. Printed a copy for her to take with her to her physical with PCP next week.

## 2020-02-29 NOTE — Progress Notes (Signed)
  Subjective:     Patient ID: Susan Mason, female   DOB: 01-13-1961, 59 y.o.   MRN: [id number]  HPI Susan Mason presents to the [facility] clinic today for her weekly allergy injections. She tolerated last week's injections well without problem. She is still taking the allegra-D and flonase with good control of her allergy symptoms. She denies any current problems or concerns.   Review of Systems  Constitutional: Negative.   HENT: Negative.   Eyes: Negative.   Respiratory: Negative.        Objective:   Physical Exam Vitals reviewed.  Constitutional:      General: She is not in acute distress.    Appearance: Normal appearance.  HENT:     Head: Normocephalic and atraumatic.  Eyes:     General:        Right eye: No discharge.        Left eye: No discharge.  Pulmonary:     Effort: Pulmonary effort is normal. No respiratory distress.  Skin:    General: Skin is warm and dry.  Neurological:     Mental Status: She is alert and oriented to person, place, and time.  Psychiatric:        Mood and Affect: Mood normal.        Behavior: Behavior normal.    Today's Vitals   02/29/20 0815  BP: 118/76   There is no height or weight on file to calculate BMI. Injection Note:Left arm serum contatins T-G-W, 0.47ml given SQ to left upper arm, Exp:12/20/2020.Right arm serum contains M-DM-C-DG-CR-F,Exp:12/20/2020, 0.50ml given SQ to right upper arm. Tolerated procedure well. No adverse reaction noted. Pt refused to stay the thirty minutes to monitor. She has epi pen with her. Will notify if any problems      Assessment:     Allergy desensitization therapy      Plan:     1. Continue to monitor for s/s localized or systemic allergic reaction. Carry Epi-pen at all times. Notify if any problems. RTC in 1 week.  2. Reviewed lab work with patient, gave her a printed copy she can take to PCP next week to review during her physical.

## 2020-03-06 ENCOUNTER — Encounter: Payer: Self-pay | Admitting: Internal Medicine

## 2020-03-06 ENCOUNTER — Ambulatory Visit
Admission: RE | Admit: 2020-03-06 | Discharge: 2020-03-06 | Disposition: A | Discharge status: Home / Self Care | Payer: PRIVATE HEALTH INSURANCE | Source: Ambulatory Visit | Attending: Internal Medicine | Admitting: Internal Medicine

## 2020-03-06 ENCOUNTER — Other Ambulatory Visit (HOSPITAL_COMMUNITY)
Admission: RE | Admit: 2020-03-06 | Discharge: 2020-03-06 | Disposition: A | Discharge status: Home / Self Care | Payer: PRIVATE HEALTH INSURANCE | Source: Ambulatory Visit | Attending: Internal Medicine | Admitting: Internal Medicine

## 2020-03-06 ENCOUNTER — Ambulatory Visit: Payer: PRIVATE HEALTH INSURANCE | Admitting: Internal Medicine

## 2020-03-06 ENCOUNTER — Other Ambulatory Visit: Payer: Self-pay

## 2020-03-06 VITALS — BP 122/64 | HR 96 | Temp 98.1°F | Ht 60.6 in | Wt 102.2 lb

## 2020-03-06 DIAGNOSIS — Z1231 Encounter for screening mammogram for malignant neoplasm of breast: Secondary | ICD-10-CM

## 2020-03-06 DIAGNOSIS — Z01419 Encounter for gynecological examination (general) (routine) without abnormal findings: Secondary | ICD-10-CM | POA: Insufficient documentation

## 2020-03-06 DIAGNOSIS — Z0000 Encounter for general adult medical examination without abnormal findings: Secondary | ICD-10-CM | POA: Diagnosis not present

## 2020-03-06 DIAGNOSIS — I10 Essential (primary) hypertension: Secondary | ICD-10-CM | POA: Diagnosis not present

## 2020-03-06 LAB — POCT URINALYSIS DIPSTICK
Bilirubin, UA: NEGATIVE
Blood, UA: NEGATIVE
Glucose, UA: NEGATIVE
Ketones, UA: NEGATIVE
Leukocytes, UA: NEGATIVE
Nitrite, UA: NEGATIVE
Protein, UA: NEGATIVE
Spec Grav, UA: 1.015 (ref 1.010–1.025)
Urobilinogen, UA: 0.2 E.U./dL
pH, UA: 7.5 (ref 5.0–8.0)

## 2020-03-06 LAB — POCT UA - MICROALBUMIN
Creatinine, POC: 50 mg/dL
Microalbumin Ur, POC: 30 mg/L

## 2020-03-06 MED ORDER — LISINOPRIL 5 MG PO TABS: 5.0000 mg | ORAL_TABLET | Freq: Every day | ORAL | 2 refills | Status: DC

## 2020-03-06 NOTE — Progress Notes (Signed)
I,Tianna Badgett,acting as a Neurosurgeon for [name], MD.,have documented all relevant documentation on the behalf of [name], MD,as directed by  [name], MD while in the presence of [name], MD.  This visit occurred during the SARS-CoV-2 public health emergency.  Safety protocols were in place, including screening questions prior to the visit, additional usage of staff PPE, and extensive cleaning of exam room while observing appropriate contact time as indicated for disinfecting solutions.  Subjective:     Patient ID: Susan Mason , female    DOB: 1960-11-03 , 59 y.o.   MRN: [id number]   Chief Complaint  Patient presents with  . Annual Exam    with pap  . Hypertension    HPI  She is here today for a full physical examination. She would like a pap smear. She would like to discuss options concerning cyst on thyroid.   Hypertension This is a chronic problem. The current episode started more than 1 year ago. The problem has been gradually improving since onset. The problem is controlled. Pertinent negatives include no blurred vision, chest pain, palpitations or shortness of breath. Risk factors for coronary artery disease include post-menopausal state. Past treatments include ACE inhibitors. The current treatment provides moderate improvement. There are no compliance problems.      Past Medical History:  Diagnosis Date  . Abnormal Pap smear 2008   ASCUS  . High blood pressure   . Metrorrhagia   . Osteopenia   . Ovarian cyst   . Yeast vaginitis      Family History  Problem Relation Age of Onset  . Hyperlipidemia Mother   . Hypertension Mother   . Heart Problems Mother   . Kidney disease Mother   . Lung cancer Father   . Emphysema Father   . Breast cancer Neg Hx      Current Outpatient Medications:  .  cetirizine (ZYRTEC) 10 MG tablet, Take 10 mg by mouth daily. , Disp: , Rfl:  .  cholecalciferol (VITAMIN D) 1000 UNITS tablet, Take 1,000  Units by mouth daily., Disp: , Rfl:  .  fexofenadine-pseudoephedrine (ALLEGRA-D 24) 180-240 MG 24 hr tablet, Take 1 tablet by mouth daily., Disp: , Rfl:  .  lisinopril (ZESTRIL) 5 MG tablet, Take 1 tablet (5 mg total) by mouth daily., Disp: 90 tablet, Rfl: 2 .  Multiple Vitamins-Minerals (MULTIVITAMIN WITH MINERALS) tablet, Take 1 tablet by mouth daily., Disp: , Rfl:  .  Probiotic Product (PROBIOTIC-10 PO), Take 1 tablet by mouth daily., Disp: , Rfl:    Allergies  Allergen Reactions  . Penicillins   . Doxycycline   . Erythromycin   . Erythromycin Base       The patient states she uses post menopausal status for birth control. Last LMP was No LMP recorded. Patient is postmenopausal.. Negative for Dysmenorrhea. Negative for: breast discharge, breast lump(s), breast pain and breast self exam. Associated symptoms include abnormal vaginal bleeding. Pertinent negatives include abnormal bleeding (hematology), anxiety, decreased libido, depression, difficulty falling sleep, dyspareunia, history of infertility, nocturia, sexual dysfunction, sleep disturbances, urinary incontinence, urinary urgency, vaginal discharge and vaginal itching. Diet regular.The patient states her exercise level is  intermittent.   . The patient's tobacco use is:  Social History   Tobacco Use  Smoking Status Never Smoker  Smokeless Tobacco Never Used  . She has been exposed to passive smoke. The patient's alcohol use is:  Social History   Substance and Sexual Activity  Alcohol  Use No    Review of Systems  Constitutional: Negative.   HENT: Negative.   Eyes: Negative.  Negative for blurred vision.  Respiratory: Negative.  Negative for shortness of breath.   Cardiovascular: Negative.  Negative for chest pain and palpitations.  Gastrointestinal: Negative.   Endocrine: Negative.   Genitourinary: Negative.   Musculoskeletal: Negative.   Skin: Negative.   Allergic/Immunologic: Negative.   Neurological: Negative.    Hematological: Negative.   Psychiatric/Behavioral: Negative.      Today's Vitals   03/06/20 1518  BP: 122/64  Pulse: 96  Temp: 98.1 F (36.7 C)  TempSrc: Oral  Weight: 102 lb 3.2 oz (46.4 kg)  Height: 5' 0.6" (1.539 m)   Body mass index is 19.57 kg/m.   Objective:  Physical Exam Vitals and nursing note reviewed.  Constitutional:      General: She is not in acute distress.    Appearance: Normal appearance. She is well-developed.  HENT:     Head: Normocephalic and atraumatic.     Right Ear: Hearing, tympanic membrane, ear canal and external ear normal. There is no impacted cerumen.     Left Ear: Hearing, tympanic membrane, ear canal and external ear normal. There is no impacted cerumen.     Nose:     Comments: Deferred, ,masked    Mouth/Throat:     Comments: Deferred, masked Eyes:     General: Lids are normal.     Extraocular Movements: Extraocular movements intact.     Conjunctiva/sclera: Conjunctivae normal.     Pupils: Pupils are equal, round, and reactive to light.     Funduscopic exam:    Right eye: No papilledema.        Left eye: No papilledema.  Neck:     Thyroid: No thyroid mass.     Vascular: No carotid bruit.  Cardiovascular:     Rate and Rhythm: Normal rate and regular rhythm.     Pulses: Normal pulses.     Heart sounds: Normal heart sounds. No murmur heard.   Pulmonary:     Effort: Pulmonary effort is normal.     Breath sounds: Normal breath sounds.  Chest:     Breasts:        Right: Normal.        Left: Normal.  Abdominal:     General: Abdomen is flat. Bowel sounds are normal. There is no distension.     Palpations: Abdomen is soft.     Tenderness: There is no abdominal tenderness.     Hernia: There is no hernia in the left inguinal area or right inguinal area.  Genitourinary:    General: Normal vulva.     Exam position: Lithotomy position.     Labia:        Right: No tenderness.        Left: No tenderness.      Vagina: Normal.      Cervix: Normal.     Uterus: Normal.      Adnexa: Right adnexa normal and left adnexa normal.     Rectum: Normal. Guaiac result negative.     Comments: Stenotic cervix Musculoskeletal:        General: No swelling. Normal range of motion.     Cervical back: Full passive range of motion without pain, normal range of motion and neck supple.     Right lower leg: No edema.     Left lower leg: No edema.  Lymphadenopathy:     Lower Body: No  right inguinal adenopathy. No left inguinal adenopathy.  Skin:    General: Skin is warm and dry.     Capillary Refill: Capillary refill takes less than 2 seconds.  Neurological:     General: No focal deficit present.     Mental Status: She is alert and oriented to person, place, and time.     Cranial Nerves: No cranial nerve deficit.     Sensory: No sensory deficit.  Psychiatric:        Mood and Affect: Mood normal.        Behavior: Behavior normal.        Thought Content: Thought content normal.        Judgment: Judgment normal.         Assessment And Plan:     1. Routine general medical examination at a health care facility Comments: A full exam was performed. Importance of monthly self breast exams was discussed with the patient. She had labwork performed at work. All results were reviewed in full detail. PATIENT IS ADVISED TO GET 30-45 MINUTES REGULAR EXERCISE NO LESS THAN FOUR TO FIVE DAYS PER WEEK - BOTH WEIGHTBEARING EXERCISES AND AEROBIC ARE RECOMMENDED.  PATIENT IS ADVISED TO FOLLOW A HEALTHY DIET WITH AT LEAST SIX FRUITS/VEGGIES PER DAY, DECREASE INTAKE OF RED MEAT, AND TO INCREASE FISH INTAKE TO TWO DAYS PER WEEK.  MEATS/FISH SHOULD NOT BE FRIED, BAKED OR BROILED IS PREFERABLE.  I SUGGEST WEARING SPF 50 SUNSCREEN ON EXPOSED PARTS AND ESPECIALLY WHEN IN THE DIRECT SUNLIGHT FOR AN EXTENDED PERIOD OF TIME.  PLEASE AVOID FAST FOOD RESTAURANTS AND INCREASE YOUR WATER INTAKE.   2. Cervical smear, as part of routine gynecological  examination Comments: Pap smear performed. Hemoccult negative.  - Cytology -Pap Smear  3. Essential hypertension Comments: Chronic, well controlled. She will continue with current meds. She is encouraged to avoid adding salt to her foods. EKG performed, NSR w/o acute changes. She will rto in six months for re-evaluation.   - POCT Urinalysis Dipstick (81002) - POCT UA - Microalbumin - EKG 12-Lead - lisinopril (ZESTRIL) 5 MG tablet; Take 1 tablet (5 mg total) by mouth daily.  Dispense: 90 tablet; Refill: 2   Patient was given opportunity to ask questions. Patient verbalized understanding of the plan and was able to repeat key elements of the plan. All questions were answered to their satisfaction.   [name], MD   I, [name], MD, have reviewed all documentation for this visit. The documentation on 03/16/20 for the exam, diagnosis, procedures, and orders are all accurate and complete.  THE PATIENT IS ENCOURAGED TO PRACTICE SOCIAL DISTANCING DUE TO THE COVID-19 PANDEMIC.

## 2020-03-07 ENCOUNTER — Ambulatory Visit: Payer: PRIVATE HEALTH INSURANCE | Admitting: Family Medicine

## 2020-03-07 VITALS — BP 112/78

## 2020-03-07 DIAGNOSIS — Z516 Encounter for desensitization to allergens: Secondary | ICD-10-CM

## 2020-03-07 NOTE — Progress Notes (Signed)
  Subjective:     Patient ID: Susan Mason, female   DOB: 11-Sep-1960, 59 y.o.   MRN: [id number]  HPI Susan Mason presents to the [facility] clinic today for her weekly allergy injections. She tolerated last weeks without problem. She denies any current problems or concerns today. She is feeling well.   Review of Systems  Constitutional: Negative.   HENT: Negative.   Eyes: Negative.   Respiratory: Negative.        Objective:   Physical Exam Vitals reviewed.  Constitutional:      General: She is not in acute distress.    Appearance: Normal appearance.  HENT:     Head: Normocephalic and atraumatic.  Eyes:     General:        Right eye: No discharge.        Left eye: No discharge.  Pulmonary:     Effort: Pulmonary effort is normal. No respiratory distress.  Skin:    General: Skin is warm and dry.  Neurological:     Mental Status: She is alert and oriented to person, place, and time.  Psychiatric:        Mood and Affect: Mood normal.        Behavior: Behavior normal.    Today's Vitals   03/07/20 0825  BP: 112/78   There is no height or weight on file to calculate BMI.  Injection Note:Left arm serum contatins T-G-W, 0.45ml given SQ to left upper arm, Exp:12/20/2020.Right arm serum contains M-DM-C-DG-CR-F,Exp:12/20/2020, 0.15ml given SQ to right upper arm. Tolerated procedure well. No adverse reaction noted. Pt refused to stay the thirty minutes to monitor. She has epi pen with her. Will notify if any problems     Assessment:     Allergy desensitization therapy      Plan:     1. Continue to monitor for s/s systemic or localized allergic reaction. Carry Epi-pen at all times. Notify if any problems. RTC in 1 week.

## 2020-03-08 LAB — CYTOLOGY - PAP
Comment: NEGATIVE
Diagnosis: NEGATIVE
High risk HPV: NEGATIVE

## 2020-03-11 ENCOUNTER — Telehealth: Payer: Self-pay

## 2020-03-11 ENCOUNTER — Other Ambulatory Visit: Payer: Self-pay

## 2020-03-11 NOTE — Telephone Encounter (Signed)
-----   Message from Dorothyann Peng, MD sent at 03/10/2020  3:58 PM EDT ----- Here are your lab results:  Your pap smear is NORMAL.  You will have another one in 3-5 years.   Please let me know if you have any questions or concerns. Stay safe!   Sincerely,    Robyn N. Allyne Gee, MD

## 2020-03-14 ENCOUNTER — Ambulatory Visit: Payer: PRIVATE HEALTH INSURANCE | Admitting: Family Medicine

## 2020-03-14 VITALS — BP 120/72

## 2020-03-14 DIAGNOSIS — Z516 Encounter for desensitization to allergens: Secondary | ICD-10-CM

## 2020-03-14 NOTE — Progress Notes (Signed)
  Subjective:     Patient ID: Susan Mason, female   DOB: [birth year], 59 y.o.   MRN: [id number]  HPI Appolonia presents to the [facility] clinic today for her weekly allergy injections. She tolerated well last week without problem. She is feeling well today and denies any problems or concerns.   Review of Systems  Constitutional: Negative.   HENT: Negative.   Eyes: Negative.   Respiratory: Negative.        Objective:   Physical Exam Vitals reviewed.  Constitutional:      General: She is not in acute distress.    Appearance: Normal appearance. She is well-developed.  HENT:     Head: Normocephalic and atraumatic.  Eyes:     General:        Right eye: No discharge.        Left eye: No discharge.  Pulmonary:     Effort: Pulmonary effort is normal. No respiratory distress.  Skin:    General: Skin is warm and dry.  Neurological:     Mental Status: She is alert and oriented to person, place, and time.  Psychiatric:        Mood and Affect: Mood normal.        Behavior: Behavior normal.    Today's Vitals   03/14/20 0830  BP: 120/72   There is no height or weight on file to calculate BMI.  Injection Note:Left arm serum contatins T-G-W, 0.45ml given SQ to left upper arm, Exp:12/20/2020.Right arm serum contains M-DM-C-DG-CR-F,Exp:12/20/2020, 0.86ml given SQ to right upper arm. Tolerated procedure well. No adverse reaction noted. Pt refused to stay the thirty minutes to monitor. She has epi pen with her. Will notify if any problems      Assessment:     Allergy desensitization therapy      Plan:     1. Continue to monitor for s/s systemic or localized allergic reaction. Carry Epi-pen at all times. Notify if any problems. RTC in 1 week.

## 2020-03-16 LAB — POC HEMOCCULT BLD/STL (OFFICE/1-CARD/DIAGNOSTIC)
Card #1 Date: 8042021
Fecal Occult Blood, POC: NEGATIVE

## 2020-03-18 NOTE — Progress Notes (Signed)
You can put in order for bone density. 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

## 2020-03-20 ENCOUNTER — Other Ambulatory Visit: Payer: Self-pay

## 2020-03-20 DIAGNOSIS — E2839 Other primary ovarian failure: Secondary | ICD-10-CM

## 2020-03-21 ENCOUNTER — Ambulatory Visit: Payer: PRIVATE HEALTH INSURANCE | Admitting: Family Medicine

## 2020-03-21 VITALS — BP 128/80

## 2020-03-21 DIAGNOSIS — Z516 Encounter for desensitization to allergens: Secondary | ICD-10-CM

## 2020-03-21 NOTE — Progress Notes (Signed)
  Subjective:     Patient ID: Susan Mason, female   DOB: 07/26/1961, 59 y.o.   MRN: [id number]  HPI Susan Mason presents to the [facility] clinic today for her weekly allergy injections. She tolerated last weeks well without problem. She is feeling well today and has no complaints.   Review of Systems  Constitutional: Negative.   HENT: Negative.   Eyes: Negative.   Respiratory: Negative.   Cardiovascular: Negative.   Gastrointestinal: Negative.        Objective:   Physical Exam Vitals reviewed.  Constitutional:      General: She is not in acute distress.    Appearance: Normal appearance. She is well-developed.  HENT:     Head: Normocephalic and atraumatic.  Eyes:     General:        Right eye: No discharge.        Left eye: No discharge.  Pulmonary:     Effort: Pulmonary effort is normal. No respiratory distress.  Skin:    General: Skin is warm and dry.  Neurological:     Mental Status: She is alert and oriented to person, place, and time.  Psychiatric:        Mood and Affect: Mood normal.        Behavior: Behavior normal.    Today's Vitals   03/21/20 0832  BP: 128/80   There is no height or weight on file to calculate BMI.  Injection Note:Left arm serum contatins T-G-W, 0.34ml given SQ to left upper arm, Exp:12/20/2020.Right arm serum contains M-DM-C-DG-CR-F,Exp:12/20/2020, 0.72ml given SQ to right upper arm. Tolerated procedure well. No adverse reaction noted. Pt refused to stay the thirty minutes to monitor. She has epi pen with her. Will notify if any problems    Assessment:     Allergy desensitization therapy      Plan:     1. Continue to monitor for s/s systemic or localized allergic reaction. Carry epi-pen at all times. Notify if any problems. RTC in 1 week.

## 2020-03-28 ENCOUNTER — Ambulatory Visit: Payer: PRIVATE HEALTH INSURANCE | Admitting: Family Medicine

## 2020-03-28 VITALS — BP 114/70

## 2020-03-28 DIAGNOSIS — Z516 Encounter for desensitization to allergens: Secondary | ICD-10-CM

## 2020-03-28 NOTE — Progress Notes (Signed)
  Subjective:     Patient ID: Susan Mason, female   DOB: January 30, 1961, 59 y.o.   MRN: [id number]  HPI Susan Mason presents to the [facility] clinic today for her weekly allergy injections. She tolerated last weeks well without problem. She denies any current problems or concerns.   Review of Systems  Constitutional: Negative.   HENT: Negative.   Eyes: Negative.   Respiratory: Negative.   Cardiovascular: Negative.        Objective:   Physical Exam Vitals reviewed.  Constitutional:      General: She is not in acute distress.    Appearance: Normal appearance. She is well-developed.  HENT:     Head: Normocephalic and atraumatic.  Eyes:     General:        Right eye: No discharge.        Left eye: No discharge.  Pulmonary:     Effort: Pulmonary effort is normal. No respiratory distress.  Skin:    General: Skin is warm and dry.  Neurological:     Mental Status: She is alert and oriented to person, place, and time.  Psychiatric:        Mood and Affect: Mood normal.        Behavior: Behavior normal.    Today's Vitals   03/28/20 0852  BP: 114/70   There is no height or weight on file to calculate BMI.  Injection Note:Left arm serum contatins T-G-W, Exp:12/20/2020, was only able to administer 0.1 ml today as pt ran out of serum and needs to order more from her allergist.Right arm serum contains M-DM-C-DG-CR-F,Exp:12/20/2020, 0.4ml given SQ to right upper arm. Tolerated procedure well. No adverse reaction noted. Pt refused to stay the thirty minutes to monitor. She has epi pen with her. Will notify if any problems     Assessment:     Allergy desensitization therapy      Plan:     1. Continue to monitor for s/s allergic reaction. Carry Epi-pen at all times. Notify if any problems.

## 2020-04-11 ENCOUNTER — Ambulatory Visit: Payer: PRIVATE HEALTH INSURANCE | Admitting: Family Medicine

## 2020-04-11 VITALS — BP 120/72

## 2020-04-11 DIAGNOSIS — Z516 Encounter for desensitization to allergens: Secondary | ICD-10-CM

## 2020-04-11 NOTE — Progress Notes (Signed)
  Subjective:     Patient ID: Susan Mason, female   DOB: November 12, 1960, 59 y.o.   MRN: [id number]  HPI Susan Mason presents to the [facility] clinic today for her weekly allergy injections. She tolerated last weeks well without problem. She denies any current problems or concerns.   Review of Systems  Constitutional: Negative.   HENT: Negative.   Eyes: Negative.   Respiratory: Negative.   Cardiovascular: Negative.   Gastrointestinal: Negative.        Objective:   Physical Exam Vitals reviewed.  Constitutional:      General: She is not in acute distress.    Appearance: Normal appearance. She is well-developed.  HENT:     Head: Normocephalic and atraumatic.  Eyes:     General:        Right eye: No discharge.        Left eye: No discharge.  Pulmonary:     Effort: Pulmonary effort is normal. No respiratory distress.  Skin:    General: Skin is warm and dry.  Neurological:     Mental Status: She is alert and oriented to person, place, and time.  Psychiatric:        Mood and Affect: Mood normal.        Behavior: Behavior normal.    Today's Vitals   04/11/20 0830  BP: 120/72   There is no height or weight on file to calculate BMI.  Injection Note:Left arm serum contatins T-G-W, Exp:03/28/2021, new vial, 0.2 ml given SQ to left upper arm.Right arm serum contains M-DM-C-DG-CR-F,Exp:12/20/2020, 0.34ml given SQ to right upper arm. Tolerated procedure well. No adverse reaction noted. Pt refused to stay the thirty minutes to monitor. She has epi pen with her. Will notify if any problems     Assessment:     Allergy desensitization therapy      Plan:     Continue to monitor for any s/s allergic reaction. Carry epi-pen at all times. Notify if any problems. Schedule f/u with allergist. RTC in 1 week.

## 2020-04-18 ENCOUNTER — Ambulatory Visit: Payer: PRIVATE HEALTH INSURANCE | Admitting: Family Medicine

## 2020-04-18 VITALS — BP 118/78

## 2020-04-18 DIAGNOSIS — Z516 Encounter for desensitization to allergens: Secondary | ICD-10-CM

## 2020-04-18 NOTE — Progress Notes (Signed)
  Subjective:     Patient ID: Susan Mason, female   DOB: 1961-05-30, 59 y.o.   MRN: [id number]  HPI Diona presents to the [facility] clinic today for her weekly allergy injections. She tolerated last weeks well without problem. She denies any current problems or concerns.  Review of Systems  Constitutional: Negative.   HENT: Negative.   Eyes: Negative.   Respiratory: Negative.   Cardiovascular: Negative.   Gastrointestinal: Negative.        Objective:   Physical Exam Vitals reviewed.  Constitutional:      General: She is not in acute distress.    Appearance: Normal appearance. She is well-developed.  HENT:     Head: Normocephalic and atraumatic.  Eyes:     General:        Right eye: No discharge.        Left eye: No discharge.  Pulmonary:     Effort: Pulmonary effort is normal. No respiratory distress.  Skin:    General: Skin is warm and dry.  Neurological:     Mental Status: She is alert and oriented to person, place, and time.  Psychiatric:        Mood and Affect: Mood normal.        Behavior: Behavior normal.   Injection Note:Left arm serum contatins T-G-W, Exp:03/28/2021, new vial, 0.2 ml given SQ to left upper arm.Right arm serum contains M-DM-C-DG-CR-F,Exp:12/20/2020, 0.102ml given SQ to right upper arm. Tolerated procedure well. No adverse reaction noted. Pt refused to stay the thirty minutes to monitor. She has epi pen with her. Will notify if any problems     Assessment:     Allergy desensitization therapy      Plan:     Continue to monitor for any s/s allergic reaction. Carry epi-pen at all times. Notify if any problems. Schedule f/u with allergist. RTC in 1 week.

## 2020-04-25 ENCOUNTER — Ambulatory Visit: Payer: PRIVATE HEALTH INSURANCE

## 2020-05-02 ENCOUNTER — Ambulatory Visit: Payer: PRIVATE HEALTH INSURANCE | Admitting: Family Medicine

## 2020-05-02 VITALS — BP 124/82

## 2020-05-02 DIAGNOSIS — Z516 Encounter for desensitization to allergens: Secondary | ICD-10-CM

## 2020-05-02 NOTE — Progress Notes (Signed)
  Subjective:     Patient ID: Susan Mason, female   DOB: 22-Oct-1960, 59 y.o.   MRN: [id number]  HPI Susan Mason presents to the [facility] clinic today for her weekly allergy injections. She missed last week's dose d/t being out of town, but did well with the previous dose. She reports feeling well. Denies any problems or concerns.   Review of Systems  Constitutional: Negative.   HENT: Negative.   Eyes: Negative.   Respiratory: Negative.        Objective:   Physical Exam Vitals reviewed.  Constitutional:      General: She is not in acute distress.    Appearance: Normal appearance. She is well-developed.  HENT:     Head: Normocephalic and atraumatic.  Eyes:     General:        Right eye: No discharge.        Left eye: No discharge.  Pulmonary:     Effort: Pulmonary effort is normal. No respiratory distress.  Skin:    General: Skin is warm and dry.  Neurological:     Mental Status: She is alert and oriented to person, place, and time.  Psychiatric:        Mood and Affect: Mood normal.        Behavior: Behavior normal.    Injection Note:Left arm serum contatins T-G-W, Exp:03/28/2021,new vial, 0.4 ml given SQ to leftupper arm.Right arm serum contains M-DM-C-DG-CR-F,0.53ml given SQ to right upper arm. Tolerated procedure well. No adverse reaction noted. Pt refused to stay the thirty minutes to monitor. She has epi pen with her. Will notify if any problems      Assessment:     Allergy desensitization therapy      Plan:     Continue to monitor for any s/s allergic reaction. Carry epi-pen at all times. Notify if any problems. Schedule f/u with allergist. RTC in 1 week

## 2020-05-09 ENCOUNTER — Ambulatory Visit: Payer: PRIVATE HEALTH INSURANCE

## 2020-05-16 ENCOUNTER — Ambulatory Visit: Payer: PRIVATE HEALTH INSURANCE | Admitting: Family Medicine

## 2020-05-16 VITALS — BP 128/78

## 2020-05-16 DIAGNOSIS — Z516 Encounter for desensitization to allergens: Secondary | ICD-10-CM

## 2020-05-16 NOTE — Progress Notes (Signed)
  Subjective:     Patient ID: Susan Mason, female   DOB: 29-Aug-1960, 59 y.o.   MRN: [id number]  HPI Takiera presents to the [facility] clinic today for her weekly allergy injections. She tolerated last weeks dose well without any problem. She is feeling well. Denies any problems or concerns today.  Review of Systems  Constitutional: Negative.   HENT: Negative.   Eyes: Negative.   Respiratory: Negative.        Objective:   Physical Exam Vitals reviewed.  Constitutional:      General: She is not in acute distress.    Appearance: Normal appearance. She is well-developed.  HENT:     Head: Normocephalic and atraumatic.  Eyes:     General:        Right eye: No discharge.        Left eye: No discharge.  Pulmonary:     Effort: Pulmonary effort is normal. No respiratory distress.  Skin:    General: Skin is warm and dry.  Neurological:     Mental Status: She is alert and oriented to person, place, and time.  Psychiatric:        Mood and Affect: Mood normal.        Behavior: Behavior normal.   Injection Note:Left arm serum contatins T-G-W, Exp:03/28/2021,new vial, 0.5 ml given SQ to leftupper arm.Right arm serum contains M-DM-C-DG-CR-F,0.45ml given SQ to right upper arm. Tolerated procedure well. No adverse reaction noted. Pt refused to stay the thirty minutes to monitor. She has epi pen with her. Will notify if any problems      Assessment:     Allergy desensitization therapy      Plan:     1. Continue to monitor for s/s allergic reaction. Carry Epi-pen at all times. Notify if any problems. RTC in 1 week.

## 2020-05-23 ENCOUNTER — Ambulatory Visit: Payer: PRIVATE HEALTH INSURANCE | Admitting: Family Medicine

## 2020-05-23 VITALS — BP 126/76

## 2020-05-23 DIAGNOSIS — Z516 Encounter for desensitization to allergens: Secondary | ICD-10-CM

## 2020-05-23 NOTE — Progress Notes (Signed)
  Subjective:     Patient ID: Susan Mason, female   DOB: Sep 10, 1960, 59 y.o.   MRN: [id number]  HPI Ginnette presents to the [facility] clinic today for her weekly allergy injections. She tolerated well last week without problem. She reports feeling well today and has no complaints.  Review of Systems  Constitutional: Negative.   HENT: Negative.   Eyes: Negative.   Respiratory: Negative.   Cardiovascular: Negative.   Gastrointestinal: Negative.        Objective:   Physical Exam Vitals reviewed.  Constitutional:      General: She is not in acute distress.    Appearance: Normal appearance. She is well-developed.  HENT:     Head: Normocephalic and atraumatic.  Eyes:     General:        Right eye: No discharge.        Left eye: No discharge.  Pulmonary:     Effort: Pulmonary effort is normal. No respiratory distress.  Skin:    General: Skin is warm and dry.  Neurological:     Mental Status: She is alert and oriented to person, place, and time.  Psychiatric:        Mood and Affect: Mood normal.        Behavior: Behavior normal.   Injection Note:Left arm serum contatins T-G-W, Exp:03/28/2021, 0.9ml given SQ to leftupper arm.Right arm serum contains M-DM-C-DG-CR-F,Exp: 03/28/2021, 0.71ml given SQ to right upper arm. Tolerated procedure well. No adverse reaction noted. Pt refused to stay the thirty minutes to monitor. She has epi pen with her. Will notify if any problems     Assessment:     Allergy desensitization therapy      Plan:     1. Continue to monitor for s/s allergic reaction. Carry Epi-pen at all times. Notify if any problems. RTC in 1 week.

## 2020-05-30 ENCOUNTER — Ambulatory Visit: Payer: PRIVATE HEALTH INSURANCE | Admitting: Family Medicine

## 2020-05-30 VITALS — BP 132/78

## 2020-05-30 DIAGNOSIS — Z516 Encounter for desensitization to allergens: Secondary | ICD-10-CM

## 2020-05-30 NOTE — Progress Notes (Signed)
  Subjective:     Patient ID: Susan Mason, female   DOB: [birth year], 59 y.o.   MRN: [id number]  HPI Susan Mason presents to the [facility] clinic today for her weekly allergy injections. She tolerated well last week without problem. She reports feeling well today and has no complaints.  Review of Systems  Constitutional: Negative.   HENT: Negative.   Respiratory: Negative.   Cardiovascular: Negative.   Gastrointestinal: Negative.        Objective:   Physical Exam Vitals reviewed.  Constitutional:      General: She is not in acute distress.    Appearance: Normal appearance. She is well-developed.  HENT:     Head: Normocephalic and atraumatic.  Eyes:     General:        Right eye: No discharge.        Left eye: No discharge.  Pulmonary:     Effort: Pulmonary effort is normal. No respiratory distress.  Skin:    General: Skin is warm and dry.  Neurological:     Mental Status: She is alert and oriented to person, place, and time.  Psychiatric:        Mood and Affect: Mood normal.        Behavior: Behavior normal.   Injection Note:Left arm serum contatins T-G-W, Exp:03/28/2021, 0.47ml given SQ to leftupper arm.Right arm serum contains M-DM-C-DG-CR-F,Exp: 03/28/2021, 0.23ml given SQ to right upper arm. Tolerated procedure well. No adverse reaction noted. Pt refused to stay the thirty minutes to monitor. She has epi pen with her. Will notify if any problems     Assessment:     Allergy desensitization therapy      Plan:     1. Continue to monitor for s/s allergic reaction. Carry epi-pen at all times. Notify if any problems. RTC in 1 week.

## 2020-06-06 ENCOUNTER — Ambulatory Visit: Payer: PRIVATE HEALTH INSURANCE | Admitting: Family Medicine

## 2020-06-06 VITALS — BP 122/72

## 2020-06-06 DIAGNOSIS — Z516 Encounter for desensitization to allergens: Secondary | ICD-10-CM

## 2020-06-06 NOTE — Progress Notes (Signed)
  Subjective:     Patient ID: Susan Mason, female   DOB: 1961-05-24, 59 y.o.   MRN: [id number]  HPI Susan Mason presents to the [facility] clinic today for her weekly allergy injections. She tolerated last weeks well without problem. She is feeling well and has no concerns today.   Review of Systems  Constitutional: Negative.   HENT: Negative.   Eyes: Negative.   Respiratory: Negative.   Cardiovascular: Negative.   Gastrointestinal: Negative.        Objective:   Physical Exam Vitals reviewed.  Constitutional:      General: She is not in acute distress.    Appearance: Normal appearance. She is well-developed.  HENT:     Head: Normocephalic and atraumatic.  Eyes:     General:        Right eye: No discharge.        Left eye: No discharge.  Pulmonary:     Effort: Pulmonary effort is normal. No respiratory distress.  Skin:    General: Skin is warm and dry.  Neurological:     Mental Status: She is alert and oriented to person, place, and time.  Psychiatric:        Mood and Affect: Mood normal.        Behavior: Behavior normal.   Injection Note:Left arm serum contatins T-G-W, Exp:03/28/2021, 0.64ml given SQ to leftupper arm.Right arm serum contains M-DM-C-DG-CR-F,Exp: 03/28/2021,0.62ml given SQ to right upper arm. Tolerated procedure well. No adverse reaction noted. Pt refused to stay the thirty minutes to monitor. She has epi pen with her. Will notify if any problems      Assessment:     Allergy desensitization therapy      Plan:     1. Continue to monitor for s/s allergic reaction. Carry epi-pen at all times. Notify if any problems. RTC in 1 week.

## 2020-06-13 ENCOUNTER — Other Ambulatory Visit: Payer: Self-pay | Admitting: Family Medicine

## 2020-06-13 ENCOUNTER — Ambulatory Visit: Payer: PRIVATE HEALTH INSURANCE | Admitting: Family Medicine

## 2020-06-13 VITALS — BP 122/82

## 2020-06-13 DIAGNOSIS — Z516 Encounter for desensitization to allergens: Secondary | ICD-10-CM

## 2020-06-13 LAB — TSH: TSH: 0.4 mIU/L (ref 0.40–4.50)

## 2020-06-13 LAB — VITAMIN D 25 HYDROXY (VIT D DEFICIENCY, FRACTURES): Vit D, 25-Hydroxy: 46 ng/mL (ref 30–100)

## 2020-06-13 NOTE — Progress Notes (Signed)
  Subjective:     Patient ID: Susan Mason, female   DOB: February 08, 1961, 59 y.o.   MRN: [id number]  HPI Susan Mason presents to the [facility] clinic today for her weekly allergy injections. She tolerated last weeks well without problem. She feels well today and denies any problems or concerns. We will also draw labs today to check TSH and Vitamin D level.   Review of Systems  Constitutional: Negative.   HENT: Negative.   Eyes: Negative.   Respiratory: Negative.   Cardiovascular: Negative.   Gastrointestinal: Negative.        Objective:   Physical Exam Vitals reviewed.  Constitutional:      General: She is not in acute distress.    Appearance: Normal appearance. She is well-developed.  HENT:     Head: Normocephalic and atraumatic.  Eyes:     General:        Right eye: No discharge.        Left eye: No discharge.  Pulmonary:     Effort: Pulmonary effort is normal. No respiratory distress.  Skin:    General: Skin is warm and dry.  Neurological:     Mental Status: She is alert and oriented to person, place, and time.  Psychiatric:        Mood and Affect: Mood normal.        Behavior: Behavior normal.    Today's Vitals   06/13/20 0843  BP: 122/82   There is no height or weight on file to calculate BMI.  Injection Note:Left arm serum contatins T-G-W, Exp:03/28/2021, 0.88ml given SQ to leftupper arm.Right arm serum contains M-DM-C-DG-CR-F,Exp: 03/28/2021,0.61ml given SQ to right upper arm. Tolerated procedure well. No adverse reaction noted. Pt refused to stay the thirty minutes to monitor. She has epi pen with her. Will notify if any problems     Assessment:     Allergy desensitization therapy      Plan:     1. Continue to monitor for s/s allergic reaction. Carry epi-pen at all times. Notify if any problems. RTC in 1 week.

## 2020-06-20 ENCOUNTER — Ambulatory Visit: Payer: PRIVATE HEALTH INSURANCE | Admitting: Family Medicine

## 2020-06-20 VITALS — BP 126/80

## 2020-06-20 DIAGNOSIS — Z516 Encounter for desensitization to allergens: Secondary | ICD-10-CM

## 2020-06-20 NOTE — Progress Notes (Signed)
  Subjective:     Patient ID: Susan Mason, female   DOB: [birth year], 59 y.o.   MRN: [id number]  HPI Susan Mason presents to the [facility] clinic today for her weekly allergy injections. She tolerated last weeks well without problem. She has had recent f/u with her allergist and will continue with her current regimen. She denies any problems or concerns today.  Review of Systems  Constitutional: Negative.   HENT: Negative.   Eyes: Negative.   Respiratory: Negative.        Objective:   Physical Exam Vitals reviewed.  Constitutional:      General: She is not in acute distress.    Appearance: Normal appearance. She is well-developed.  HENT:     Head: Normocephalic and atraumatic.  Eyes:     General:        Right eye: No discharge.        Left eye: No discharge.  Pulmonary:     Effort: Pulmonary effort is normal. No respiratory distress.  Skin:    General: Skin is warm and dry.  Neurological:     Mental Status: She is alert and oriented to person, place, and time.  Psychiatric:        Mood and Affect: Mood normal.        Behavior: Behavior normal.    Today's Vitals   06/20/20 0840  BP: 126/80   There is no height or weight on file to calculate BMI.  Injection Note:Left arm serum contatins T-G-W, Exp:03/28/2021, 0.81ml given SQ to leftupper arm.Right arm serum contains M-DM-C-DG-CR-F,Exp: 03/28/2021,0.68ml given SQ to right upper arm. Tolerated procedure well. No adverse reaction noted. Pt refused to stay the thirty minutes to monitor. She has epi pen with her. Will notify if any problems    Assessment:     Allergy desensitization therapy      Plan:     1. Continue to monitor for s/s allergic reaction. Carry epi-pen at all times. Notify if any problems. RTC in 1 week.

## 2020-07-03 ENCOUNTER — Ambulatory Visit
Admission: RE | Admit: 2020-07-03 | Discharge: 2020-07-03 | Disposition: A | Discharge status: Home / Self Care | Payer: PRIVATE HEALTH INSURANCE | Source: Ambulatory Visit | Attending: Internal Medicine | Admitting: Internal Medicine

## 2020-07-03 ENCOUNTER — Other Ambulatory Visit: Payer: Self-pay

## 2020-07-03 DIAGNOSIS — E2839 Other primary ovarian failure: Secondary | ICD-10-CM

## 2020-07-04 ENCOUNTER — Ambulatory Visit: Payer: PRIVATE HEALTH INSURANCE | Admitting: Family Medicine

## 2020-07-04 VITALS — BP 138/82

## 2020-07-04 DIAGNOSIS — Z516 Encounter for desensitization to allergens: Secondary | ICD-10-CM

## 2020-07-04 NOTE — Progress Notes (Signed)
  Subjective:     Patient ID: Susan Mason, female   DOB: Aug 24, 1960, 59 y.o.   MRN: [id number]  HPI Susan Mason presents to the [facility] clinic today for her weekly allergy injections. She tolerated last weeks well without problem. She has no problems or concerns today.   Review of Systems  Constitutional: Negative.   HENT: Negative.   Eyes: Negative.   Respiratory: Negative.   Cardiovascular: Negative.   Gastrointestinal: Negative.        Objective:   Physical Exam Vitals reviewed.  Constitutional:      General: She is not in acute distress.    Appearance: Normal appearance. She is well-developed.  HENT:     Head: Normocephalic and atraumatic.  Eyes:     General:        Right eye: No discharge.        Left eye: No discharge.  Pulmonary:     Effort: Pulmonary effort is normal. No respiratory distress.  Skin:    General: Skin is warm and dry.  Neurological:     Mental Status: She is alert and oriented to person, place, and time.  Psychiatric:        Mood and Affect: Mood normal.        Behavior: Behavior normal.   Injection Note:Left arm serum contatins T-G-W, Exp:03/28/2021, 0.24ml given SQ to leftupper arm.Right arm serum contains M-DM-C-DG-CR-F,Exp: 03/28/2021,0.66ml given SQ to right upper arm. Tolerated procedure well. No adverse reaction noted. Pt refused to stay the thirty minutes to monitor. She has epi pen with her. Will notify if any problems     Assessment:     Allergy desensitization therapy      Plan:     1. Continue to monitor for s/s allergic reaction. Carry epi-pen at all times. Notify if any problems. RTC in 1 week.

## 2020-07-11 ENCOUNTER — Ambulatory Visit: Payer: PRIVATE HEALTH INSURANCE | Admitting: Family Medicine

## 2020-07-11 ENCOUNTER — Encounter: Payer: Self-pay | Admitting: Family Medicine

## 2020-07-11 VITALS — BP 130/82

## 2020-07-11 DIAGNOSIS — Z516 Encounter for desensitization to allergens: Secondary | ICD-10-CM

## 2020-07-11 NOTE — Progress Notes (Signed)
  Subjective:     Patient ID: Susan Mason, female   DOB: 08/03/1961, 59 y.o.   MRN: [id number]  HPI Susan Mason presents to the [facility] clinic today for her weekly allergy injections. She is feeling well today with no concerns. She tolerated her injections last week well without any reaction.   Review of Systems  Constitutional: Negative.   HENT: Negative.   Eyes: Negative.   Respiratory: Negative.        Objective:   Physical Exam Vitals reviewed.  Constitutional:      General: She is not in acute distress.    Appearance: Normal appearance. She is well-developed.  HENT:     Head: Normocephalic and atraumatic.  Eyes:     General:        Right eye: No discharge.        Left eye: No discharge.  Pulmonary:     Effort: Pulmonary effort is normal. No respiratory distress.  Skin:    General: Skin is warm and dry.  Neurological:     Mental Status: She is alert and oriented to person, place, and time.  Psychiatric:        Mood and Affect: Mood normal.        Behavior: Behavior normal.   Injection Note:Left arm serum contatins T-G-W, Exp:03/28/2021, 0.76ml given SQ to leftupper arm.Right arm serum contains M-DM-C-DG-CR-F,Exp: 03/28/2021,0.35ml given SQ to right upper arm. Tolerated procedure well. No adverse reaction noted. Pt refused to stay the thirty minutes to monitor. She has epi pen with her. Will notify if any problems    Assessment:     Allergy desensitization therapy      Plan:     1. Continue to monitor for s/s allergic reaction. Carry epi-pen at all times. Notify if any problems. RTC in 1 week.

## 2020-07-18 ENCOUNTER — Ambulatory Visit: Payer: PRIVATE HEALTH INSURANCE | Admitting: Family Medicine

## 2020-07-18 VITALS — BP 118/84

## 2020-07-18 DIAGNOSIS — Z516 Encounter for desensitization to allergens: Secondary | ICD-10-CM

## 2020-07-18 NOTE — Progress Notes (Signed)
  Subjective:     Patient ID: Susan Mason, female   DOB: November 29, 1960, 59 y.o.   MRN: [id number]  HPI Susan Mason presents to the [facility] clinic today for her weekly allergy injections. She is feeling well today with no concerns. She tolerated her injections last week well without any reaction.   Review of Systems Constitutional: Negative.   HENT: Negative.   Eyes: Negative.   Respiratory: Negative    Objective:   Physical Exam Physical Exam Vitals reviewed.  Constitutional:      General: She is not in acute distress.    Appearance: Normal appearance. She is well-developed.  HENT:     Head: Normocephalic and atraumatic.  Eyes:     General:        Right eye: No discharge.        Left eye: No discharge.  Pulmonary:     Effort: Pulmonary effort is normal. No respiratory distress.  Skin:    General: Skin is warm and dry.  Neurological:     Mental Status: She is alert and oriented to person, place, and time.  Psychiatric:        Mood and Affect: Mood normal.        Behavior: Behavior normal.   Injection Note:Left arm serum contatins T-G-W, Exp:03/28/2021, 0.31ml given SQ to leftupper arm.Right arm serum contains M-DM-C-DG-CR-F,Exp: 03/28/2021,0.87ml given SQ to right upper arm. Tolerated procedure well. No adverse reaction noted. Pt refused to stay the thirty minutes to monitor. She has epi pen with her. Will notify if any problems    Assessment:     Allergy desensitization therapy      Plan:     1. Continue to monitor for s/s allergic reaction. Carry epi-pen at all times. Notify if any problems. RTC in 1 week.

## 2020-07-25 ENCOUNTER — Ambulatory Visit: Payer: PRIVATE HEALTH INSURANCE | Admitting: Family Medicine

## 2020-07-25 VITALS — BP 124/82

## 2020-07-25 DIAGNOSIS — Z516 Encounter for desensitization to allergens: Secondary | ICD-10-CM

## 2020-07-25 NOTE — Progress Notes (Signed)
Subjective   Patient ID: Susan Mason, female   DOB: Nov 08, 1960, 59 y.o.   MRN: [id number]   HPI Susan Mason presents to the [facility] clinic today for her weekly allergy injections. She is feeling well today with no concerns. She tolerated her injections last week well without any reaction.    Review of Systems Constitutional: Negative.   HENT: Negative.   Eyes: Negative.   Respiratory: Negative     Objective:    Objective   Physical Exam Physical Exam Vitals reviewed.  Constitutional:      General: She is not in acute distress.    Appearance: Normal appearance. She is well-developed.  HENT:     Head: Normocephalic and atraumatic.  Eyes:     General:        Right eye: No discharge.        Left eye: No discharge.  Pulmonary:     Effort: Pulmonary effort is normal. No respiratory distress.  Skin:    General: Skin is warm and dry.  Neurological:     Mental Status: She is alert and oriented to person, place, and time.  Psychiatric:        Mood and Affect: Mood normal.        Behavior: Behavior normal.    Injection Note: Left arm serum contatins T-G-W, Exp: 03/28/2021,  0.5 ml given SQ to left upper arm. Right arm serum contains M-DM-C-DG-CR-F, Exp: 03/28/2021, 0.5 ml given SQ to right upper arm.  Tolerated procedure well. No adverse reaction noted. Pt refused to stay the thirty minutes to monitor. She has epi pen with her. Will notify if any problems     Assessment:    Assessment    Allergy desensitization therapy         Plan:    Plan    1. Continue to monitor for s/s allergic reaction. Carry epi-pen at all times. Notify if any problems. RTC in 1 week.

## 2020-08-01 ENCOUNTER — Ambulatory Visit: Payer: PRIVATE HEALTH INSURANCE | Admitting: Family Medicine

## 2020-08-01 VITALS — BP 134/80

## 2020-08-01 DIAGNOSIS — Z516 Encounter for desensitization to allergens: Secondary | ICD-10-CM

## 2020-08-01 NOTE — Progress Notes (Signed)
Subjective   Patient ID: Susan Mason, female   DOB: Sep 19, 1960, 59 y.o.   MRN: [id number]   HPI Marlin presents to the [facility] clinic today for her weekly allergy injections. She is feeling well today with no concerns. She tolerated her injections last week well without any reaction.    Review of Systems Constitutional: Negative.   HENT: Negative.   Eyes: Negative.   Respiratory: Negative     Objective:    Physical Exam Vitals reviewed.  Constitutional:      General: She is not in acute distress.    Appearance: Normal appearance. She is well-developed.  HENT:     Head: Normocephalic and atraumatic.  Eyes:     General:        Right eye: No discharge.        Left eye: No discharge.  Pulmonary:     Effort: Pulmonary effort is normal. No respiratory distress.  Skin:    General: Skin is warm and dry.  Neurological:     Mental Status: She is alert and oriented to person, place, and time.  Psychiatric:        Mood and Affect: Mood normal.        Behavior: Behavior normal.    Injection Note: Left arm serum contatins T-G-W, Exp: 03/28/2021,  0.5 ml given SQ to left upper arm. Right arm serum contains M-DM-C-DG-CR-F, Exp: 03/28/2021, 0.5 ml given SQ to right upper arm.  Tolerated procedure well. No adverse reaction noted. Pt refused to stay the thirty minutes to monitor. She has epi pen with her. Will notify if any problems     Assessment:        Allergy desensitization therapy         Plan:        1. Continue to monitor for s/s allergic reaction. Carry epi-pen at all times. Notify if any problems. RTC in 1 week.

## 2020-08-15 ENCOUNTER — Ambulatory Visit: Payer: PRIVATE HEALTH INSURANCE | Admitting: Family Medicine

## 2020-08-15 VITALS — BP 128/82

## 2020-08-15 DIAGNOSIS — Z516 Encounter for desensitization to allergens: Secondary | ICD-10-CM

## 2020-08-15 NOTE — Progress Notes (Signed)
Subjective   Patient ID: Susan Mason, female   DOB: 02/13/1961, 60 y.o.   MRN: [id number]   HPI Sha presents to the [facility] clinic today for her weekly allergy injections. She is feeling well today with no concerns. She tolerated her last injections well without any reaction.    Review of Systems Constitutional: Negative.   HENT: Negative.   Eyes: Negative.   Respiratory: Negative     Objective:    Physical Exam Vitals reviewed.  Constitutional:      General: She is not in acute distress.    Appearance: Normal appearance. She is well-developed.  HENT:     Head: Normocephalic and atraumatic.  Eyes:     General:        Right eye: No discharge.        Left eye: No discharge.  Pulmonary:     Effort: Pulmonary effort is normal. No respiratory distress.  Skin:    General: Skin is warm and dry.  Neurological:     Mental Status: She is alert and oriented to person, place, and time.  Psychiatric:        Mood and Affect: Mood normal.        Behavior: Behavior normal.    Today's Vitals   08/15/20 0837  BP: 128/82   There is no height or weight on file to calculate BMI.  Injection Note: Left arm serum contatins T-G-W, Exp: 03/28/2021,  0.5 ml given SQ to left upper arm. Right arm serum contains M-DM-C-DG-CR-F, Exp: 03/28/2021, 0.5 ml given SQ to right upper arm.  Tolerated procedure well. No adverse reaction noted. Pt refused to stay the thirty minutes to monitor. She has epi pen with her. Will notify if any problems     Assessment:        Allergy desensitization therapy         Plan:        1. Continue to monitor for s/s allergic reaction. Carry epi-pen at all times. Notify if any problems. RTC in 1 week.

## 2020-08-22 ENCOUNTER — Ambulatory Visit: Payer: PRIVATE HEALTH INSURANCE | Admitting: Family Medicine

## 2020-08-22 VITALS — BP 130/82

## 2020-08-22 DIAGNOSIS — Z516 Encounter for desensitization to allergens: Secondary | ICD-10-CM

## 2020-08-22 NOTE — Progress Notes (Signed)
Subjective   Patient ID: Susan Mason, female   DOB: Mar 13, 1961, 60 y.o.   MRN: [id number]   HPI Susan Mason presents to the [facility] clinic today for her weekly allergy injections. She is feeling well today with no concerns. She tolerated her last injections well without any reaction.    Review of Systems Constitutional: Negative.   HENT: Negative.   Eyes: Negative.   Respiratory: Negative     Objective:    Physical Exam Vitals reviewed.  Constitutional:      General: She is not in acute distress.    Appearance: Normal appearance. She is well-developed.  HENT:     Head: Normocephalic and atraumatic.  Eyes:     General:        Right eye: No discharge.        Left eye: No discharge.  Pulmonary:     Effort: Pulmonary effort is normal. No respiratory distress.  Skin:    General: Skin is warm and dry.  Neurological:     Mental Status: She is alert and oriented to person, place, and time.  Psychiatric:        Mood and Affect: Mood normal.        Behavior: Behavior normal.    Today's Vitals   08/22/20 0839  BP: 130/82   There is no height or weight on file to calculate BMI.  Injection Note: Left arm serum contatins T-G-W, Exp: 03/28/2021,  0.5 ml given SQ to left upper arm. Right arm serum contains M-DM-C-DG-CR-F, Exp: 03/28/2021, 0.5 ml given SQ to right upper arm.  Tolerated procedure well. No adverse reaction noted. Pt refused to stay the thirty minutes to monitor. She has epi pen with her. Will notify if any problems     Assessment:        Allergy desensitization therapy         Plan:        1. Continue to monitor for s/s allergic reaction. Carry epi-pen at all times. Notify if any problems. RTC in 1 week.

## 2020-08-29 ENCOUNTER — Ambulatory Visit: Payer: PRIVATE HEALTH INSURANCE | Admitting: Family Medicine

## 2020-08-29 VITALS — BP 132/82

## 2020-08-29 DIAGNOSIS — Z516 Encounter for desensitization to allergens: Secondary | ICD-10-CM

## 2020-08-29 NOTE — Progress Notes (Signed)
Subjective   Patient ID: Susan Mason, female   DOB: May 11, 1961, 60 y.o.   MRN: [id number]   HPI Elisha presents to the [facility] clinic today for her weekly allergy injections. She is feeling well today with no concerns. She tolerated her last injections well without any reaction.    Review of Systems Constitutional: Negative.   HENT: Negative.   Eyes: Negative.   Respiratory: Negative     Objective:    Physical Exam Vitals reviewed.  Constitutional:      General: She is not in acute distress.    Appearance: Normal appearance. She is well-developed.  HENT:     Head: Normocephalic and atraumatic.  Eyes:     General:        Right eye: No discharge.        Left eye: No discharge.  Pulmonary:     Effort: Pulmonary effort is normal. No respiratory distress.  Skin:    General: Skin is warm and dry.  Neurological:     Mental Status: She is alert and oriented to person, place, and time.  Psychiatric:        Mood and Affect: Mood normal.        Behavior: Behavior normal.    Today's Vitals   08/29/20 0836  BP: 132/82   There is no height or weight on file to calculate BMI.  Injection Note: Left arm serum contatins T-G-W, Exp: 03/28/2021,  0.5 ml given SQ to left upper arm. Right arm serum contains M-DM-C-DG-CR-F, Exp: 03/28/2021, 0.5 ml given SQ to right upper arm.  Tolerated procedure well. No adverse reaction noted. Pt refused to stay the thirty minutes to monitor. She has epi pen with her. Will notify if any problems     Assessment:        Allergy desensitization therapy         Plan:        1. Continue to monitor for s/s allergic reaction. Carry epi-pen at all times. Notify if any problems. RTC in 1 week.    2. Discussed shingles vaccination. Recommended she get this, she will continue discussion with her PCP before making a decision.

## 2020-09-05 ENCOUNTER — Ambulatory Visit: Payer: PRIVATE HEALTH INSURANCE | Admitting: Family Medicine

## 2020-09-05 VITALS — BP 120/82

## 2020-09-05 DIAGNOSIS — Z516 Encounter for desensitization to allergens: Secondary | ICD-10-CM

## 2020-09-05 NOTE — Progress Notes (Signed)
Subjective   Patient ID: Susan Mason, female   DOB: Jan 01, 1961, 60 y.o.   MRN: [id number]   HPI Susan Mason presents to the [facility] clinic today for her weekly allergy injections. She is feeling well today with no concerns. She tolerated her last injections well without any reaction.    Review of Systems Constitutional: Negative.   HENT: Negative.   Eyes: Negative.   Respiratory: Negative     Objective:    Physical Exam Vitals reviewed.  Constitutional:      General: She is not in acute distress.    Appearance: Normal appearance. She is well-developed.  HENT:     Head: Normocephalic and atraumatic.  Eyes:     General:        Right eye: No discharge.        Left eye: No discharge.  Pulmonary:     Effort: Pulmonary effort is normal. No respiratory distress.  Skin:    General: Skin is warm and dry.  Neurological:     Mental Status: She is alert and oriented to person, place, and time.  Psychiatric:        Mood and Affect: Mood normal.        Behavior: Behavior normal.    Today's Vitals   09/05/20 0830  BP: 120/82   There is no height or weight on file to calculate BMI.  Injection Note: Left arm serum contatins T-G-W, Exp: 03/28/2021,  0.5 ml given SQ to left upper arm. Right arm serum contains M-DM-C-DG-CR-F, Exp: 03/28/2021, 0.5 ml given SQ to right upper arm.  Tolerated procedure well. No adverse reaction noted. Pt refused to stay the thirty minutes to monitor. She has epi pen with her. Will notify if any problems     Assessment:        Allergy desensitization therapy         Plan:        1. Continue to monitor for s/s allergic reaction. Carry epi-pen at all times. Notify if any problems. RTC in 1 week.

## 2020-09-11 ENCOUNTER — Other Ambulatory Visit: Payer: Self-pay

## 2020-09-11 ENCOUNTER — Ambulatory Visit: Payer: PRIVATE HEALTH INSURANCE | Admitting: Internal Medicine

## 2020-09-11 ENCOUNTER — Encounter: Payer: Self-pay | Admitting: Internal Medicine

## 2020-09-11 VITALS — BP 114/62 | HR 72 | Temp 98.2°F | Ht 60.6 in | Wt 101.0 lb

## 2020-09-11 DIAGNOSIS — J3089 Other allergic rhinitis: Secondary | ICD-10-CM | POA: Diagnosis not present

## 2020-09-11 DIAGNOSIS — I10 Essential (primary) hypertension: Secondary | ICD-10-CM

## 2020-09-11 DIAGNOSIS — Z566 Other physical and mental strain related to work: Secondary | ICD-10-CM | POA: Diagnosis not present

## 2020-09-11 DIAGNOSIS — Z79899 Other long term (current) drug therapy: Secondary | ICD-10-CM | POA: Diagnosis not present

## 2020-09-11 NOTE — Progress Notes (Signed)
I,Katawbba Wiggins,acting as a Education administrator for Maximino Greenland, MD.,have documented all relevant documentation on the behalf of Maximino Greenland, MD,as directed by  Maximino Greenland, MD while in the presence of Maximino Greenland, MD.  This visit occurred during the SARS-CoV-2 public health emergency.  Safety protocols were in place, including screening questions prior to the visit, additional usage of staff PPE, and extensive cleaning of exam room while observing appropriate contact time as indicated for disinfecting solutions.  Subjective:     Patient ID: Susan Mason , female    DOB: 07-08-1961 , 60 y.o.   MRN: [id number]   Chief Complaint  Patient presents with  . Hypertension    HPI  The patient is here today for a blood pressure follow-up.  She reports compliance with meds. She denies headaches, chest pain and shortness of breath.   Hypertension This is a chronic problem. The current episode started more than 1 year ago. The problem is unchanged. The problem is controlled. Pertinent negatives include no anxiety or malaise/fatigue. Past treatments include ACE inhibitors. There are no compliance problems.  There is no history of CAD/MI.     Past Medical History:  Diagnosis Date  . Abnormal Pap smear 2008   ASCUS  . High blood pressure   . Metrorrhagia   . Osteopenia   . Ovarian cyst   . Yeast vaginitis      Family History  Problem Relation Age of Onset  . Hyperlipidemia Mother   . Hypertension Mother   . Heart Problems Mother   . Kidney disease Mother   . Lung cancer Father   . Emphysema Father   . Breast cancer Neg Hx      Current Outpatient Medications:  .  cetirizine (ZYRTEC) 10 MG tablet, Take 10 mg by mouth daily. , Disp: , Rfl:  .  cholecalciferol (VITAMIN D) 1000 UNITS tablet, Take 1,000 Units by mouth daily., Disp: , Rfl:  .  fexofenadine-pseudoephedrine (ALLEGRA-D 24) 180-240 MG 24 hr tablet, Take 1 tablet by mouth daily., Disp: , Rfl:  .  lisinopril (ZESTRIL)  5 MG tablet, Take 1 tablet (5 mg total) by mouth daily., Disp: 90 tablet, Rfl: 2 .  Multiple Vitamins-Minerals (MULTIVITAMIN WITH MINERALS) tablet, Take 1 tablet by mouth daily., Disp: , Rfl:  .  Probiotic Product (PROBIOTIC-10 PO), Take 1 tablet by mouth daily., Disp: , Rfl:    Allergies  Allergen Reactions  . Penicillins   . Doxycycline   . Erythromycin   . Erythromycin Base      Review of Systems  Constitutional: Negative.  Negative for malaise/fatigue.  Respiratory: Negative.   Cardiovascular: Negative.   Gastrointestinal: Negative.   Psychiatric/Behavioral: Negative.   All other systems reviewed and are negative.    Today's Vitals   09/11/20 1535  BP: 114/62  Pulse: 72  Temp: 98.2 F (36.8 C)  TempSrc: Oral  Weight: 101 lb (45.8 kg)  Height: 5' 0.6" (1.539 m)   Body mass index is 19.34 kg/m.   Wt Readings from Last 3 Encounters:  09/11/20 101 lb (45.8 kg)  03/06/20 102 lb 3.2 oz (46.4 kg)  07/17/19 105 lb (47.6 kg)    Objective:  Physical Exam Vitals and nursing note reviewed.  Constitutional:      Appearance: Normal appearance.  HENT:     Head: Normocephalic and atraumatic.  Cardiovascular:     Rate and Rhythm: Normal rate and regular rhythm.     Heart sounds: Normal heart sounds.  Pulmonary:     Effort: Pulmonary effort is normal.     Breath sounds: Normal breath sounds.  Skin:    General: Skin is warm.  Neurological:     General: No focal deficit present.     Mental Status: She is alert and oriented to person, place, and time.         Assessment And Plan:     1. Essential hypertension Comments: Chronic, well controlled. She is encouraged to limit her salt intake. I will check renal function today.She prefers to have labwork drawn at her job, it is free. She will rto in six months for a full physical examination.  - CMP14+EGFR; Future  2. Work-related stress Comments: She is encouraged to start using her PTO to take days off. She is also  advised that Magnesium and L-theanine supplementation may help with her symptoms of anxiety.   3. Other allergic rhinitis Comments: Chronic. She will c/w weekly immunotherapy.   4. Drug therapy   I personally spent 25 minutes face-to-face and non-face-to-face in the care of this patient, which includes all pre-, intra-, and post visit time on the date of service.  Patient was given opportunity to ask questions. Patient verbalized understanding of the plan and was able to repeat key elements of the plan. All questions were answered to their satisfaction.  Maximino Greenland, MD   I, Maximino Greenland, MD, have reviewed all documentation for this visit. The documentation on 09/11/20 for the exam, diagnosis, procedures, and orders are all accurate and complete.  THE PATIENT IS ENCOURAGED TO PRACTICE SOCIAL DISTANCING DUE TO THE COVID-19 PANDEMIC.

## 2020-09-11 NOTE — Patient Instructions (Signed)

## 2020-09-12 ENCOUNTER — Other Ambulatory Visit: Payer: Self-pay | Admitting: Family Medicine

## 2020-09-12 ENCOUNTER — Ambulatory Visit: Payer: PRIVATE HEALTH INSURANCE | Admitting: Family Medicine

## 2020-09-12 DIAGNOSIS — Z516 Encounter for desensitization to allergens: Secondary | ICD-10-CM

## 2020-09-12 LAB — COMPLETE METABOLIC PANEL WITH GFR
AG Ratio: 1.9 (calc) (ref 1.0–2.5)
ALT: 9 U/L (ref 6–29)
AST: 15 U/L (ref 10–35)
Albumin: 4.4 g/dL (ref 3.6–5.1)
Alkaline phosphatase (APISO): 56 U/L (ref 37–153)
BUN: 21 mg/dL (ref 7–25)
CO2: 28 mmol/L (ref 20–32)
Calcium: 9.5 mg/dL (ref 8.6–10.4)
Chloride: 105 mmol/L (ref 98–110)
Creat: 0.85 mg/dL (ref 0.50–0.99)
GFR, Est African American: 86 mL/min/{1.73_m2} (ref >=60)
GFR, Est Non African American: 74 mL/min/{1.73_m2} (ref >=60)
Globulin: 2.3 g/dL (calc) (ref 1.9–3.7)
Glucose, Bld: 97 mg/dL (ref 65–139)
Potassium: 4.3 mmol/L (ref 3.5–5.3)
Sodium: 140 mmol/L (ref 135–146)
Total Bilirubin: 0.3 mg/dL (ref 0.2–1.2)
Total Protein: 6.7 g/dL (ref 6.1–8.1)

## 2020-09-12 NOTE — Progress Notes (Signed)
Subjective   Patient ID: Susan Mason, female   DOB: Mar 03, 1961, 60 y.o.   MRN: [id number]   HPI [name] presents to the [facility] clinic today for her weekly allergy injections. She is feeling well today with no concerns. She tolerated her last injections well without any reaction.    Review of Systems Constitutional: Negative.   HENT: Negative.   Eyes: Negative.   Respiratory: Negative     Objective:    Physical Exam Vitals reviewed.  Constitutional:      General: She is not in acute distress.    Appearance: Normal appearance. She is well-developed.  HENT:     Head: Normocephalic and atraumatic.  Eyes:     General:        Right eye: No discharge.        Left eye: No discharge.  Pulmonary:     Effort: Pulmonary effort is normal. No respiratory distress.  Skin:    General: Skin is warm and dry.  Neurological:     Mental Status: She is alert and oriented to person, place, and time.  Psychiatric:        Mood and Affect: Mood normal.        Behavior: Behavior normal.       Injection Note: Left arm serum contatins T-G-W, Exp: 03/28/2021,  0.5 ml given SQ to left upper arm. Right arm serum contains M-DM-C-DG-CR-F, Exp: 03/28/2021, 0.5 ml given SQ to right upper arm.  Tolerated procedure well. No adverse reaction noted. Pt refused to stay the thirty minutes to monitor. She has epi pen with her. Will notify if any problems     Assessment:        Allergy desensitization therapy         Plan:      1. Continue to monitor for s/s allergic reaction. Carry epi-pen at all times. Notify if any problems. RTC in 1 week.  2. Pt had her 6 month check up with her PCP yesterday and she would like to check her kidney and liver function. Her BP was well controlled at that visit and she will continue her current medication regimen. CMP drawn today, will notify pt of results when available.

## 2020-09-17 DIAGNOSIS — J3089 Other allergic rhinitis: Secondary | ICD-10-CM | POA: Insufficient documentation

## 2020-09-19 ENCOUNTER — Ambulatory Visit: Payer: PRIVATE HEALTH INSURANCE | Admitting: Family Medicine

## 2020-09-19 VITALS — BP 120/78

## 2020-09-19 DIAGNOSIS — Z516 Encounter for desensitization to allergens: Secondary | ICD-10-CM

## 2020-09-19 NOTE — Progress Notes (Signed)
Subjective   Patient ID: Susan Mason, female   DOB: Jan 01, 1961, 60 y.o.   MRN: [id number]   HPI Rondia presents to the [facility] clinic today for her weekly allergy injections. She is feeling well today with no concerns. She tolerated her last injections well without any reaction.    Review of Systems Constitutional: Negative.   HENT: Negative.   Eyes: Negative.   Respiratory: Negative     Objective:    Physical Exam Vitals reviewed.  Constitutional:      General: She is not in acute distress.    Appearance: Normal appearance. She is well-developed.  HENT:     Head: Normocephalic and atraumatic.  Eyes:     General:        Right eye: No discharge.        Left eye: No discharge.  Pulmonary:     Effort: Pulmonary effort is normal. No respiratory distress.  Skin:    General: Skin is warm and dry.  Neurological:     Mental Status: She is alert and oriented to person, place, and time.  Psychiatric:        Mood and Affect: Mood normal.        Behavior: Behavior normal.     Today's Vitals   09/19/20 0836  BP: 120/78   There is no height or weight on file to calculate BMI.   Injection Note: Left arm serum contatins T-G-W, Exp: 03/28/2021,  0.5 ml given SQ to left upper arm. Right arm serum contains M-DM-C-DG-CR-F, Exp: 03/28/2021, 0.5 ml given SQ to right upper arm.  Tolerated procedure well. No adverse reaction noted. Pt refused to stay the thirty minutes to monitor. She has epi pen with her. Will notify if any problems     Assessment:        Allergy desensitization therapy         Plan:      1. Continue to monitor for s/s allergic reaction. Carry epi-pen at all times. Notify if any problems. RTC in 1 week.  2.Reviewed lab results with pt. PCP has access to these results as well.

## 2020-09-26 ENCOUNTER — Ambulatory Visit: Payer: PRIVATE HEALTH INSURANCE | Admitting: Family Medicine

## 2020-09-26 VITALS — BP 116/82

## 2020-09-26 DIAGNOSIS — Z516 Encounter for desensitization to allergens: Secondary | ICD-10-CM

## 2020-09-26 NOTE — Progress Notes (Signed)
Subjective   Patient ID: Susan Mason, female   DOB: [birth year], 61 y.o.   MRN: [id number]   HPI Susan Mason presents to the [facility] clinic today for her weekly allergy injections. She is feeling well today with no concerns. She tolerated her last injections well without any reaction.    Review of Systems Constitutional: Negative.   HENT: Negative.   Eyes: Negative.   Respiratory: Negative     Objective:    Physical Exam Vitals reviewed.  Constitutional:      General: She is not in acute distress.    Appearance: Normal appearance. She is well-developed.  HENT:     Head: Normocephalic and atraumatic.  Eyes:     General:        Right eye: No discharge.        Left eye: No discharge.  Pulmonary:     Effort: Pulmonary effort is normal. No respiratory distress.  Skin:    General: Skin is warm and dry.  Neurological:     Mental Status: She is alert and oriented to person, place, and time.  Psychiatric:        Mood and Affect: Mood normal.        Behavior: Behavior normal.     Today's Vitals   09/26/20 0836  BP: 116/82   There is no height or weight on file to calculate BMI.   Injection Note: Left arm serum contatins T-G-W, new vial today, Exp: 09/03/2021,  0.1 ml given SQ to left upper arm. Right arm serum contains M-DM-C-DG-CR-F, Exp: 03/28/2021, 0.5 ml given SQ to right upper arm.  Tolerated procedure well. No adverse reaction noted. Pt refused to stay the thirty minutes to monitor. She has epi pen with her. Will notify if any problems     Assessment:        Allergy desensitization therapy         Plan:      1. Continue to monitor for s/s allergic reaction. Carry epi-pen at all times. Notify if any problems. RTC in 1 week.

## 2020-10-03 ENCOUNTER — Ambulatory Visit: Payer: PRIVATE HEALTH INSURANCE | Admitting: Family Medicine

## 2020-10-03 VITALS — BP 138/82

## 2020-10-03 DIAGNOSIS — Z516 Encounter for desensitization to allergens: Secondary | ICD-10-CM

## 2020-10-03 NOTE — Progress Notes (Signed)
Subjective   Patient ID: [name], female   DOB: 21-Jun-1961, 60 y.o.   MRN: [id number]   HPI Susan Mason presents to the [facility] clinic today for her weekly allergy injections. She is feeling well today with no concerns. She tolerated her last injections well without any reaction.    Review of Systems Constitutional: Negative.   HENT: Negative.   Eyes: Negative.   Respiratory: Negative     Objective:    Physical Exam Vitals reviewed.  Constitutional:      General: She is not in acute distress.    Appearance: Normal appearance. She is well-developed.  HENT:     Head: Normocephalic and atraumatic.  Eyes:     General:        Right eye: No discharge.        Left eye: No discharge.  Pulmonary:     Effort: Pulmonary effort is normal. No respiratory distress.  Skin:    General: Skin is warm and dry.  Neurological:     Mental Status: She is alert and oriented to person, place, and time.  Psychiatric:        Mood and Affect: Mood normal.        Behavior: Behavior normal.     Today's Vitals   10/03/20 0840  BP: 138/82   There is no height or weight on file to calculate BMI.   Injection Note: Left arm serum contatins T-G-W, Exp: 09/03/2021, 0.2 ml given SQ to left upper arm. Right arm serum contains M-DM-C-DG-CR-F, Exp: 03/28/2021, 0.5 ml given SQ to right upper arm.  Tolerated procedure well. No adverse reaction noted. Pt refused to stay the thirty minutes to monitor. She has epi pen with her. Will notify if any problems     Assessment:        Allergy desensitization therapy         Plan:      1. Continue to monitor for s/s allergic reaction. Carry epi-pen at all times. Notify if any problems. RTC in 1 week.

## 2020-10-08 ENCOUNTER — Telehealth: Payer: Self-pay

## 2020-10-08 NOTE — Telephone Encounter (Signed)
The pt called and said her GI doctor told her that she needed to see Dr. Lafe Garin and endocrinologist because she's concerned with the pt's thyroid level.  The pt wanted to know does she need to see the endocrinologist because Dr Allyne Gee has been handling her thyroid.  The pt verified that it had been at least 15 years since she last saw Dr. Talmage Nap for her thyroid.  The pt was told that Dr Allyne Gee said the pt's level fluctuates, that's normal for her and that the level can be rechecked at her next visit.  The pt will have lab orders faxed to her job for a lab draw like she did for her previous visits because the labs are free.

## 2020-10-10 ENCOUNTER — Ambulatory Visit: Payer: PRIVATE HEALTH INSURANCE | Admitting: Family Medicine

## 2020-10-10 VITALS — BP 126/84

## 2020-10-10 DIAGNOSIS — Z516 Encounter for desensitization to allergens: Secondary | ICD-10-CM

## 2020-10-10 NOTE — Progress Notes (Signed)
Subjective   Patient ID: Susan Mason, female   DOB: 1961/07/26, 60 y.o.   MRN: [id number]   HPI Susan Mason presents to the [facility] clinic today for her weekly allergy injections. She is feeling well today with no concerns. She tolerated her last injections well without any reaction.    Review of Systems Constitutional: Negative.   HENT: Negative.   Eyes: Negative.   Respiratory: Negative     Objective:    Physical Exam Vitals reviewed.  Constitutional:      General: She is not in acute distress.    Appearance: Normal appearance. She is well-developed.  HENT:     Head: Normocephalic and atraumatic.  Eyes:     General:        Right eye: No discharge.        Left eye: No discharge.  Pulmonary:     Effort: Pulmonary effort is normal. No respiratory distress.  Skin:    General: Skin is warm and dry.  Neurological:     Mental Status: She is alert and oriented to person, place, and time.  Psychiatric:        Mood and Affect: Mood normal.        Behavior: Behavior normal.     Today's Vitals   10/10/20 0859  BP: 126/84   There is no height or weight on file to calculate BMI.   Injection Note: Left arm serum contatins T-G-W, Exp: 09/03/2021, 0.3 ml given SQ to left upper arm. Right arm serum contains M-DM-C-DG-CR-F, Exp: 03/28/2021, only 0.3 ml left in the vial given SQ to right upper arm.  Tolerated procedure well. No adverse reaction noted. Pt refused to stay the thirty minutes to monitor. She has epi pen with her. Will notify if any problems     Assessment:        Allergy desensitization therapy         Plan:      1. Continue to monitor for s/s allergic reaction. Carry epi-pen at all times. Notify if any problems. RTC in 1 week.

## 2020-10-17 ENCOUNTER — Ambulatory Visit: Payer: PRIVATE HEALTH INSURANCE | Admitting: Family Medicine

## 2020-10-17 VITALS — BP 126/74

## 2020-10-17 DIAGNOSIS — Z516 Encounter for desensitization to allergens: Secondary | ICD-10-CM

## 2020-10-17 NOTE — Progress Notes (Signed)
Subjective   Patient ID: Susan Mason, female   DOB: 24-Aug-1960, 60 y.o.   MRN: [id number]   HPI Gilma presents to the [facility] clinic today for her weekly allergy injections. She is feeling well today with no concerns. She tolerated her last injections well without any reaction.    Review of Systems Constitutional: Negative.   HENT: Negative.   Eyes: Negative.   Respiratory: Negative     Objective:    Physical Exam Vitals reviewed.  Constitutional:      General: She is not in acute distress.    Appearance: Normal appearance. She is well-developed.  HENT:     Head: Normocephalic and atraumatic.  Eyes:     General:        Right eye: No discharge.        Left eye: No discharge.  Pulmonary:     Effort: Pulmonary effort is normal. No respiratory distress.  Skin:    General: Skin is warm and dry.  Neurological:     Mental Status: She is alert and oriented to person, place, and time.  Psychiatric:        Mood and Affect: Mood normal.        Behavior: Behavior normal.     Today's Vitals   10/17/20 0835  BP: 126/74   There is no height or weight on file to calculate BMI.   Injection Note: Left arm serum contatins T-G-W, Exp: 09/03/2021, 0.4 ml given SQ to left upper arm. Right arm serum contains M-DM-C-DG-CR-F, Exp: 09/03/2020, new vial today, 0.1 ml given SQ to right upper arm.  Tolerated procedure well. No adverse reaction noted. Pt refused to stay the thirty minutes to monitor. She has epi pen with her. Will notify if any problems     Assessment:        Allergy desensitization therapy         Plan:      1. Continue to monitor for s/s allergic reaction. Carry epi-pen at all times. Notify if any problems. RTC in 1 week.

## 2020-10-24 ENCOUNTER — Ambulatory Visit: Payer: PRIVATE HEALTH INSURANCE | Admitting: Family Medicine

## 2020-10-24 VITALS — BP 124/80

## 2020-10-24 DIAGNOSIS — Z516 Encounter for desensitization to allergens: Secondary | ICD-10-CM

## 2020-10-24 NOTE — Progress Notes (Signed)
Subjective   Patient ID: Susan Mason, female   DOB: 05-25-1961, 60 y.o.   MRN: [id number]   HPI Vaniyah presents to the [facility] clinic today for her weekly allergy injections. She is feeling well today with no concerns. She tolerated her last injections well without any reaction.    Review of Systems Constitutional: Negative.   HENT: Negative.   Eyes: Negative.   Respiratory: Negative     Objective:    Physical Exam Vitals reviewed.  Constitutional:      General: She is not in acute distress.    Appearance: Normal appearance. She is well-developed.  HENT:     Head: Normocephalic and atraumatic.  Eyes:     General:        Right eye: No discharge.        Left eye: No discharge.  Pulmonary:     Effort: Pulmonary effort is normal. No respiratory distress.  Skin:    General: Skin is warm and dry.  Neurological:     Mental Status: She is alert and oriented to person, place, and time.  Psychiatric:        Mood and Affect: Mood normal.        Behavior: Behavior normal.     Today's Vitals   10/24/20 0849  BP: 124/80   There is no height or weight on file to calculate BMI.   Injection Note: Left arm serum contatins T-G-W, Exp: 09/03/2021, 0.5 ml given SQ to left upper arm. Right arm serum contains M-DM-C-DG-CR-F, Exp: 09/03/2020, new vial today, 0.2 ml given SQ to right upper arm.  Tolerated procedure well. No adverse reaction noted. Pt refused to stay the thirty minutes to monitor. She has epi pen with her. Will notify if any problems     Assessment:        Allergy desensitization therapy         Plan:      1. Continue to monitor for s/s allergic reaction. Carry epi-pen at all times. Notify if any problems. RTC in 1 week.

## 2020-10-25 ENCOUNTER — Ambulatory Visit (HOSPITAL_COMMUNITY)
Admission: EM | Admit: 2020-10-25 | Discharge: 2020-10-25 | Disposition: A | Discharge status: Home / Self Care | Payer: PRIVATE HEALTH INSURANCE

## 2020-10-25 ENCOUNTER — Encounter (HOSPITAL_COMMUNITY): Payer: Self-pay | Admitting: Emergency Medicine

## 2020-10-25 ENCOUNTER — Emergency Department (HOSPITAL_COMMUNITY)
Admission: EM | Admit: 2020-10-25 | Discharge: 2020-10-25 | Disposition: A | Discharge status: Home / Self Care | Payer: PRIVATE HEALTH INSURANCE | Attending: Emergency Medicine | Admitting: Emergency Medicine

## 2020-10-25 ENCOUNTER — Other Ambulatory Visit: Payer: Self-pay

## 2020-10-25 DIAGNOSIS — Z79899 Other long term (current) drug therapy: Secondary | ICD-10-CM | POA: Insufficient documentation

## 2020-10-25 DIAGNOSIS — K118 Other diseases of salivary glands: Secondary | ICD-10-CM | POA: Insufficient documentation

## 2020-10-25 DIAGNOSIS — I10 Essential (primary) hypertension: Secondary | ICD-10-CM | POA: Diagnosis not present

## 2020-10-25 DIAGNOSIS — K1120 Sialoadenitis, unspecified: Secondary | ICD-10-CM

## 2020-10-25 DIAGNOSIS — R221 Localized swelling, mass and lump, neck: Secondary | ICD-10-CM | POA: Diagnosis present

## 2020-10-25 LAB — COMPREHENSIVE METABOLIC PANEL
ALT: 14 U/L (ref 0–44)
AST: 22 U/L (ref 15–41)
Albumin: 4.2 g/dL (ref 3.5–5.0)
Alkaline Phosphatase: 45 U/L (ref 38–126)
Anion gap: 5 (ref 5–15)
BUN: 24 mg/dL — ABNORMAL HIGH (ref 6–20)
CO2: 28 mmol/L (ref 22–32)
Calcium: 9.2 mg/dL (ref 8.9–10.3)
Chloride: 104 mmol/L (ref 98–111)
Creatinine, Ser: 1.38 mg/dL — ABNORMAL HIGH (ref 0.44–1.00)
GFR, Estimated: 44 mL/min — ABNORMAL LOW (ref >60)
Glucose, Bld: 97 mg/dL (ref 70–99)
Potassium: 3.9 mmol/L (ref 3.5–5.1)
Sodium: 137 mmol/L (ref 135–145)
Total Bilirubin: 0.7 mg/dL (ref 0.3–1.2)
Total Protein: 6.6 g/dL (ref 6.5–8.1)

## 2020-10-25 LAB — CBC WITH DIFFERENTIAL/PLATELET
Abs Immature Granulocytes: 0.02 10*3/uL (ref 0.00–0.07)
Basophils Absolute: 0 10*3/uL (ref 0.0–0.1)
Basophils Relative: 1 %
Eosinophils Absolute: 0.1 10*3/uL (ref 0.0–0.5)
Eosinophils Relative: 1 %
HCT: 36.7 % (ref 36.0–46.0)
Hemoglobin: 12.5 g/dL (ref 12.0–15.0)
Immature Granulocytes: 0 %
Lymphocytes Relative: 29 %
Lymphs Abs: 1.8 10*3/uL (ref 0.7–4.0)
MCH: 30.4 pg (ref 26.0–34.0)
MCHC: 34.1 g/dL (ref 30.0–36.0)
MCV: 89.3 fL (ref 80.0–100.0)
Monocytes Absolute: 0.4 10*3/uL (ref 0.1–1.0)
Monocytes Relative: 7 %
Neutro Abs: 3.8 10*3/uL (ref 1.7–7.7)
Neutrophils Relative %: 62 %
Platelets: 257 10*3/uL (ref 150–400)
RBC: 4.11 MIL/uL (ref 3.87–5.11)
RDW: 12.7 % (ref 11.5–15.5)
WBC: 6.1 10*3/uL (ref 4.0–10.5)
nRBC: 0 % (ref 0.0–0.2)

## 2020-10-25 NOTE — ED Provider Notes (Signed)
[facility] EMERGENCY DEPARTMENT Provider Note   CSN: [id number] Arrival date & time: 10/25/20  1925     History Chief Complaint  Patient presents with  . Sore Throat    Susan Mason is a 60 y.o. female.  HPI     60 year old female comes in a chief complaint of neck swelling. Patient reports that on 5 she was having supper when she suddenly noted swelling to the left side of her throat.  The swelling lasted about 30 to 45 minutes.  It resolved on its own.  A couple of hours later she was about to have TEA, and the swelling came back again.  While coming to the ER, the swelling resolved again.  She did not have significant pain but did have some soreness in the neck and it felt like she had some trouble swallowing.  She had no difficulty breathing.  No known history of allergy or allergic reaction that are severe.  She denies any trauma to the back of her throat.  No history of salivary gland stone.  She denies any recent URI-like symptoms.  Past Medical History:  Diagnosis Date  . Abnormal Pap smear 2008   ASCUS  . High blood pressure   . Metrorrhagia   . Osteopenia   . Ovarian cyst   . Yeast vaginitis     Patient Active Problem List   Diagnosis Date Noted  . Other allergic rhinitis 09/17/2020  . Essential hypertension 06/16/2018  . Osteopenia 04/01/2012    Past Surgical History:  Procedure Laterality Date  . COLPOSCOPY  2008   neg bx     OB History    Gravida  0   Para      Term      Preterm      AB      Living        SAB      IAB      Ectopic      Multiple      Live Births              Family History  Problem Relation Age of Onset  . Hyperlipidemia Mother   . Hypertension Mother   . Heart Problems Mother   . Kidney disease Mother   . Lung cancer Father   . Emphysema Father   . Breast cancer Neg Hx     Social History   Tobacco Use  . Smoking status: Never Smoker  . Smokeless tobacco: Never Used  Vaping  Use  . Vaping Use: Never used  Substance Use Topics  . Alcohol use: No  . Drug use: No    Home Medications Prior to Admission medications   Medication Sig Start Date End Date Taking? Authorizing Provider  cetirizine (ZYRTEC) 10 MG tablet Take 10 mg by mouth daily.     [provider]  cholecalciferol (VITAMIN D) 1000 UNITS tablet Take 1,000 Units by mouth daily.    [provider]  fexofenadine-pseudoephedrine (ALLEGRA-D 24) 180-240 MG 24 hr tablet Take 1 tablet by mouth daily.    [provider]  lisinopril (ZESTRIL) 5 MG tablet Take 1 tablet (5 mg total) by mouth daily. 03/06/20   Dorothyann Peng, MD  Multiple Vitamins-Minerals (MULTIVITAMIN WITH MINERALS) tablet Take 1 tablet by mouth daily.    [provider]  Probiotic Product (PROBIOTIC-10 PO) Take 1 tablet by mouth daily.    [provider]    Allergies    Penicillins,  Doxycycline, Erythromycin, and Erythromycin base  Review of Systems   Review of Systems  Constitutional: Positive for activity change.  HENT: Positive for trouble swallowing. Negative for sore throat.     Physical Exam Updated Vital Signs BP 136/80   Pulse 65   Temp 98.4 F (36.9 C) (Oral)   Resp 16   SpO2 98%   Physical Exam Vitals and nursing note reviewed.  HENT:     Head: Atraumatic.     Comments: Prominent submandibular gland, without tenderness.  Oral exam did not reveal any evidence of PTA, tonsillitis. Cardiovascular:     Rate and Rhythm: Normal rate.  Pulmonary:     Effort: Pulmonary effort is normal.  Abdominal:     General: Bowel sounds are normal.  Musculoskeletal:     Cervical back: Normal range of motion and neck supple.  Skin:    General: Skin is warm and dry.  Neurological:     Mental Status: She is alert and oriented to person, place, and time.     ED Results / Procedures / Treatments   Labs (all labs ordered are listed, but only abnormal results are displayed) Labs Reviewed   COMPREHENSIVE METABOLIC PANEL - Abnormal; Notable for the following components:      Result Value   BUN 24 (*)    Creatinine, Ser 1.38 (*)    GFR, Estimated 44 (*)    All other components within normal limits  CBC WITH DIFFERENTIAL/PLATELET    EKG None  Radiology No results found.  Procedures Procedures   Medications Ordered in ED Medications - No data to display  ED Course  I have reviewed the triage vital signs and the nursing notes.  Pertinent labs & imaging results that were available during my care of the patient were reviewed by me and considered in my medical decision making (see chart for details).    MDM Rules/Calculators/A&P                          60 year old female comes in a chief complaint of intermittent neck swelling.  I am assuming that she might have had Stensen's duct obstruction that was intermittent on the left side.  Currently she does have prominent bilateral salivary gland swelling but there is symmetry, and it is nontender and mobile.  I have informed patient that I suspect she might have had intermittent parotid duct obstruction -and we would recommend that she follow-up with her PCP in 1 week if she has persistent swelling.  We also informed her to see her PCP for the prominent submandibular gland that she has -so that PCP can monitor them and order advanced imaging or ENT follow-up if needed.  Final Clinical Impression(s) / ED Diagnoses Final diagnoses:  Salivary gland inflammation    Rx / DC Orders ED Discharge Orders    None       Derwood Kaplan, MD 10/25/20 2146

## 2020-10-25 NOTE — ED Triage Notes (Signed)
Patient with sore throat on the left side.  She states that her glands on the left were swollen and she was having a hard time swallowing.  She states that it went away, came back again.  It has resolved again.  No pain in throat at this time.  Patient does have seasonal allergies.

## 2020-10-25 NOTE — ED Notes (Addendum)
Pt approached this RN stating "I'm just going to check myself out and will follow up with my primary doctor." This RN told the pt to have a seat back in her room in that I would inform the EDP. Rhunette Croft, MD has been notified.

## 2020-10-31 ENCOUNTER — Ambulatory Visit: Payer: PRIVATE HEALTH INSURANCE | Admitting: Family Medicine

## 2020-10-31 VITALS — BP 118/70

## 2020-10-31 DIAGNOSIS — Z516 Encounter for desensitization to allergens: Secondary | ICD-10-CM

## 2020-10-31 NOTE — Progress Notes (Signed)
Subjective   Patient ID: Susan Mason, female   DOB: 07-08-1961, 60 y.o.   MRN: [id number]   HPI Susan Mason presents to the [facility] clinic today for her weekly allergy injections. She is feeling well today with no concerns. She tolerated her last injections well without any reaction.    Review of Systems Constitutional: Negative.   HENT: Negative.   Eyes: Negative.   Respiratory: Negative     Objective:    Physical Exam Vitals reviewed.  Constitutional:      General: She is not in acute distress.    Appearance: Normal appearance. She is well-developed.  HENT:     Head: Normocephalic and atraumatic.  Eyes:     General:        Right eye: No discharge.        Left eye: No discharge.  Pulmonary:     Effort: Pulmonary effort is normal. No respiratory distress.  Skin:    General: Skin is warm and dry.  Neurological:     Mental Status: She is alert and oriented to person, place, and time.  Psychiatric:        Mood and Affect: Mood normal.        Behavior: Behavior normal.     Today's Vitals   10/31/20 0840  BP: 118/70   There is no height or weight on file to calculate BMI.   Injection Note: Left arm serum contatins T-G-W, Exp: 09/03/2021, 0.5 ml given SQ to left upper arm. Right arm serum contains M-DM-C-DG-CR-F, Exp: 09/03/2021, 0.3 ml given SQ to right upper arm.  Tolerated procedure well. No adverse reaction noted. Pt refused to stay the thirty minutes to monitor. She has epi pen with her. Will notify if any problems     Assessment:        Allergy desensitization therapy         Plan:      1. Continue to monitor for s/s allergic reaction. Carry epi-pen at all times. Notify if any problems. RTC in 1 week.

## 2020-11-07 ENCOUNTER — Ambulatory Visit: Payer: PRIVATE HEALTH INSURANCE | Admitting: Family Medicine

## 2020-11-07 VITALS — BP 110/78

## 2020-11-07 DIAGNOSIS — Z516 Encounter for desensitization to allergens: Secondary | ICD-10-CM

## 2020-11-07 NOTE — Progress Notes (Signed)
Subjective   Patient ID: Susan Mason, female   DOB: [birth year], 60 y.o.   MRN: [id number]   HPI Susan Mason presents to the [facility] clinic today for her weekly allergy injections. She is feeling well today with no concerns. She tolerated her last injections well without any reaction.    Review of Systems Constitutional: Negative.   HENT: Negative.   Eyes: Negative.   Respiratory: Negative     Objective:    Physical Exam Vitals reviewed.  Constitutional:      General: She is not in acute distress.    Appearance: Normal appearance. She is well-developed.  HENT:     Head: Normocephalic and atraumatic.  Eyes:     General:        Right eye: No discharge.        Left eye: No discharge.  Pulmonary:     Effort: Pulmonary effort is normal. No respiratory distress.  Skin:    General: Skin is warm and dry.  Neurological:     Mental Status: She is alert and oriented to person, place, and time.  Psychiatric:        Mood and Affect: Mood normal.        Behavior: Behavior normal.     Today's Vitals   11/07/20 0829  BP: 110/78   There is no height or weight on file to calculate BMI.   Injection Note: Left arm serum contatins T-G-W, Exp: 09/03/2021, 0.5 ml given SQ to left upper arm. Right arm serum contains M-DM-C-DG-CR-F, Exp: 09/03/2021, 0.4 ml given SQ to right upper arm.  Tolerated procedure well. No adverse reaction noted. Pt refused to stay the thirty minutes to monitor. She has epi pen with her. Will notify if any problems     Assessment:        Allergy desensitization therapy         Plan:      1. Continue to monitor for s/s allergic reaction. Carry epi-pen at all times. Notify if any problems. RTC in 1 week.

## 2020-11-14 ENCOUNTER — Ambulatory Visit: Payer: PRIVATE HEALTH INSURANCE | Admitting: Family Medicine

## 2020-11-14 VITALS — BP 122/76

## 2020-11-14 DIAGNOSIS — Z516 Encounter for desensitization to allergens: Secondary | ICD-10-CM

## 2020-11-14 NOTE — Progress Notes (Signed)
Subjective   Patient ID: Susan Mason, female   DOB: March 01, 1961, 60 y.o.   MRN: [id number]   HPI Susan Mason presents to the [facility] clinic today for her weekly allergy injections. She is feeling well today with no concerns. She tolerated her last injections well without any reaction.    Review of Systems Constitutional: Negative.   HENT: Negative.   Eyes: Negative.   Respiratory: Negative     Objective:    Physical Exam Vitals reviewed.  Constitutional:      General: She is not in acute distress.    Appearance: Normal appearance. She is well-developed.  HENT:     Head: Normocephalic and atraumatic.  Eyes:     General:        Right eye: No discharge.        Left eye: No discharge.  Pulmonary:     Effort: Pulmonary effort is normal. No respiratory distress.  Skin:    General: Skin is warm and dry.  Neurological:     Mental Status: She is alert and oriented to person, place, and time.  Psychiatric:        Mood and Affect: Mood normal.        Behavior: Behavior normal.     Today's Vitals   11/14/20 0847  BP: 122/76   There is no height or weight on file to calculate BMI.   Injection Note: Left arm serum contatins T-G-W, Exp: 09/03/2021, 0.5 ml given SQ to left upper arm. Right arm serum contains M-DM-C-DG-CR-F, Exp: 09/03/2021, 0.5 ml given SQ to right upper arm.  Tolerated procedure well. No adverse reaction noted. Pt refused to stay the thirty minutes to monitor. She has epi pen with her. Will notify if any problems     Assessment:        Allergy desensitization therapy         Plan:      1. Continue to monitor for s/s allergic reaction. Carry epi-pen at all times. Notify if any problems. RTC in 1 week.

## 2020-11-21 ENCOUNTER — Ambulatory Visit: Payer: PRIVATE HEALTH INSURANCE

## 2020-11-28 ENCOUNTER — Ambulatory Visit: Payer: PRIVATE HEALTH INSURANCE | Admitting: Family Medicine

## 2020-11-28 VITALS — BP 130/76

## 2020-11-28 DIAGNOSIS — Z516 Encounter for desensitization to allergens: Secondary | ICD-10-CM

## 2020-11-28 NOTE — Progress Notes (Signed)
Subjective   Patient ID: Susan Mason, female   DOB: [birth year], 60 y.o.   MRN: [id number]   HPI Quintara presents to the [facility] clinic today for her weekly allergy injections. She is feeling well today with no concerns. She tolerated her last injections well without any reaction.    Review of Systems Constitutional: Negative.   HENT: Negative.   Eyes: Negative.   Respiratory: Negative     Objective:    Physical Exam Vitals reviewed.  Constitutional:      General: She is not in acute distress.    Appearance: Normal appearance. She is well-developed.  HENT:     Head: Normocephalic and atraumatic.  Eyes:     General:        Right eye: No discharge.        Left eye: No discharge.  Pulmonary:     Effort: Pulmonary effort is normal. No respiratory distress.  Skin:    General: Skin is warm and dry.  Neurological:     Mental Status: She is alert and oriented to person, place, and time.  Psychiatric:        Mood and Affect: Mood normal.        Behavior: Behavior normal.     Today's Vitals   11/28/20 0849  BP: 130/76   There is no height or weight on file to calculate BMI.   Injection Note: Left arm serum contatins T-G-W, Exp: 09/03/2021, 0.5 ml given SQ to left upper arm. Right arm serum contains M-DM-C-DG-CR-F, Exp: 09/03/2021, 0.5 ml given SQ to right upper arm.  Tolerated procedure well. No adverse reaction noted. Pt refused to stay the thirty minutes to monitor. She has epi pen with her. Will notify if any problems     Assessment:        Allergy desensitization therapy         Plan:      1. Continue to monitor for s/s allergic reaction. Carry epi-pen at all times. Notify if any problems. RTC in 1 week.

## 2020-12-26 ENCOUNTER — Ambulatory Visit: Payer: PRIVATE HEALTH INSURANCE | Admitting: Family

## 2020-12-26 ENCOUNTER — Encounter: Payer: Self-pay | Admitting: Family

## 2020-12-26 VITALS — BP 120/74

## 2020-12-26 DIAGNOSIS — Z516 Encounter for desensitization to allergens: Secondary | ICD-10-CM | POA: Insufficient documentation

## 2020-12-26 NOTE — Progress Notes (Signed)
Established Patient Office Visit  Subjective:  Patient ID: Susan Mason, female    DOB: 08/25/1960  Age: 60 y.o. MRN: [id number]  CC: No chief complaint on file.   HPI Susan Mason presents for weekly allergy injection.  She reports feeling well, with no concerns.  Denies a reaction from last weeks injection.   Past Medical History:  Diagnosis Date  . Abnormal Pap smear 2008   ASCUS  . High blood pressure   . Metrorrhagia   . Osteopenia   . Ovarian cyst   . Yeast vaginitis     Past Surgical History:  Procedure Laterality Date  . COLPOSCOPY  2008   neg bx    Family History  Problem Relation Age of Onset  . Hyperlipidemia Mother   . Hypertension Mother   . Heart Problems Mother   . Kidney disease Mother   . Lung cancer Father   . Emphysema Father   . Breast cancer Neg Hx     Social History   Socioeconomic History  . Marital status: Married    Spouse name: Not on file  . Number of children: Not on file  . Years of education: Not on file  . Highest education level: Not on file  Occupational History  . Not on file  Tobacco Use  . Smoking status: Never Smoker  . Smokeless tobacco: Never Used  Vaping Use  . Vaping Use: Never used  Substance and Sexual Activity  . Alcohol use: No  . Drug use: No  . Sexual activity: Not on file  Other Topics Concern  . Not on file  Social History Narrative  . Not on file   Social Determinants of Health   Financial Resource Strain: Not on file  Food Insecurity: Not on file  Transportation Needs: Not on file  Physical Activity: Not on file  Stress: Not on file  Social Connections: Not on file  Intimate Partner Violence: Not on file    Outpatient Medications Prior to Visit  Medication Sig Dispense Refill  . cetirizine (ZYRTEC) 10 MG tablet Take 10 mg by mouth daily.     . cholecalciferol (VITAMIN D) 1000 UNITS tablet Take 1,000 Units by mouth daily.    . fexofenadine-pseudoephedrine (ALLEGRA-D 24) 180-240 MG  24 hr tablet Take 1 tablet by mouth daily.    Marland Kitchen lisinopril (ZESTRIL) 5 MG tablet Take 1 tablet (5 mg total) by mouth daily. 90 tablet 2  . Multiple Vitamins-Minerals (MULTIVITAMIN WITH MINERALS) tablet Take 1 tablet by mouth daily.    . Probiotic Product (PROBIOTIC-10 PO) Take 1 tablet by mouth daily.     No facility-administered medications prior to visit.    Allergies  Allergen Reactions  . Penicillins   . Doxycycline   . Erythromycin   . Erythromycin Base     ROS Review of Systems  Constitutional: Negative.   HENT: Negative.   Eyes: Negative.   Respiratory: Negative.   Cardiovascular: Negative.   Gastrointestinal: Negative.   Endocrine: Negative.   Genitourinary: Negative.   Musculoskeletal: Negative.   Skin: Negative.   Allergic/Immunologic: Positive for environmental allergies.  Neurological: Negative.   Hematological: Negative.   Psychiatric/Behavioral: Negative.       Objective:    Physical Exam Constitutional:      Appearance: Normal appearance.  Eyes:     Pupils: Pupils are equal, round, and reactive to light.  Cardiovascular:     Rate and Rhythm: Normal rate and regular rhythm.  Pulses: Normal pulses.     Heart sounds: Normal heart sounds.  Neurological:     Mental Status: She is alert.  Psychiatric:        Mood and Affect: Mood normal.        Behavior: Behavior normal.     BP 120/74  Wt Readings from Last 3 Encounters:  09/11/20 101 lb (45.8 kg)  03/06/20 102 lb 3.2 oz (46.4 kg)  07/17/19 105 lb (47.6 kg)     Health Maintenance Due  Topic Date Due  . Zoster Vaccines- Shingrix (1 of 2) Never done    There are no preventive care reminders to display for this patient.  Lab Results  Component Value Date   TSH 0.40 06/13/2020   Lab Results  Component Value Date   WBC 6.1 10/25/2020   HGB 12.5 10/25/2020   HCT 36.7 10/25/2020   MCV 89.3 10/25/2020   PLT 257 10/25/2020   Lab Results  Component Value Date   NA 137 10/25/2020    K 3.9 10/25/2020   CO2 28 10/25/2020   GLUCOSE 97 10/25/2020   BUN 24 (H) 10/25/2020   CREATININE 1.38 (H) 10/25/2020   BILITOT 0.7 10/25/2020   ALKPHOS 45 10/25/2020   AST 22 10/25/2020   ALT 14 10/25/2020   PROT 6.6 10/25/2020   ALBUMIN 4.2 10/25/2020   CALCIUM 9.2 10/25/2020   ANIONGAP 5 10/25/2020   Lab Results  Component Value Date   CHOL 187 02/22/2020   Lab Results  Component Value Date   HDL 65 02/22/2020   Lab Results  Component Value Date   LDLCALC 108 (H) 02/22/2020   Lab Results  Component Value Date   TRIG 48 02/22/2020   Lab Results  Component Value Date   CHOLHDL 2.9 02/22/2020   Lab Results  Component Value Date   HGBA1C 5.2 02/22/2020      Assessment & Plan:   Problem List Items Addressed This Visit      Other   Allergy desensitization therapy      Continue to monitor for a reaction the allergy serum.  Keep Epipen with your self at all times.    Follow-up: RTC in 1 week for next injection   Liliane Channel, NP

## 2021-01-02 ENCOUNTER — Ambulatory Visit: Payer: PRIVATE HEALTH INSURANCE | Admitting: Family

## 2021-01-02 ENCOUNTER — Encounter: Payer: Self-pay | Admitting: Family

## 2021-01-02 VITALS — BP 120/60

## 2021-01-02 NOTE — Progress Notes (Signed)
Established Patient Office Visit  Subjective:  Patient ID: Susan Mason, female    DOB: Feb 11, 1961  Age: 60 y.o. MRN: [id number]  CC: No chief complaint on file.   HPI Susan Mason presents for weekly allergy injection.  She reports feeling well, with no concerns.  Denies a reaction from last weeks injection.   Past Medical History:  Diagnosis Date  . Abnormal Pap smear 2008   ASCUS  . High blood pressure   . Metrorrhagia   . Osteopenia   . Ovarian cyst   . Yeast vaginitis     Past Surgical History:  Procedure Laterality Date  . COLPOSCOPY  2008   neg bx    Family History  Problem Relation Age of Onset  . Hyperlipidemia Mother   . Hypertension Mother   . Heart Problems Mother   . Kidney disease Mother   . Lung cancer Father   . Emphysema Father   . Breast cancer Neg Hx     Social History   Socioeconomic History  . Marital status: Married    Spouse name: Not on file  . Number of children: Not on file  . Years of education: Not on file  . Highest education level: Not on file  Occupational History  . Not on file  Tobacco Use  . Smoking status: Never Smoker  . Smokeless tobacco: Never Used  Vaping Use  . Vaping Use: Never used  Substance and Sexual Activity  . Alcohol use: No  . Drug use: No  . Sexual activity: Not on file  Other Topics Concern  . Not on file  Social History Narrative  . Not on file   Social Determinants of Health   Financial Resource Strain: Not on file  Food Insecurity: Not on file  Transportation Needs: Not on file  Physical Activity: Not on file  Stress: Not on file  Social Connections: Not on file  Intimate Partner Violence: Not on file    Outpatient Medications Prior to Visit  Medication Sig Dispense Refill  . cetirizine (ZYRTEC) 10 MG tablet Take 10 mg by mouth daily.     . cholecalciferol (VITAMIN D) 1000 UNITS tablet Take 1,000 Units by mouth daily.    . fexofenadine-pseudoephedrine (ALLEGRA-D 24) 180-240 MG  24 hr tablet Take 1 tablet by mouth daily.    Marland Kitchen lisinopril (ZESTRIL) 5 MG tablet Take 1 tablet (5 mg total) by mouth daily. 90 tablet 2  . Multiple Vitamins-Minerals (MULTIVITAMIN WITH MINERALS) tablet Take 1 tablet by mouth daily.    . Probiotic Product (PROBIOTIC-10 PO) Take 1 tablet by mouth daily.     No facility-administered medications prior to visit.    Allergies  Allergen Reactions  . Penicillins   . Doxycycline   . Erythromycin   . Erythromycin Base     ROS Review of Systems  Constitutional: Negative.   HENT: Negative.   Eyes: Negative.   Respiratory: Negative.   Cardiovascular: Negative.   Gastrointestinal: Negative.   Endocrine: Negative.   Genitourinary: Negative.   Musculoskeletal: Negative.   Skin: Negative.   Allergic/Immunologic: Positive for environmental allergies.  Neurological: Negative.   Hematological: Negative.   Psychiatric/Behavioral: Negative.       Objective:    Physical Exam Constitutional:      Appearance: Normal appearance.  Eyes:     Pupils: Pupils are equal, round, and reactive to light.  Cardiovascular:     Rate and Rhythm: Normal rate and regular rhythm.  Pulses: Normal pulses.     Heart sounds: Normal heart sounds.  Neurological:     Mental Status: She is alert.  Psychiatric:        Mood and Affect: Mood normal.        Behavior: Behavior normal.     BP 120/60 (BP Location: Right Arm)  Wt Readings from Last 3 Encounters:  09/11/20 101 lb (45.8 kg)  03/06/20 102 lb 3.2 oz (46.4 kg)  07/17/19 105 lb (47.6 kg)     Health Maintenance Due  Topic Date Due  . Zoster Vaccines- Shingrix (1 of 2) Never done    There are no preventive care reminders to display for this patient.  Lab Results  Component Value Date   TSH 0.40 06/13/2020   Lab Results  Component Value Date   WBC 6.1 10/25/2020   HGB 12.5 10/25/2020   HCT 36.7 10/25/2020   MCV 89.3 10/25/2020   PLT 257 10/25/2020   Lab Results  Component Value Date    NA 137 10/25/2020   K 3.9 10/25/2020   CO2 28 10/25/2020   GLUCOSE 97 10/25/2020   BUN 24 (H) 10/25/2020   CREATININE 1.38 (H) 10/25/2020   BILITOT 0.7 10/25/2020   ALKPHOS 45 10/25/2020   AST 22 10/25/2020   ALT 14 10/25/2020   PROT 6.6 10/25/2020   ALBUMIN 4.2 10/25/2020   CALCIUM 9.2 10/25/2020   ANIONGAP 5 10/25/2020   Lab Results  Component Value Date   CHOL 187 02/22/2020   Lab Results  Component Value Date   HDL 65 02/22/2020   Lab Results  Component Value Date   LDLCALC 108 (H) 02/22/2020   Lab Results  Component Value Date   TRIG 48 02/22/2020   Lab Results  Component Value Date   CHOLHDL 2.9 02/22/2020   Lab Results  Component Value Date   HGBA1C 5.2 02/22/2020      Assessment & Plan:   Problem List Items Addressed This Visit   None   Visit Diagnoses    Asthma in adult, moderate persistent, uncomplicated    -  Primary      Continue to monitor for a reaction the allergy serum.  Keep Epipen with your self at all times.    Follow-up: RTC in 1 week for next injection   Liliane Channel, NP

## 2021-01-09 ENCOUNTER — Ambulatory Visit: Payer: PRIVATE HEALTH INSURANCE | Admitting: Adult Health

## 2021-01-09 NOTE — Progress Notes (Signed)
[facility] [address], [place] [address]   Office Visit Note  Patient Name: Susan Mason  [id number]  [id number]  Date of Service: 01/09/2021  CC: Allergy injection    HPI Pt is here for weekly allergy injection. She denies any reaction to last weeks injections.  She has been on allergy shots for a few years.      Current Medication:  Outpatient Encounter Medications as of 01/09/2021  Medication Sig   cetirizine (ZYRTEC) 10 MG tablet Take 10 mg by mouth daily.    cholecalciferol (VITAMIN D) 1000 UNITS tablet Take 1,000 Units by mouth daily.   fexofenadine-pseudoephedrine (ALLEGRA-D 24) 180-240 MG 24 hr tablet Take 1 tablet by mouth daily.   lisinopril (ZESTRIL) 5 MG tablet Take 1 tablet (5 mg total) by mouth daily.   Multiple Vitamins-Minerals (MULTIVITAMIN WITH MINERALS) tablet Take 1 tablet by mouth daily.   Probiotic Product (PROBIOTIC-10 PO) Take 1 tablet by mouth daily.   No facility-administered encounter medications on file as of 01/09/2021.      Medical History: Past Medical History:  Diagnosis Date   Abnormal Pap smear 2008   ASCUS   High blood pressure    Metrorrhagia    Osteopenia    Ovarian cyst    Yeast vaginitis      Vital Signs: There were no vitals taken for this visit.   Review of Systems  All other systems reviewed and are negative.  Physical Exam Constitutional:      Appearance: Normal appearance.  Neurological:     Mental Status: She is alert.      Assessment/Plan: 1. Allergy desensitization therapy Allergy injections given in bilateral posterior arms. Patient tolerated well. Plan is to continue weekly injections as ordered.         General Counseling: Yulissa verbalizes understanding of the findings of todays visit and agrees with plan of treatment. I have discussed any further diagnostic evaluation that may be needed or ordered today. We also reviewed her medications today. she has been encouraged to call the  office with any questions or concerns that should arise related to todays visit.   No orders of the defined types were placed in this encounter.   No orders of the defined types were placed in this encounter.   Time spent: 10 Minutes    [name] AGNP-C Nurse Practitioner

## 2021-01-16 ENCOUNTER — Ambulatory Visit: Payer: PRIVATE HEALTH INSURANCE | Admitting: Family

## 2021-01-16 VITALS — BP 120/72

## 2021-01-16 NOTE — Progress Notes (Signed)
Subjective   Patient ID: Susan Mason, female   DOB: Aug 19, 1960, 60 y.o.   MRN: [id number]   HPI Tangy presents to the [facility] clinic today for her weekly allergy injections. She is feeling well today with no concerns. She tolerated her last injections well without any reaction.    Review of Systems Constitutional: Negative.   HENT: Negative.   Eyes: Negative.   Respiratory: Negative     Objective:    Physical Exam Vitals reviewed.  Constitutional:      General: She is not in acute distress.    Appearance: Normal appearance. She is well-developed.  HENT:     Head: Normocephalic and atraumatic.  Eyes:     General:        Right eye: No discharge.        Left eye: No discharge.  Pulmonary:     Effort: Pulmonary effort is normal. No respiratory distress.  Skin:    General: Skin is warm and dry.  Neurological:     Mental Status: She is alert and oriented to person, place, and time.  Psychiatric:        Mood and Affect: Mood normal.        Behavior: Behavior normal.     There were no vitals filed for this visit.  There is no height or weight on file to calculate BMI.   Injection Note: Left arm serum contatins T-G-W, Exp: 09/03/2021, 0.5 ml given SQ to left upper arm. Right arm serum contains M-DM-C-DG-CR-F, Exp: 09/03/2021, 0.5 ml given SQ to right upper arm.  Tolerated procedure well. No adverse reaction noted. Pt refused to stay the thirty minutes to monitor. She has epi pen with her. Will notify if any problems     Assessment:        Allergy desensitization therapy         Plan:      1. Continue to monitor for s/s allergic reaction. Carry epi-pen at all times. Notify if any problems. RTC in 1 week.

## 2021-01-23 ENCOUNTER — Encounter: Payer: Self-pay | Admitting: Family

## 2021-01-23 ENCOUNTER — Ambulatory Visit: Payer: PRIVATE HEALTH INSURANCE | Admitting: Family

## 2021-01-23 VITALS — BP 120/66

## 2021-01-23 DIAGNOSIS — Z516 Encounter for desensitization to allergens: Secondary | ICD-10-CM

## 2021-01-23 NOTE — Progress Notes (Signed)
Established Patient Office Visit  Subjective:  Patient ID: Susan Mason, female    DOB: November 04, 1960  Age: 60 y.o. MRN: [id number]  CC:  Chief Complaint  Patient presents with   allergy desensitization therapy    HPI Susan Mason presents for weekly allergy injection.  Past Medical History:  Diagnosis Date   Abnormal Pap smear 2008   ASCUS   High blood pressure    Metrorrhagia    Osteopenia    Ovarian cyst    Yeast vaginitis     Past Surgical History:  Procedure Laterality Date   COLPOSCOPY  2008   neg bx    Family History  Problem Relation Age of Onset   Hyperlipidemia Mother    Hypertension Mother    Heart Problems Mother    Kidney disease Mother    Lung cancer Father    Emphysema Father    Breast cancer Neg Hx     Social History   Socioeconomic History   Marital status: Married    Spouse name: Not on file   Number of children: Not on file   Years of education: Not on file   Highest education level: Not on file  Occupational History   Not on file  Tobacco Use   Smoking status: Never   Smokeless tobacco: Never  Vaping Use   Vaping Use: Never used  Substance and Sexual Activity   Alcohol use: No   Drug use: No   Sexual activity: Not on file  Other Topics Concern   Not on file  Social History Narrative   Not on file   Social Determinants of Health   Financial Resource Strain: Not on file  Food Insecurity: Not on file  Transportation Needs: Not on file  Physical Activity: Not on file  Stress: Not on file  Social Connections: Not on file  Intimate Partner Violence: Not on file    Outpatient Medications Prior to Visit  Medication Sig Dispense Refill   cetirizine (ZYRTEC) 10 MG tablet Take 10 mg by mouth daily.      cholecalciferol (VITAMIN D) 1000 UNITS tablet Take 1,000 Units by mouth daily.     fexofenadine-pseudoephedrine (ALLEGRA-D 24) 180-240 MG 24 hr tablet Take 1 tablet by mouth daily.     lisinopril (ZESTRIL) 5 MG tablet  Take 1 tablet (5 mg total) by mouth daily. 90 tablet 2   Multiple Vitamins-Minerals (MULTIVITAMIN WITH MINERALS) tablet Take 1 tablet by mouth daily.     Probiotic Product (PROBIOTIC-10 PO) Take 1 tablet by mouth daily.     No facility-administered medications prior to visit.    Allergies  Allergen Reactions   Penicillins    Doxycycline    Erythromycin    Erythromycin Base     ROS Review of Systems    Objective:    Physical Exam  BP 120/66 (BP Location: Right Arm)  Wt Readings from Last 3 Encounters:  09/11/20 101 lb (45.8 kg)  03/06/20 102 lb 3.2 oz (46.4 kg)  07/17/19 105 lb (47.6 kg)     Health Maintenance Due  Topic Date Due   Pneumococcal Vaccine 53-63 Years old (1 - PCV) Never done   Zoster Vaccines- Shingrix (1 of 2) Never done   COVID-19 Vaccine (4 - Booster for Moderna series) 09/17/2020    There are no preventive care reminders to display for this patient.  Lab Results  Component Value Date   TSH 0.40 06/13/2020   Lab Results  Component Value Date  WBC 6.1 10/25/2020   HGB 12.5 10/25/2020   HCT 36.7 10/25/2020   MCV 89.3 10/25/2020   PLT 257 10/25/2020   Lab Results  Component Value Date   NA 137 10/25/2020   K 3.9 10/25/2020   CO2 28 10/25/2020   GLUCOSE 97 10/25/2020   BUN 24 (H) 10/25/2020   CREATININE 1.38 (H) 10/25/2020   BILITOT 0.7 10/25/2020   ALKPHOS 45 10/25/2020   AST 22 10/25/2020   ALT 14 10/25/2020   PROT 6.6 10/25/2020   ALBUMIN 4.2 10/25/2020   CALCIUM 9.2 10/25/2020   ANIONGAP 5 10/25/2020   Lab Results  Component Value Date   CHOL 187 02/22/2020   Lab Results  Component Value Date   HDL 65 02/22/2020   Lab Results  Component Value Date   LDLCALC 108 (H) 02/22/2020   Lab Results  Component Value Date   TRIG 48 02/22/2020   Lab Results  Component Value Date   CHOLHDL 2.9 02/22/2020   Lab Results  Component Value Date   HGBA1C 5.2 02/22/2020      Assessment & Plan:   Continue allergy injections  as ordered.     Follow-up: RTC as needed.   Liliane Channel, NPSubjective   Patient ID: Susan Mason, female   DOB: [birth year], 60 y.o.   MRN: [id number]   HPI Susan Mason presents to the [facility] clinic today for her weekly allergy injections. She is feeling well today with no concerns. She tolerated her last injections well without any reaction.    Review of Systems Constitutional: Negative.   HENT: Negative.   Eyes: Negative.   Respiratory: Negative     Objective:    Physical Exam Vitals reviewed.  Constitutional:      General: She is not in acute distress.    Appearance: Normal appearance. She is well-developed.  HENT:     Head: Normocephalic and atraumatic.  Eyes:     General:        Right eye: No discharge.        Left eye: No discharge.  Pulmonary:     Effort: Pulmonary effort is normal. No respiratory distress.  Skin:    General: Skin is warm and dry.  Neurological:     Mental Status: She is alert and oriented to person, place, and time.  Psychiatric:        Mood and Affect: Mood normal.        Behavior: Behavior normal.     Today's Vitals   01/23/21 1011  BP: 120/66    There is no height or weight on file to calculate BMI.   Injection Note: Left arm serum contatins T-G-W, Exp: 09/03/2021, 0.5 ml given SQ to left upper arm. Right arm serum contains M-DM-C-DG-CR-F, Exp: 09/03/2021, 0.5 ml given SQ to right upper arm.  Tolerated procedure well. No adverse reaction noted. Pt refused to stay the thirty minutes to monitor. She has epi pen with her. Will notify if any problems     Assessment:        Allergy desensitization therapy         Plan:      1. Continue to monitor for s/s allergic reaction. Carry epi-pen at all times. Notify if any problems. RTC in 1 week.

## 2021-01-27 ENCOUNTER — Other Ambulatory Visit: Payer: Self-pay | Admitting: Internal Medicine

## 2021-01-27 DIAGNOSIS — Z1231 Encounter for screening mammogram for malignant neoplasm of breast: Secondary | ICD-10-CM

## 2021-01-30 ENCOUNTER — Ambulatory Visit: Payer: PRIVATE HEALTH INSURANCE | Admitting: Family

## 2021-01-30 ENCOUNTER — Encounter: Payer: Self-pay | Admitting: Family

## 2021-01-30 VITALS — BP 118/64

## 2021-01-30 DIAGNOSIS — Z516 Encounter for desensitization to allergens: Secondary | ICD-10-CM

## 2021-01-30 NOTE — Progress Notes (Signed)
Established Patient Office Visit  Subjective:  Patient ID: SHATERA RENNERT, female    DOB: [birth year]  Age: 60 y.o. MRN: [id number]  CC:  No chief complaint on file.   HPI Brayli A Suffy-Eng presents for weekly allergy injection.  Past Medical History:  Diagnosis Date   Abnormal Pap smear 2008   ASCUS   High blood pressure    Metrorrhagia    Osteopenia    Ovarian cyst    Yeast vaginitis     Past Surgical History:  Procedure Laterality Date   COLPOSCOPY  2008   neg bx    Family History  Problem Relation Age of Onset   Hyperlipidemia Mother    Hypertension Mother    Heart Problems Mother    Kidney disease Mother    Lung cancer Father    Emphysema Father    Breast cancer Neg Hx     Social History   Socioeconomic History   Marital status: Married    Spouse name: Not on file   Number of children: Not on file   Years of education: Not on file   Highest education level: Not on file  Occupational History   Not on file  Tobacco Use   Smoking status: Never   Smokeless tobacco: Never  Vaping Use   Vaping Use: Never used  Substance and Sexual Activity   Alcohol use: No   Drug use: No   Sexual activity: Not on file  Other Topics Concern   Not on file  Social History Narrative   Not on file   Social Determinants of Health   Financial Resource Strain: Not on file  Food Insecurity: Not on file  Transportation Needs: Not on file  Physical Activity: Not on file  Stress: Not on file  Social Connections: Not on file  Intimate Partner Violence: Not on file    Outpatient Medications Prior to Visit  Medication Sig Dispense Refill   cetirizine (ZYRTEC) 10 MG tablet Take 10 mg by mouth daily.      cholecalciferol (VITAMIN D) 1000 UNITS tablet Take 1,000 Units by mouth daily.     fexofenadine-pseudoephedrine (ALLEGRA-D 24) 180-240 MG 24 hr tablet Take 1 tablet by mouth daily.     lisinopril (ZESTRIL) 5 MG tablet Take 1 tablet (5 mg total) by mouth daily. 90  tablet 2   Multiple Vitamins-Minerals (MULTIVITAMIN WITH MINERALS) tablet Take 1 tablet by mouth daily.     Probiotic Product (PROBIOTIC-10 PO) Take 1 tablet by mouth daily.     No facility-administered medications prior to visit.    Allergies  Allergen Reactions   Penicillins    Doxycycline    Erythromycin    Erythromycin Base     ROS Review of Systems    Objective:    Physical Exam  There were no vitals taken for this visit. Wt Readings from Last 3 Encounters:  09/11/20 101 lb (45.8 kg)  03/06/20 102 lb 3.2 oz (46.4 kg)  07/17/19 105 lb (47.6 kg)     Health Maintenance Due  Topic Date Due   Pneumococcal Vaccine 51-52 Years old (1 - PCV) Never done   Zoster Vaccines- Shingrix (1 of 2) Never done   COVID-19 Vaccine (4 - Booster for Moderna series) 09/17/2020    There are no preventive care reminders to display for this patient.  Lab Results  Component Value Date   TSH 0.40 06/13/2020   Lab Results  Component Value Date   WBC 6.1 10/25/2020  HGB 12.5 10/25/2020   HCT 36.7 10/25/2020   MCV 89.3 10/25/2020   PLT 257 10/25/2020   Lab Results  Component Value Date   NA 137 10/25/2020   K 3.9 10/25/2020   CO2 28 10/25/2020   GLUCOSE 97 10/25/2020   BUN 24 (H) 10/25/2020   CREATININE 1.38 (H) 10/25/2020   BILITOT 0.7 10/25/2020   ALKPHOS 45 10/25/2020   AST 22 10/25/2020   ALT 14 10/25/2020   PROT 6.6 10/25/2020   ALBUMIN 4.2 10/25/2020   CALCIUM 9.2 10/25/2020   ANIONGAP 5 10/25/2020   Lab Results  Component Value Date   CHOL 187 02/22/2020   Lab Results  Component Value Date   HDL 65 02/22/2020   Lab Results  Component Value Date   LDLCALC 108 (H) 02/22/2020   Lab Results  Component Value Date   TRIG 48 02/22/2020   Lab Results  Component Value Date   CHOLHDL 2.9 02/22/2020   Lab Results  Component Value Date   HGBA1C 5.2 02/22/2020      Assessment & Plan:   Continue allergy injections as ordered.     Follow-up: RTC as  needed.   Liliane Channel, NPSubjective   Patient ID: Dub Mikes, female   DOB: 11-03-1960, 60 y.o.   MRN: [id number]   HPI Kathy presents to the [facility] clinic today for her weekly allergy injections. She is feeling well today with no concerns. She tolerated her last injections well without any reaction.    Review of Systems Constitutional: Negative.   HENT: Negative.   Eyes: Negative.   Respiratory: Negative     Objective:    Physical Exam Vitals reviewed.  Constitutional:      General: She is not in acute distress.    Appearance: Normal appearance. She is well-developed.  HENT:     Head: Normocephalic and atraumatic.  Eyes:     General:        Right eye: No discharge.        Left eye: No discharge.  Pulmonary:     Effort: Pulmonary effort is normal. No respiratory distress.  Skin:    General: Skin is warm and dry.  Neurological:     Mental Status: She is alert and oriented to person, place, and time.  Psychiatric:        Mood and Affect: Mood normal.        Behavior: Behavior normal.     There were no vitals filed for this visit.   There is no height or weight on file to calculate BMI.   Injection Note: Left arm serum contatins T-G-W, Exp: 09/03/2021, 0.5 ml given SQ to left upper arm. Right arm serum contains M-DM-C-DG-CR-F, Exp: 09/03/2021, 0.5 ml given SQ to right upper arm.  Tolerated procedure well. No adverse reaction noted. Pt refused to stay the thirty minutes to monitor. She has epi pen with her. Will notify if any problems     Assessment:        Allergy desensitization therapy         Plan:      1. Continue to monitor for s/s allergic reaction. Carry epi-pen at all times. Notify if any problems. RTC in 1 week.

## 2021-02-06 ENCOUNTER — Ambulatory Visit: Payer: PRIVATE HEALTH INSURANCE | Admitting: Adult Health

## 2021-02-06 ENCOUNTER — Other Ambulatory Visit: Payer: Self-pay

## 2021-02-06 DIAGNOSIS — Z516 Encounter for desensitization to allergens: Secondary | ICD-10-CM

## 2021-02-06 NOTE — Progress Notes (Signed)
Established Patient Office Visit  Subjective:  Patient ID: Susan Mason, female    DOB: September 10, 1960  Age: 60 y.o. MRN: [id number]  CC: Allergy shots   HPI Susan Mason presents for administration of allergy shots. She has been taking shots for many years.  She takes them weekly.   Past Medical History:  Diagnosis Date   Abnormal Pap smear 2008   ASCUS   High blood pressure    Metrorrhagia    Osteopenia    Ovarian cyst    Yeast vaginitis     Past Surgical History:  Procedure Laterality Date   COLPOSCOPY  2008   neg bx    Family History  Problem Relation Age of Onset   Hyperlipidemia Mother    Hypertension Mother    Heart Problems Mother    Kidney disease Mother    Lung cancer Father    Emphysema Father    Breast cancer Neg Hx     Social History   Socioeconomic History   Marital status: Married    Spouse name: Not on file   Number of children: Not on file   Years of education: Not on file   Highest education level: Not on file  Occupational History   Not on file  Tobacco Use   Smoking status: Never   Smokeless tobacco: Never  Vaping Use   Vaping Use: Never used  Substance and Sexual Activity   Alcohol use: No   Drug use: No   Sexual activity: Not on file  Other Topics Concern   Not on file  Social History Narrative   Not on file   Social Determinants of Health   Financial Resource Strain: Not on file  Food Insecurity: Not on file  Transportation Needs: Not on file  Physical Activity: Not on file  Stress: Not on file  Social Connections: Not on file  Intimate Partner Violence: Not on file    Outpatient Medications Prior to Visit  Medication Sig Dispense Refill   cetirizine (ZYRTEC) 10 MG tablet Take 10 mg by mouth daily.      cholecalciferol (VITAMIN D) 1000 UNITS tablet Take 1,000 Units by mouth daily.     fexofenadine-pseudoephedrine (ALLEGRA-D 24) 180-240 MG 24 hr tablet Take 1 tablet by mouth daily.     lisinopril (ZESTRIL) 5 MG  tablet Take 1 tablet (5 mg total) by mouth daily. 90 tablet 2   Multiple Vitamins-Minerals (MULTIVITAMIN WITH MINERALS) tablet Take 1 tablet by mouth daily.     Probiotic Product (PROBIOTIC-10 PO) Take 1 tablet by mouth daily.     No facility-administered medications prior to visit.    Allergies  Allergen Reactions   Penicillins    Doxycycline    Erythromycin    Erythromycin Base     ROS Review of Systems  Constitutional:  Negative for activity change and appetite change.     Objective:    Physical Exam Constitutional:      Appearance: Normal appearance. She is normal weight.  Neurological:     Mental Status: She is alert.    There were no vitals taken for this visit. Wt Readings from Last 3 Encounters:  09/11/20 101 lb (45.8 kg)  03/06/20 102 lb 3.2 oz (46.4 kg)  07/17/19 105 lb (47.6 kg)     Health Maintenance Due  Topic Date Due   Pneumococcal Vaccine 68-74 Years old (1 - PCV) Never done   Zoster Vaccines- Shingrix (1 of 2) Never done   COVID-19  Vaccine (4 - Booster for Moderna series) 09/17/2020    There are no preventive care reminders to display for this patient.  Lab Results  Component Value Date   TSH 0.40 06/13/2020   Lab Results  Component Value Date   WBC 6.1 10/25/2020   HGB 12.5 10/25/2020   HCT 36.7 10/25/2020   MCV 89.3 10/25/2020   PLT 257 10/25/2020   Lab Results  Component Value Date   NA 137 10/25/2020   K 3.9 10/25/2020   CO2 28 10/25/2020   GLUCOSE 97 10/25/2020   BUN 24 (H) 10/25/2020   CREATININE 1.38 (H) 10/25/2020   BILITOT 0.7 10/25/2020   ALKPHOS 45 10/25/2020   AST 22 10/25/2020   ALT 14 10/25/2020   PROT 6.6 10/25/2020   ALBUMIN 4.2 10/25/2020   CALCIUM 9.2 10/25/2020   ANIONGAP 5 10/25/2020   Lab Results  Component Value Date   CHOL 187 02/22/2020   Lab Results  Component Value Date   HDL 65 02/22/2020   Lab Results  Component Value Date   LDLCALC 108 (H) 02/22/2020   Lab Results  Component Value Date    TRIG 48 02/22/2020   Lab Results  Component Value Date   CHOLHDL 2.9 02/22/2020   Lab Results  Component Value Date   HGBA1C 5.2 02/22/2020      Assessment & Plan:  1. Allergy desensitization therapy Shots administered.  0.57ml of serum in each posterior upper extremity.  Continue with weekly injections per allergist.     Problem List Items Addressed This Visit   None   No orders of the defined types were placed in this encounter.   Follow-up: No follow-ups on file.    [name], NP

## 2021-02-20 ENCOUNTER — Ambulatory Visit: Payer: PRIVATE HEALTH INSURANCE | Admitting: Adult Health

## 2021-02-20 ENCOUNTER — Other Ambulatory Visit: Payer: Self-pay

## 2021-02-20 DIAGNOSIS — Z516 Encounter for desensitization to allergens: Secondary | ICD-10-CM

## 2021-02-20 NOTE — Progress Notes (Signed)
Established Patient Office Visit  Subjective:  Patient ID: Susan Mason, female    DOB: [birth year]  Age: 60 y.o. MRN: [id number]  CC: Need for Immunotherapy Injections   HPI Amylia A Suffy-Eng presents requesting administration of her allergy injections. Denies any issues or reactions with weekly injections.   Past Medical History:  Diagnosis Date   Abnormal Pap smear 2008   ASCUS   High blood pressure    Metrorrhagia    Osteopenia    Ovarian cyst    Yeast vaginitis     Past Surgical History:  Procedure Laterality Date   COLPOSCOPY  2008   neg bx    Family History  Problem Relation Age of Onset   Hyperlipidemia Mother    Hypertension Mother    Heart Problems Mother    Kidney disease Mother    Lung cancer Father    Emphysema Father    Breast cancer Neg Hx     Social History   Socioeconomic History   Marital status: Married    Spouse name: Not on file   Number of children: Not on file   Years of education: Not on file   Highest education level: Not on file  Occupational History   Not on file  Tobacco Use   Smoking status: Never   Smokeless tobacco: Never  Vaping Use   Vaping Use: Never used  Substance and Sexual Activity   Alcohol use: No   Drug use: No   Sexual activity: Not on file  Other Topics Concern   Not on file  Social History Narrative   Not on file   Social Determinants of Health   Financial Resource Strain: Not on file  Food Insecurity: Not on file  Transportation Needs: Not on file  Physical Activity: Not on file  Stress: Not on file  Social Connections: Not on file  Intimate Partner Violence: Not on file    Outpatient Medications Prior to Visit  Medication Sig Dispense Refill   cetirizine (ZYRTEC) 10 MG tablet Take 10 mg by mouth daily.      cholecalciferol (VITAMIN D) 1000 UNITS tablet Take 1,000 Units by mouth daily.     fexofenadine-pseudoephedrine (ALLEGRA-D 24) 180-240 MG 24 hr tablet Take 1 tablet by mouth daily.      lisinopril (ZESTRIL) 5 MG tablet Take 1 tablet (5 mg total) by mouth daily. 90 tablet 2   Multiple Vitamins-Minerals (MULTIVITAMIN WITH MINERALS) tablet Take 1 tablet by mouth daily.     Probiotic Product (PROBIOTIC-10 PO) Take 1 tablet by mouth daily.     No facility-administered medications prior to visit.    Allergies  Allergen Reactions   Penicillins    Doxycycline    Erythromycin    Erythromycin Base     ROS Review of Systems Negative   Objective:    Physical Exam Well appearing, NAD noted.    There were no vitals taken for this visit. Wt Readings from Last 3 Encounters:  09/11/20 101 lb (45.8 kg)  03/06/20 102 lb 3.2 oz (46.4 kg)  07/17/19 105 lb (47.6 kg)     Health Maintenance Due  Topic Date Due   Pneumococcal Vaccine 96-103 Years old (1 - PCV) Never done   Zoster Vaccines- Shingrix (1 of 2) Never done   COVID-19 Vaccine (4 - Booster for Moderna series) 09/17/2020    There are no preventive care reminders to display for this patient.  Lab Results  Component Value Date   TSH 0.40  06/13/2020   Lab Results  Component Value Date   WBC 6.1 10/25/2020   HGB 12.5 10/25/2020   HCT 36.7 10/25/2020   MCV 89.3 10/25/2020   PLT 257 10/25/2020   Lab Results  Component Value Date   NA 137 10/25/2020   K 3.9 10/25/2020   CO2 28 10/25/2020   GLUCOSE 97 10/25/2020   BUN 24 (H) 10/25/2020   CREATININE 1.38 (H) 10/25/2020   BILITOT 0.7 10/25/2020   ALKPHOS 45 10/25/2020   AST 22 10/25/2020   ALT 14 10/25/2020   PROT 6.6 10/25/2020   ALBUMIN 4.2 10/25/2020   CALCIUM 9.2 10/25/2020   ANIONGAP 5 10/25/2020   Lab Results  Component Value Date   CHOL 187 02/22/2020   Lab Results  Component Value Date   HDL 65 02/22/2020   Lab Results  Component Value Date   LDLCALC 108 (H) 02/22/2020   Lab Results  Component Value Date   TRIG 48 02/22/2020   Lab Results  Component Value Date   CHOLHDL 2.9 02/22/2020   Lab Results  Component Value Date    HGBA1C 5.2 02/22/2020      Assessment & Plan:  1. Allergy desensitization therapy Patient given 0.74ml of Serum into bilateral posterior arms per request.  No site reactions.     Problem List Items Addressed This Visit   None   No orders of the defined types were placed in this encounter.   Follow-up: No follow-ups on file.    [name], NP

## 2021-02-27 ENCOUNTER — Ambulatory Visit: Payer: PRIVATE HEALTH INSURANCE | Admitting: Adult Health

## 2021-02-27 ENCOUNTER — Other Ambulatory Visit: Payer: Self-pay

## 2021-02-27 DIAGNOSIS — Z516 Encounter for desensitization to allergens: Secondary | ICD-10-CM

## 2021-02-27 NOTE — Progress Notes (Signed)
Established Patient Office Visit  Subjective:  Patient ID: Susan Mason, female    DOB: 11-Apr-1961  Age: 60 y.o. MRN: [id number]  CC: No chief complaint on file.   HPI [name] presents for immunotherapy injections.  She takes weekly injections, and has not had a reaction in some time.    Past Medical History:  Diagnosis Date   Abnormal Pap smear 2008   ASCUS   High blood pressure    Metrorrhagia    Osteopenia    Ovarian cyst    Yeast vaginitis     Past Surgical History:  Procedure Laterality Date   COLPOSCOPY  2008   neg bx    Family History  Problem Relation Age of Onset   Hyperlipidemia Mother    Hypertension Mother    Heart Problems Mother    Kidney disease Mother    Lung cancer Father    Emphysema Father    Breast cancer Neg Hx     Social History   Socioeconomic History   Marital status: Married    Spouse name: Not on file   Number of children: Not on file   Years of education: Not on file   Highest education level: Not on file  Occupational History   Not on file  Tobacco Use   Smoking status: Never   Smokeless tobacco: Never  Vaping Use   Vaping Use: Never used  Substance and Sexual Activity   Alcohol use: No   Drug use: No   Sexual activity: Not on file  Other Topics Concern   Not on file  Social History Narrative   Not on file   Social Determinants of Health   Financial Resource Strain: Not on file  Food Insecurity: Not on file  Transportation Needs: Not on file  Physical Activity: Not on file  Stress: Not on file  Social Connections: Not on file  Intimate Partner Violence: Not on file    Outpatient Medications Prior to Visit  Medication Sig Dispense Refill   cetirizine (ZYRTEC) 10 MG tablet Take 10 mg by mouth daily.      cholecalciferol (VITAMIN D) 1000 UNITS tablet Take 1,000 Units by mouth daily.     fexofenadine-pseudoephedrine (ALLEGRA-D 24) 180-240 MG 24 hr tablet Take 1 tablet by mouth daily.     lisinopril  (ZESTRIL) 5 MG tablet Take 1 tablet (5 mg total) by mouth daily. 90 tablet 2   Multiple Vitamins-Minerals (MULTIVITAMIN WITH MINERALS) tablet Take 1 tablet by mouth daily.     Probiotic Product (PROBIOTIC-10 PO) Take 1 tablet by mouth daily.     No facility-administered medications prior to visit.    Allergies  Allergen Reactions   Penicillins    Doxycycline    Erythromycin    Erythromycin Base     ROS Review of Systems  All other systems reviewed and are negative.    Objective:    Physical Exam Constitutional:      Appearance: Normal appearance.  Neurological:     Mental Status: She is alert.    There were no vitals taken for this visit. Wt Readings from Last 3 Encounters:  09/11/20 101 lb (45.8 kg)  03/06/20 102 lb 3.2 oz (46.4 kg)  07/17/19 105 lb (47.6 kg)     Health Maintenance Due  Topic Date Due   Pneumococcal Vaccine 64-67 Years old (1 - PCV) Never done   Zoster Vaccines- Shingrix (1 of 2) Never done   COVID-19 Vaccine (4 - Booster  for Moderna series) 09/17/2020    There are no preventive care reminders to display for this patient.  Lab Results  Component Value Date   TSH 0.40 06/13/2020   Lab Results  Component Value Date   WBC 6.1 10/25/2020   HGB 12.5 10/25/2020   HCT 36.7 10/25/2020   MCV 89.3 10/25/2020   PLT 257 10/25/2020   Lab Results  Component Value Date   NA 137 10/25/2020   K 3.9 10/25/2020   CO2 28 10/25/2020   GLUCOSE 97 10/25/2020   BUN 24 (H) 10/25/2020   CREATININE 1.38 (H) 10/25/2020   BILITOT 0.7 10/25/2020   ALKPHOS 45 10/25/2020   AST 22 10/25/2020   ALT 14 10/25/2020   PROT 6.6 10/25/2020   ALBUMIN 4.2 10/25/2020   CALCIUM 9.2 10/25/2020   ANIONGAP 5 10/25/2020   Lab Results  Component Value Date   CHOL 187 02/22/2020   Lab Results  Component Value Date   HDL 65 02/22/2020   Lab Results  Component Value Date   LDLCALC 108 (H) 02/22/2020   Lab Results  Component Value Date   TRIG 48 02/22/2020   Lab  Results  Component Value Date   CHOLHDL 2.9 02/22/2020   Lab Results  Component Value Date   HGBA1C 5.2 02/22/2020      Assessment & Plan:  1. Allergy desensitization therapy Shots given 0.5 ml in bilateral posterior upper arms.  Patient tolerated well.      Problem List Items Addressed This Visit   None   No orders of the defined types were placed in this encounter.   Follow-up: No follow-ups on file.    [name], NP

## 2021-03-06 ENCOUNTER — Encounter: Payer: Self-pay | Admitting: Family Medicine

## 2021-03-06 ENCOUNTER — Ambulatory Visit: Payer: PRIVATE HEALTH INSURANCE | Admitting: Family Medicine

## 2021-03-06 VITALS — BP 126/82

## 2021-03-06 DIAGNOSIS — Z516 Encounter for desensitization to allergens: Secondary | ICD-10-CM

## 2021-03-06 NOTE — Progress Notes (Signed)
Subjective   Patient ID: CARLETTA FEASEL, female   DOB: July 12, 1961, 60 y.o.   MRN: [id number]   HPI Mykenzi presents to the [facility] clinic today for her weekly allergy injections. She is feeling well today with no concerns. She tolerated her last injections well without any reaction.    Review of Systems Constitutional: Negative.   HENT: Negative.   Eyes: Negative.   Respiratory: Negative     Objective:    Physical Exam Vitals reviewed.  Constitutional:      General: She is not in acute distress.    Appearance: Normal appearance. She is well-developed.  HENT:     Head: Normocephalic and atraumatic.  Eyes:     General:        Right eye: No discharge.        Left eye: No discharge.  Pulmonary:     Effort: Pulmonary effort is normal. No respiratory distress.  Skin:    General: Skin is warm and dry.  Neurological:     Mental Status: She is alert and oriented to person, place, and time.  Psychiatric:        Mood and Affect: Mood normal.        Behavior: Behavior normal.     Today's Vitals   03/06/21 0851  BP: 126/82   There is no height or weight on file to calculate BMI.   Injection Note: Left arm serum contatins T-G-W, New vial today, 0.1 ml given SQ to left upper arm. Right arm serum contains M-DM-C-DG-CR-F, Exp: 09/03/2021, 0.5 ml given SQ to right upper arm.  Tolerated procedure well. No adverse reaction noted. Pt refused to stay the thirty minutes to monitor. She has epi pen with her. Will notify if any problems     Assessment:        Allergy desensitization therapy         Plan:      1. Continue to monitor for s/s allergic reaction. Carry epi-pen at all times. Notify if any problems. RTC in 1 week.

## 2021-03-09 ENCOUNTER — Other Ambulatory Visit: Payer: Self-pay | Admitting: Internal Medicine

## 2021-03-09 DIAGNOSIS — I10 Essential (primary) hypertension: Secondary | ICD-10-CM

## 2021-03-13 ENCOUNTER — Ambulatory Visit: Payer: PRIVATE HEALTH INSURANCE | Admitting: Family Medicine

## 2021-03-13 ENCOUNTER — Other Ambulatory Visit: Payer: Self-pay | Admitting: Family Medicine

## 2021-03-13 ENCOUNTER — Encounter: Payer: PRIVATE HEALTH INSURANCE | Admitting: Internal Medicine

## 2021-03-13 DIAGNOSIS — Z516 Encounter for desensitization to allergens: Secondary | ICD-10-CM

## 2021-03-13 NOTE — Progress Notes (Signed)
Subjective   Patient ID: Susan Mason, female   DOB: 1960-12-26, 60 y.o.   MRN: [id number]   HPI Jahnaya presents to the [facility] clinic today for her weekly allergy injections. She is feeling well today with no concerns. She tolerated her last injections well without any reaction.    Review of Systems Constitutional: Negative.   HENT: Negative.   Eyes: Negative.   Respiratory: Negative     Objective:    Physical Exam Vitals reviewed.  Constitutional:      General: She is not in acute distress.    Appearance: Normal appearance. She is well-developed.  HENT:     Head: Normocephalic and atraumatic.  Eyes:     General:        Right eye: No discharge.        Left eye: No discharge.  Pulmonary:     Effort: Pulmonary effort is normal. No respiratory distress.  Skin:    General: Skin is warm and dry.  Neurological:     Mental Status: She is alert and oriented to person, place, and time.  Psychiatric:        Mood and Affect: Mood normal.        Behavior: Behavior normal.     There were no vitals filed for this visit.  There is no height or weight on file to calculate BMI.   Injection Note: Left arm serum contatins T-G-W, 0.2 ml given SQ to left upper arm. Right arm serum contains M-DM-C-DG-CR-F, Exp: 09/03/2021, 0.5 ml given SQ to right upper arm.  Tolerated procedure well. No adverse reaction noted. Pt refused to stay the thirty minutes to monitor. She has epi pen with her. Will notify if any problems     Assessment:        Allergy desensitization therapy         Plan:      1. Continue to monitor for s/s allergic reaction. Carry epi-pen at all times. Notify if any problems. RTC in 1 week.

## 2021-03-14 LAB — COMPLETE METABOLIC PANEL WITH GFR
AG Ratio: 2 (calc) (ref 1.0–2.5)
ALT: 11 U/L (ref 6–29)
AST: 16 U/L (ref 10–35)
Albumin: 4.7 g/dL (ref 3.6–5.1)
Alkaline phosphatase (APISO): 56 U/L (ref 37–153)
BUN: 19 mg/dL (ref 7–25)
CO2: 27 mmol/L (ref 20–32)
Calcium: 9.9 mg/dL (ref 8.6–10.4)
Chloride: 102 mmol/L (ref 98–110)
Creat: 0.88 mg/dL (ref 0.50–1.05)
Globulin: 2.4 g/dL (calc) (ref 1.9–3.7)
Glucose, Bld: 83 mg/dL (ref 65–99)
Potassium: 4.1 mmol/L (ref 3.5–5.3)
Sodium: 137 mmol/L (ref 135–146)
Total Bilirubin: 0.6 mg/dL (ref 0.2–1.2)
Total Protein: 7.1 g/dL (ref 6.1–8.1)
eGFR: 75 mL/min/{1.73_m2} (ref >=60)

## 2021-03-14 LAB — LIPID PANEL
Cholesterol: 192 mg/dL (ref <200)
HDL: 64 mg/dL (ref >=50)
LDL Cholesterol (Calc): 112 mg/dL (calc) — ABNORMAL HIGH
Non-HDL Cholesterol (Calc): 128 mg/dL (calc) (ref <130)
Total CHOL/HDL Ratio: 3 (calc) (ref <5.0)
Triglycerides: 69 mg/dL (ref <150)

## 2021-03-14 LAB — CBC
HCT: 40.5 % (ref 35.0–45.0)
Hemoglobin: 13.2 g/dL (ref 11.7–15.5)
MCH: 30.3 pg (ref 27.0–33.0)
MCHC: 32.6 g/dL (ref 32.0–36.0)
MCV: 92.9 fL (ref 80.0–100.0)
MPV: 8.7 fL (ref 7.5–12.5)
Platelets: 257 10*3/uL (ref 140–400)
RBC: 4.36 10*6/uL (ref 3.80–5.10)
RDW: 12.7 % (ref 11.0–15.0)
WBC: 4.8 10*3/uL (ref 3.8–10.8)

## 2021-03-14 LAB — T4, FREE: Free T4: 1.2 ng/dL (ref 0.8–1.8)

## 2021-03-14 LAB — TSH: TSH: 0.51 mIU/L (ref 0.40–4.50)

## 2021-03-19 ENCOUNTER — Encounter: Payer: Self-pay | Admitting: Internal Medicine

## 2021-03-19 ENCOUNTER — Ambulatory Visit (INDEPENDENT_AMBULATORY_CARE_PROVIDER_SITE_OTHER): Payer: PRIVATE HEALTH INSURANCE | Admitting: Internal Medicine

## 2021-03-19 ENCOUNTER — Other Ambulatory Visit: Payer: Self-pay

## 2021-03-19 ENCOUNTER — Ambulatory Visit
Admission: RE | Admit: 2021-03-19 | Discharge: 2021-03-19 | Disposition: A | Discharge status: Home / Self Care | Payer: PRIVATE HEALTH INSURANCE | Source: Ambulatory Visit | Attending: Internal Medicine | Admitting: Internal Medicine

## 2021-03-19 VITALS — BP 130/80 | HR 69 | Temp 98.8°F | Ht 60.8 in | Wt 102.8 lb

## 2021-03-19 DIAGNOSIS — I10 Essential (primary) hypertension: Secondary | ICD-10-CM

## 2021-03-19 DIAGNOSIS — M542 Cervicalgia: Secondary | ICD-10-CM | POA: Diagnosis not present

## 2021-03-19 DIAGNOSIS — Z1231 Encounter for screening mammogram for malignant neoplasm of breast: Secondary | ICD-10-CM

## 2021-03-19 DIAGNOSIS — Z0000 Encounter for general adult medical examination without abnormal findings: Secondary | ICD-10-CM

## 2021-03-19 LAB — POCT URINALYSIS DIPSTICK
Bilirubin, UA: NEGATIVE
Blood, UA: NEGATIVE
Glucose, UA: NEGATIVE
Ketones, UA: NEGATIVE
Leukocytes, UA: NEGATIVE
Nitrite, UA: NEGATIVE
Protein, UA: NEGATIVE
Spec Grav, UA: 1.015 (ref 1.010–1.025)
Urobilinogen, UA: 0.2 E.U./dL
pH, UA: 7.5 (ref 5.0–8.0)

## 2021-03-19 LAB — POCT UA - MICROALBUMIN
Albumin/Creatinine Ratio, Urine, POC: <30
Creatinine, POC: 50 mg/dL
Microalbumin Ur, POC: 10 mg/L

## 2021-03-19 NOTE — Progress Notes (Signed)
I,Tianna Badgett,acting as a Neurosurgeon for [name], MD.,have documented all relevant documentation on the behalf of [name], MD,as directed by  [name], MD while in the presence of [name], MD.  This visit occurred during the SARS-CoV-2 public health emergency.  Safety protocols were in place, including screening questions prior to the visit, additional usage of staff PPE, and extensive cleaning of exam room while observing appropriate contact time as indicated for disinfecting solutions.  Subjective:     Patient ID: Susan Mason , female    DOB: [birth year] , 60 y.o.   MRN: [id number]   Chief Complaint  Patient presents with   Annual Exam    HPI  She is here today for a full physical examination. She has no specific concerns or complaints at this time. She plans to retire in 2023.   Hypertension This is a chronic problem. The current episode started more than 1 year ago. The problem has been gradually improving since onset. The problem is controlled. Associated symptoms include neck pain. Pertinent negatives include no blurred vision, chest pain, palpitations or shortness of breath. Risk factors for coronary artery disease include post-menopausal state. Past treatments include ACE inhibitors. The current treatment provides moderate improvement. There are no compliance problems.     Past Medical History:  Diagnosis Date   Abnormal Pap smear 2008   ASCUS   High blood pressure    Metrorrhagia    Osteopenia    Ovarian cyst    Yeast vaginitis      Family History  Problem Relation Age of Onset   Hyperlipidemia Mother    Hypertension Mother    Heart Problems Mother    Kidney disease Mother    Lung cancer Father    Emphysema Father    Breast cancer Neg Hx      Current Outpatient Medications:    cetirizine (ZYRTEC) 10 MG tablet, Take 10 mg by mouth daily. , Disp: , Rfl:    cholecalciferol (VITAMIN D) 1000 UNITS tablet, Take 1,000 Units by mouth  daily., Disp: , Rfl:    fexofenadine-pseudoephedrine (ALLEGRA-D 24) 180-240 MG 24 hr tablet, Take 1 tablet by mouth daily., Disp: , Rfl:    lisinopril (ZESTRIL) 5 MG tablet, TAKE ONE TABLET BY MOUTH DAILY, Disp: 30 tablet, Rfl: 1   Multiple Vitamins-Minerals (MULTIVITAMIN WITH MINERALS) tablet, Take 1 tablet by mouth daily., Disp: , Rfl:    Probiotic Product (PROBIOTIC-10 PO), Take 1 tablet by mouth daily., Disp: , Rfl:    Allergies  Allergen Reactions   Penicillins    Doxycycline    Erythromycin    Erythromycin Base      Review of Systems  Constitutional: Negative.   HENT: Negative.    Eyes: Negative.  Negative for blurred vision.  Respiratory: Negative.  Negative for shortness of breath.   Cardiovascular: Negative.  Negative for chest pain and palpitations.  Gastrointestinal: Negative.   Endocrine: Negative.   Genitourinary: Negative.   Musculoskeletal:  Positive for neck pain.       She c/o neck pain. Denies b/l UE weakness/paresthesias. Denies fall/trauma.   Skin: Negative.   Allergic/Immunologic: Negative.   Neurological: Negative.   Hematological: Negative.   Psychiatric/Behavioral: Negative.      Today's Vitals   03/19/21 1146  BP: 130/80  Pulse: 69  Temp: 98.8 F (37.1 C)  TempSrc: Oral  Weight: 102 lb 12.8 oz (46.6 kg)  Height: 5' 0.8" (1.544 m)   Body mass index  is 19.55 kg/m.  Wt Readings from Last 3 Encounters:  03/19/21 102 lb 12.8 oz (46.6 kg)  09/11/20 101 lb (45.8 kg)  03/06/20 102 lb 3.2 oz (46.4 kg)    Objective:  Physical Exam Vitals and nursing note reviewed.  Constitutional:      Appearance: Normal appearance.  HENT:     Head: Normocephalic and atraumatic.     Right Ear: Tympanic membrane, ear canal and external ear normal.     Left Ear: Tympanic membrane, ear canal and external ear normal.     Nose:     Comments: Masked     Mouth/Throat:     Comments: Masked  Eyes:     Extraocular Movements: Extraocular movements intact.      Conjunctiva/sclera: Conjunctivae normal.     Pupils: Pupils are equal, round, and reactive to light.  Cardiovascular:     Rate and Rhythm: Normal rate and regular rhythm.     Pulses: Normal pulses.     Heart sounds: Normal heart sounds.  Pulmonary:     Effort: Pulmonary effort is normal.     Breath sounds: Normal breath sounds.  Chest:  Breasts:    Tanner Score is 5.     Right: Normal.     Left: Normal.  Abdominal:     General: Abdomen is flat. Bowel sounds are normal.     Palpations: Abdomen is soft.  Genitourinary:    Comments: deferred Musculoskeletal:        General: Normal range of motion.     Cervical back: Normal range of motion and neck supple.  Skin:    General: Skin is warm and dry.  Neurological:     General: No focal deficit present.     Mental Status: She is alert and oriented to person, place, and time.  Psychiatric:        Mood and Affect: Mood normal.        Behavior: Behavior normal.        Assessment And Plan:     1. Routine general medical examination at a health care facility Comments: A full exam was performed. Importance of monthly self breast exams was discussed with the patient. PATIENT IS ADVISED TO GET 30-45 MINUTES REGULAR EXERCISE NO LESS THAN FOUR TO FIVE DAYS PER WEEK - BOTH WEIGHTBEARING EXERCISES AND AEROBIC ARE RECOMMENDED.  PATIENT IS ADVISED TO FOLLOW A HEALTHY DIET WITH AT LEAST SIX FRUITS/VEGGIES PER DAY, DECREASE INTAKE OF RED MEAT, AND TO INCREASE FISH INTAKE TO TWO DAYS PER WEEK.  MEATS/FISH SHOULD NOT BE FRIED, BAKED OR BROILED IS PREFERABLE.  IT IS ALSO IMPORTANT TO CUT BACK ON YOUR SUGAR INTAKE. PLEASE AVOID ANYTHING WITH ADDED SUGAR, CORN SYRUP OR OTHER SWEETENERS. IF YOU MUST USE A SWEETENER, YOU CAN TRY STEVIA. IT IS ALSO IMPORTANT TO AVOID ARTIFICIALLY SWEETENERS AND DIET BEVERAGES. LASTLY, I SUGGEST WEARING SPF 50 SUNSCREEN ON EXPOSED PARTS AND ESPECIALLY WHEN IN THE DIRECT SUNLIGHT FOR AN EXTENDED PERIOD OF TIME.  PLEASE AVOID FAST  FOOD RESTAURANTS AND INCREASE YOUR WATER INTAKE.  2. Essential hypertension Comments: Chronic, controlled. EKG performed, NSR w/o acute changes. Encouraged to follow low soidum diet. She will f/u in 6 months for re-evaluation.  - POCT Urinalysis Dipstick (81002) - POCT UA - Microalbumin - EKG 12-Lead  3. Neck pain, bilateral Comments: Occurs during times of stress - often at work. She was given some stretches to perform. She will let me know if her sx persist.    Patient  was given opportunity to ask questions. Patient verbalized understanding of the plan and was able to repeat key elements of the plan. All questions were answered to their satisfaction.   I, [name], MD, have reviewed all documentation for this visit. The documentation on 03/30/21 for the exam, diagnosis, procedures, and orders are all accurate and complete.   IF YOU HAVE BEEN REFERRED TO A SPECIALIST, IT MAY TAKE 1-2 WEEKS TO SCHEDULE/PROCESS THE REFERRAL. IF YOU HAVE NOT HEARD FROM US/SPECIALIST IN TWO WEEKS, PLEASE GIVE [place] A CALL AT [phone].   THE PATIENT IS ENCOURAGED TO PRACTICE SOCIAL DISTANCING DUE TO THE COVID-19 PANDEMIC.

## 2021-03-19 NOTE — Patient Instructions (Addendum)
The 10-year ASCVD risk score Susan Mason., et al., 2013) is: 4.5%   Values used to calculate the score:     Age: 60 years     Sex: Female     Is Non-Hispanic African American: No     Diabetic: No     Tobacco smoker: No     Systolic Blood Pressure: 130 mmHg     Is BP treated: Yes     HDL Cholesterol: 64 mg/dL     Total Cholesterol: 192 mg/dL   Health Maintenance, Female Adopting a healthy lifestyle and getting preventive care are important in promoting health and wellness. Ask your health care provider about: The right schedule for you to have regular tests and exams. Things you can do on your own to prevent diseases and keep yourself healthy. What should I know about diet, weight, and exercise? Eat a healthy diet  Eat a diet that includes plenty of vegetables, fruits, low-fat dairy products, and lean protein. Do not eat a lot of foods that are high in solid fats, added sugars, or sodium.  Maintain a healthy weight Body mass index (BMI) is used to identify weight problems. It estimates body fat based on height and weight. Your health care provider can help determineyour BMI and help you achieve or maintain a healthy weight. Get regular exercise Get regular exercise. This is one of the most important things you can do for your health. Most adults should: Exercise for at least 150 minutes each week. The exercise should increase your heart rate and make you sweat (moderate-intensity exercise). Do strengthening exercises at least twice a week. This is in addition to the moderate-intensity exercise. Spend less time sitting. Even light physical activity can be beneficial. Watch cholesterol and blood lipids Have your blood tested for lipids and cholesterol at 61 years of age, then havethis test every 5 years. Have your cholesterol levels checked more often if: Your lipid or cholesterol levels are high. You are older than 61 years of age. You are at high risk for heart disease. What should  I know about cancer screening? Depending on your health history and family history, you may need to have cancer screening at various ages. This may include screening for: Breast cancer. Cervical cancer. Colorectal cancer. Skin cancer. Lung cancer. What should I know about heart disease, diabetes, and high blood pressure? Blood pressure and heart disease High blood pressure causes heart disease and increases the risk of stroke. This is more likely to develop in people who have high blood pressure readings, are of African descent, or are overweight. Have your blood pressure checked: Every 3-5 years if you are 24-56 years of age. Every year if you are 16 years old or older. Diabetes Have regular diabetes screenings. This checks your fasting blood sugar level. Have the screening done: Once every three years after age 56 if you are at a normal weight and have a low risk for diabetes. More often and at a younger age if you are overweight or have a high risk for diabetes. What should I know about preventing infection? Hepatitis B If you have a higher risk for hepatitis B, you should be screened for this virus. Talk with your health care provider to find out if you are at risk forhepatitis B infection. Hepatitis C Testing is recommended for: Everyone born from [birth year] through 1966. Anyone with known risk factors for hepatitis C. Sexually transmitted infections (STIs) Get screened for STIs, including gonorrhea and chlamydia, if: You  are sexually active and are younger than 61 years of age. You are older than 61 years of age and your health care provider tells you that you are at risk for this type of infection. Your sexual activity has changed since you were last screened, and you are at increased risk for chlamydia or gonorrhea. Ask your health care provider if you are at risk. Ask your health care provider about whether you are at high risk for HIV. Your health care provider may recommend a  prescription medicine to help prevent HIV infection. If you choose to take medicine to prevent HIV, you should first get tested for HIV. You should then be tested every 3 months for as long as you are taking the medicine. Pregnancy If you are about to stop having your period (premenopausal) and you may become pregnant, seek counseling before you get pregnant. Take 400 to 800 micrograms (mcg) of folic acid every day if you become pregnant. Ask for birth control (contraception) if you want to prevent pregnancy. Osteoporosis and menopause Osteoporosis is a disease in which the bones lose minerals and strength with aging. This can result in bone fractures. If you are 47 years old or older, or if you are at risk for osteoporosis and fractures, ask your health care provider if you should: Be screened for bone loss. Take a calcium or vitamin D supplement to lower your risk of fractures. Be given hormone replacement therapy (HRT) to treat symptoms of menopause. Follow these instructions at home: Lifestyle Do not use any products that contain nicotine or tobacco, such as cigarettes, e-cigarettes, and chewing tobacco. If you need help quitting, ask your health care provider. Do not use street drugs. Do not share needles. Ask your health care provider for help if you need support or information about quitting drugs. Alcohol use Do not drink alcohol if: Your health care provider tells you not to drink. You are pregnant, may be pregnant, or are planning to become pregnant. If you drink alcohol: Limit how much you use to 0-1 drink a day. Limit intake if you are breastfeeding. Be aware of how much alcohol is in your drink. In the [place] one drink equals one 12 oz bottle of beer (355 mL), one 5 oz glass of wine (148 mL), or one 1 oz glass of hard liquor (44 mL). General instructions Schedule regular health, dental, and eye exams. Stay current with your vaccines. Tell your health care provider if: You  often feel depressed. You have ever been abused or do not feel safe at home. Summary Adopting a healthy lifestyle and getting preventive care are important in promoting health and wellness. Follow your health care provider's instructions about healthy diet, exercising, and getting tested or screened for diseases. Follow your health care provider's instructions on monitoring your cholesterol and blood pressure. This information is not intended to replace advice given to you by your health care provider. Make sure you discuss any questions you have with your healthcare provider. Document Revised: 07/13/2018 Document Reviewed: 07/13/2018 Elsevier Patient Education  2022 [employer].

## 2021-03-20 ENCOUNTER — Ambulatory Visit: Payer: PRIVATE HEALTH INSURANCE | Admitting: Family Medicine

## 2021-03-20 VITALS — BP 130/80 | HR 61

## 2021-03-20 DIAGNOSIS — Z516 Encounter for desensitization to allergens: Secondary | ICD-10-CM

## 2021-03-20 NOTE — Progress Notes (Signed)
Subjective   Patient ID: Susan Mason, female   DOB: June 17, 1961, 60 y.o.   MRN: [id number]   HPI Susan Mason presents to the [facility] clinic today for her weekly allergy injections. She is feeling well today with no concerns. She tolerated her last injections well without any reaction.    Review of Systems Constitutional: Negative.   HENT: Negative.   Eyes: Negative.   Respiratory: Negative     Objective:    Physical Exam Vitals reviewed.  Constitutional:      General: She is not in acute distress.    Appearance: Normal appearance. She is well-developed.  HENT:     Head: Normocephalic and atraumatic.  Eyes:     General:        Right eye: No discharge.        Left eye: No discharge.  Pulmonary:     Effort: Pulmonary effort is normal. No respiratory distress.  Skin:    General: Skin is warm and dry.  Neurological:     Mental Status: She is alert and oriented to person, place, and time.  Psychiatric:        Mood and Affect: Mood normal.        Behavior: Behavior normal.     Today's Vitals   03/20/21 0833  BP: 130/80  Pulse: 61  SpO2: 99%    There is no height or weight on file to calculate BMI.   Injection Note: Left arm serum contatins T-G-W, 0.3 ml given SQ to left upper arm. Right arm serum contains M-DM-C-DG-CR-F, Exp: 09/03/2021, 0.5 ml given SQ to right upper arm.  Tolerated procedure well. No adverse reaction noted. Pt refused to stay the thirty minutes to monitor. She has epi pen with her. Will notify if any problems     Assessment:        Allergy desensitization therapy         Plan:      1. Continue to monitor for s/s allergic reaction. Carry epi-pen at all times. Notify if any problems. RTC in 1 week.

## 2021-03-27 ENCOUNTER — Ambulatory Visit: Payer: PRIVATE HEALTH INSURANCE

## 2021-04-03 ENCOUNTER — Ambulatory Visit: Payer: PRIVATE HEALTH INSURANCE | Admitting: Family

## 2021-04-03 VITALS — BP 118/64

## 2021-04-03 DIAGNOSIS — Z516 Encounter for desensitization to allergens: Secondary | ICD-10-CM

## 2021-04-03 NOTE — Progress Notes (Signed)
Subjective   Patient ID: Susan Mason, female   DOB: 27-Oct-1960, 60 y.o.   MRN: [id number]   HPI Shanty presents to the [facility] clinic today for her weekly allergy injections. She is feeling well today with no concerns. She tolerated her last injections well without any reaction.    Review of Systems Constitutional: Negative.   HENT: Negative.   Eyes: Negative.   Respiratory: Negative     Objective:    Physical Exam Vitals reviewed.  Constitutional:      General: She is not in acute distress.    Appearance: Normal appearance. She is well-developed.  HENT:     Head: Normocephalic and atraumatic.  Eyes:     General:        Right eye: No discharge.        Left eye: No discharge.  Pulmonary:     Effort: Pulmonary effort is normal. No respiratory distress.  Skin:    General: Skin is warm and dry.  Neurological:     Mental Status: She is alert and oriented to person, place, and time.  Psychiatric:        Mood and Affect: Mood normal.        Behavior: Behavior normal.     Today's Vitals   04/03/2021  BP: 118/64  Pulse: 61  SpO2: 99%    There is no height or weight on file to calculate BMI.   Injection Note: Left arm was given 0.5 ml. Right arm was given 0.1 ml SQ as ordered. Tolerated procedure well. No adverse reaction noted. Pt refused to stay the thirty minutes to monitor. She has epi pen with her. Will notify if any problems     Assessment:        Allergy desensitization therapy         Plan:      1. Continue to monitor for s/s allergic reaction. Carry epi-pen at all times. Notify if any problems. RTC in 1 week.        Established Patient Office Visit  Subjective:  Patient ID: Susan Mason, female    DOB: 21-May-1961  Age: 60 y.o. MRN: [id number]  CC: No chief complaint on file.   HPI Susan Mason presents for allergy injections.   Past Medical History:  Diagnosis Date   Abnormal Pap smear 2008   ASCUS   High blood pressure     Metrorrhagia    Osteopenia    Ovarian cyst    Yeast vaginitis     Past Surgical History:  Procedure Laterality Date   COLPOSCOPY  2008   neg bx    Family History  Problem Relation Age of Onset   Hyperlipidemia Mother    Hypertension Mother    Heart Problems Mother    Kidney disease Mother    Lung cancer Father    Emphysema Father    Breast cancer Neg Hx     Social History   Socioeconomic History   Marital status: Married    Spouse name: Not on file   Number of children: Not on file   Years of education: Not on file   Highest education level: Not on file  Occupational History   Not on file  Tobacco Use   Smoking status: Never   Smokeless tobacco: Never  Vaping Use   Vaping Use: Never used  Substance and Sexual Activity   Alcohol use: No   Drug use: No   Sexual activity: Not on file  Other  Topics Concern   Not on file  Social History Narrative   Not on file   Social Determinants of Health   Financial Resource Strain: Not on file  Food Insecurity: Not on file  Transportation Needs: Not on file  Physical Activity: Not on file  Stress: Not on file  Social Connections: Not on file  Intimate Partner Violence: Not on file    Outpatient Medications Prior to Visit  Medication Sig Dispense Refill   cetirizine (ZYRTEC) 10 MG tablet Take 10 mg by mouth daily.      cholecalciferol (VITAMIN D) 1000 UNITS tablet Take 1,000 Units by mouth daily.     fexofenadine-pseudoephedrine (ALLEGRA-D 24) 180-240 MG 24 hr tablet Take 1 tablet by mouth daily.     lisinopril (ZESTRIL) 5 MG tablet TAKE ONE TABLET BY MOUTH DAILY 30 tablet 1   Multiple Vitamins-Minerals (MULTIVITAMIN WITH MINERALS) tablet Take 1 tablet by mouth daily.     Probiotic Product (PROBIOTIC-10 PO) Take 1 tablet by mouth daily.     No facility-administered medications prior to visit.    Allergies  Allergen Reactions   Penicillins    Doxycycline    Erythromycin    Erythromycin Base     ROS Review  of Systems    Objective:    Physical Exam  There were no vitals taken for this visit. Wt Readings from Last 3 Encounters:  03/19/21 102 lb 12.8 oz (46.6 kg)  09/11/20 101 lb (45.8 kg)  03/06/20 102 lb 3.2 oz (46.4 kg)     Health Maintenance Due  Topic Date Due   Pneumococcal Vaccine 44-81 Years old (1 - PCV) Never done   HIV Screening  Never done   Zoster Vaccines- Shingrix (1 of 2) Never done   INFLUENZA VACCINE  03/03/2021    There are no preventive care reminders to display for this patient.  Lab Results  Component Value Date   TSH 0.51 03/13/2021   Lab Results  Component Value Date   WBC 4.8 03/13/2021   HGB 13.2 03/13/2021   HCT 40.5 03/13/2021   MCV 92.9 03/13/2021   PLT 257 03/13/2021   Lab Results  Component Value Date   NA 137 03/13/2021   K 4.1 03/13/2021   CO2 27 03/13/2021   GLUCOSE 83 03/13/2021   BUN 19 03/13/2021   CREATININE 0.88 03/13/2021   BILITOT 0.6 03/13/2021   ALKPHOS 45 10/25/2020   AST 16 03/13/2021   ALT 11 03/13/2021   PROT 7.1 03/13/2021   ALBUMIN 4.2 10/25/2020   CALCIUM 9.9 03/13/2021   ANIONGAP 5 10/25/2020   EGFR 75 03/13/2021   Lab Results  Component Value Date   CHOL 192 03/13/2021   Lab Results  Component Value Date   HDL 64 03/13/2021   Lab Results  Component Value Date   LDLCALC 112 (H) 03/13/2021   Lab Results  Component Value Date   TRIG 69 03/13/2021   Lab Results  Component Value Date   CHOLHDL 3.0 03/13/2021   Lab Results  Component Value Date   HGBA1C 5.2 02/22/2020      Assessment & Plan:   Problem List Items Addressed This Visit   None   No orders of the defined types were placed in this encounter.   Follow-up: No follow-ups on file.    Susan Simon, NP

## 2021-04-10 ENCOUNTER — Ambulatory Visit: Payer: PRIVATE HEALTH INSURANCE | Admitting: Family Medicine

## 2021-04-10 VITALS — BP 132/78

## 2021-04-10 DIAGNOSIS — Z516 Encounter for desensitization to allergens: Secondary | ICD-10-CM

## 2021-04-10 NOTE — Progress Notes (Signed)
Subjective   Patient ID: Susan Mason, female   DOB: 08-31-1960, 60 y.o.   MRN: [id number]   HPI Susan Mason presents to the [facility] clinic today for her weekly allergy injections. She is feeling well today with no concerns. She tolerated her last injections well without any reaction.    Review of Systems Constitutional: Negative.   HENT: Negative.   Eyes: Negative.   Respiratory: Negative     Objective:    Physical Exam Vitals reviewed.  Constitutional:      General: She is not in acute distress.    Appearance: Normal appearance. She is well-developed.  HENT:     Head: Normocephalic and atraumatic.  Eyes:     General:        Right eye: No discharge.        Left eye: No discharge.  Pulmonary:     Effort: Pulmonary effort is normal. No respiratory distress.  Skin:    General: Skin is warm and dry.  Neurological:     Mental Status: She is alert and oriented to person, place, and time.  Psychiatric:        Mood and Affect: Mood normal.        Behavior: Behavior normal.     Today's Vitals   04/10/21 0902  BP: 132/78    There is no height or weight on file to calculate BMI.   Injection Note: Left arm serum contatins T-G-W, 0.5 ml given SQ to left upper arm. Right arm serum contains M-DM-C-DG-CR-F, 0.2 ml given SQ to right upper arm.  Tolerated procedure well. No adverse reaction noted. Pt refused to stay the thirty minutes to monitor. She has epi pen with her. Will notify if any problems     Assessment:        Allergy desensitization therapy         Plan:      1. Continue to monitor for s/s allergic reaction. Carry epi-pen at all times. Notify if any problems. RTC in 1 week.

## 2021-04-17 ENCOUNTER — Ambulatory Visit: Payer: PRIVATE HEALTH INSURANCE | Admitting: Family Medicine

## 2021-04-17 VITALS — BP 132/84

## 2021-04-17 DIAGNOSIS — Z516 Encounter for desensitization to allergens: Secondary | ICD-10-CM

## 2021-04-17 NOTE — Progress Notes (Signed)
Subjective   Patient ID: Susan Mason, female   DOB: 1961-05-12, 60 y.o.   MRN: [id number]   HPI Suman presents to the [facility] clinic today for her weekly allergy injections. She is feeling well today with no concerns. She tolerated her last injections well without any reaction.    Review of Systems Constitutional: Negative.   HENT: Negative.   Eyes: Negative.   Respiratory: Negative     Objective:    Physical Exam Vitals reviewed.  Constitutional:      General: She is not in acute distress.    Appearance: Normal appearance. She is well-developed.  HENT:     Head: Normocephalic and atraumatic.  Eyes:     General:        Right eye: No discharge.        Left eye: No discharge.  Pulmonary:     Effort: Pulmonary effort is normal. No respiratory distress.  Skin:    General: Skin is warm and dry.  Neurological:     Mental Status: She is alert and oriented to person, place, and time.  Psychiatric:        Mood and Affect: Mood normal.        Behavior: Behavior normal.     Today's Vitals   04/17/21 0946  BP: 132/84    There is no height or weight on file to calculate BMI.   Injection Note: Left arm serum contatins T-G-W, 0.5 ml given SQ to left upper arm. Right arm serum contains M-DM-C-DG-CR-F, 0.3 ml given SQ to right upper arm.  Tolerated procedure well. No adverse reaction noted. Pt refused to stay the thirty minutes to monitor. She has epi pen with her. Will notify if any problems     Assessment:        Allergy desensitization therapy         Plan:      1. Continue to monitor for s/s allergic reaction. Carry epi-pen at all times. Notify if any problems. RTC in 1 week.

## 2021-04-24 ENCOUNTER — Ambulatory Visit: Payer: PRIVATE HEALTH INSURANCE | Admitting: Family Medicine

## 2021-04-24 VITALS — BP 130/80

## 2021-04-24 DIAGNOSIS — Z516 Encounter for desensitization to allergens: Secondary | ICD-10-CM

## 2021-04-24 NOTE — Progress Notes (Signed)
Subjective   Patient ID: Susan Mason, female   DOB: February 02, 1961, 60 y.o.   MRN: [id number]   HPI Myrth presents to the [facility] clinic today for her weekly allergy injections. She is feeling well today with no concerns. She tolerated her last injections well without any reaction.    Review of Systems Constitutional: Negative.   HENT: Negative.   Eyes: Negative.   Respiratory: Negative     Objective:    Physical Exam Vitals reviewed.  Constitutional:      General: She is not in acute distress.    Appearance: Normal appearance. She is well-developed.  HENT:     Head: Normocephalic and atraumatic.  Eyes:     General:        Right eye: No discharge.        Left eye: No discharge.  Pulmonary:     Effort: Pulmonary effort is normal. No respiratory distress.  Skin:    General: Skin is warm and dry.  Neurological:     Mental Status: She is alert and oriented to person, place, and time.  Psychiatric:        Mood and Affect: Mood normal.        Behavior: Behavior normal.     Today's Vitals   04/24/21 0947  BP: 130/80    There is no height or weight on file to calculate BMI.   Injection Note: Left arm serum contatins T-G-W, 0.5 ml given SQ to left upper arm. Right arm serum contains M-DM-C-DG-CR-F, 0.4 ml given SQ to right upper arm.  Tolerated procedure well. No adverse reaction noted. Pt refused to stay the thirty minutes to monitor. She has epi pen with her. Will notify if any problems     Assessment:        Allergy desensitization therapy         Plan:      1. Continue to monitor for s/s allergic reaction. Carry epi-pen at all times. Notify if any problems. RTC in 1 week.

## 2021-05-01 ENCOUNTER — Ambulatory Visit: Payer: PRIVATE HEALTH INSURANCE | Admitting: Family Medicine

## 2021-05-01 VITALS — BP 128/80

## 2021-05-01 DIAGNOSIS — Z516 Encounter for desensitization to allergens: Secondary | ICD-10-CM

## 2021-05-01 NOTE — Progress Notes (Signed)
Subjective   Patient ID: Susan Mason, female   DOB: [birth year], 60 y.o.   MRN: [id number]   HPI Sharmane presents to the [facility] clinic today for her weekly allergy injections. She is feeling well today with no concerns. She tolerated her last injections well without any reaction.    Review of Systems Constitutional: Negative.   HENT: Negative.   Eyes: Negative.   Respiratory: Negative     Objective:    Physical Exam Vitals reviewed.  Constitutional:      General: She is not in acute distress.    Appearance: Normal appearance. She is well-developed.  HENT:     Head: Normocephalic and atraumatic.  Eyes:     General:        Right eye: No discharge.        Left eye: No discharge.  Pulmonary:     Effort: Pulmonary effort is normal. No respiratory distress.  Skin:    General: Skin is warm and dry.  Neurological:     Mental Status: She is alert and oriented to person, place, and time.  Psychiatric:        Mood and Affect: Mood normal.        Behavior: Behavior normal.     Today's Vitals   05/01/21 0858  BP: 128/80    There is no height or weight on file to calculate BMI.   Injection Note: Left arm serum contatins T-G-W, 0.5 ml given SQ to left upper arm. Right arm serum contains M-DM-C-DG-CR-F, 0.5 ml given SQ to right upper arm.  Tolerated procedure well. No adverse reaction noted. Pt refused to stay the thirty minutes to monitor. She has epi pen with her. Will notify if any problems     Assessment:        Allergy desensitization therapy         Plan:      1. Continue to monitor for s/s allergic reaction. Carry epi-pen at all times. Notify if any problems. RTC in 1 week.

## 2021-05-10 ENCOUNTER — Other Ambulatory Visit: Payer: Self-pay | Admitting: Internal Medicine

## 2021-05-10 DIAGNOSIS — I10 Essential (primary) hypertension: Secondary | ICD-10-CM

## 2021-05-22 ENCOUNTER — Ambulatory Visit: Payer: PRIVATE HEALTH INSURANCE | Admitting: Family

## 2021-05-22 DIAGNOSIS — Z516 Encounter for desensitization to allergens: Secondary | ICD-10-CM

## 2021-05-22 NOTE — Progress Notes (Signed)
Subjective   Patient ID: Susan Mason, female   DOB: January 09, 1961, 60 y.o.   MRN: [id number]   HPI Susan Mason presents to the [facility] clinic today for her weekly allergy injections. She is feeling well today with no concerns. She tolerated her last injections well without any reaction.    Review of Systems Constitutional: Negative.   HENT: Negative.   Eyes: Negative.   Respiratory: Negative     Objective:    Physical Exam Vitals reviewed.  Constitutional:      General: She is not in acute distress.    Appearance: Normal appearance. She is well-developed.  HENT:     Head: Normocephalic and atraumatic.  Eyes:     General:        Right eye: No discharge.        Left eye: No discharge.  Pulmonary:     Effort: Pulmonary effort is normal. No respiratory distress.  Skin:    General: Skin is warm and dry.  Neurological:     Mental Status: She is alert and oriented to person, place, and time.  Psychiatric:        Mood and Affect: Mood normal.        Behavior: Behavior normal.     Today's Vitals   04/03/2021  BP: 118/64  Pulse: 61  SpO2: 99%    There is no height or weight on file to calculate BMI.   Injection Note: Left arm was given 0.5 ml. Right arm was given 0.1 ml SQ as ordered. Tolerated procedure well. No adverse reaction noted. Pt refused to stay the thirty minutes to monitor. She has epi pen with her. Will notify if any problems     Assessment:        Allergy desensitization therapy         Plan:      1. Continue to monitor for s/s allergic reaction. Carry epi-pen at all times. Notify if any problems. RTC in 1 week.        Established Patient Office Visit  Subjective:  Patient ID: Susan Mason, female    DOB: 1961/02/25  Age: 60 y.o. MRN: [id number]  CC: No chief complaint on file.   HPI [facility] presents for allergy injections.   Past Medical History:  Diagnosis Date   Abnormal Pap smear 2008   ASCUS   High blood pressure    Metrorrhagia     Osteopenia    Ovarian cyst    Yeast vaginitis     Past Surgical History:  Procedure Laterality Date   COLPOSCOPY  2008   neg bx    Family History  Problem Relation Age of Onset   Hyperlipidemia Mother    Hypertension Mother    Heart Problems Mother    Kidney disease Mother    Lung cancer Father    Emphysema Father    Breast cancer Neg Hx     Social History   Socioeconomic History   Marital status: Married    Spouse name: Not on file   Number of children: Not on file   Years of education: Not on file   Highest education level: Not on file  Occupational History   Not on file  Tobacco Use   Smoking status: Never   Smokeless tobacco: Never  Vaping Use   Vaping Use: Never used  Substance and Sexual Activity   Alcohol use: No   Drug use: No   Sexual activity: Not on file  Other Topics Concern  Not on file  Social History Narrative   Not on file   Social Determinants of Health   Financial Resource Strain: Not on file  Food Insecurity: Not on file  Transportation Needs: Not on file  Physical Activity: Not on file  Stress: Not on file  Social Connections: Not on file  Intimate Partner Violence: Not on file    Outpatient Medications Prior to Visit  Medication Sig Dispense Refill   cetirizine (ZYRTEC) 10 MG tablet Take 10 mg by mouth daily.      cholecalciferol (VITAMIN D) 1000 UNITS tablet Take 1,000 Units by mouth daily.     fexofenadine-pseudoephedrine (ALLEGRA-D 24) 180-240 MG 24 hr tablet Take 1 tablet by mouth daily.     lisinopril (ZESTRIL) 5 MG tablet TAKE ONE TABLET BY MOUTH DAILY 30 tablet 1   Multiple Vitamins-Minerals (MULTIVITAMIN WITH MINERALS) tablet Take 1 tablet by mouth daily.     Probiotic Product (PROBIOTIC-10 PO) Take 1 tablet by mouth daily.     No facility-administered medications prior to visit.    Allergies  Allergen Reactions   Penicillins    Doxycycline    Erythromycin    Erythromycin Base     ROS Review of Systems     Objective:    Physical Exam  There were no vitals taken for this visit. Wt Readings from Last 3 Encounters:  03/19/21 102 lb 12.8 oz (46.6 kg)  09/11/20 101 lb (45.8 kg)  03/06/20 102 lb 3.2 oz (46.4 kg)     Health Maintenance Due  Topic Date Due   Pneumococcal Vaccine 68-53 Years old (1 - PCV) Never done   HIV Screening  Never done   Zoster Vaccines- Shingrix (1 of 2) Never done   INFLUENZA VACCINE  03/03/2021   COVID-19 Vaccine (5 - Booster for Moderna series) 03/07/2021    There are no preventive care reminders to display for this patient.  Lab Results  Component Value Date   TSH 0.51 03/13/2021   Lab Results  Component Value Date   WBC 4.8 03/13/2021   HGB 13.2 03/13/2021   HCT 40.5 03/13/2021   MCV 92.9 03/13/2021   PLT 257 03/13/2021   Lab Results  Component Value Date   NA 137 03/13/2021   K 4.1 03/13/2021   CO2 27 03/13/2021   GLUCOSE 83 03/13/2021   BUN 19 03/13/2021   CREATININE 0.88 03/13/2021   BILITOT 0.6 03/13/2021   ALKPHOS 45 10/25/2020   AST 16 03/13/2021   ALT 11 03/13/2021   PROT 7.1 03/13/2021   ALBUMIN 4.2 10/25/2020   CALCIUM 9.9 03/13/2021   ANIONGAP 5 10/25/2020   EGFR 75 03/13/2021   Lab Results  Component Value Date   CHOL 192 03/13/2021   Lab Results  Component Value Date   HDL 64 03/13/2021   Lab Results  Component Value Date   LDLCALC 112 (H) 03/13/2021   Lab Results  Component Value Date   TRIG 69 03/13/2021   Lab Results  Component Value Date   CHOLHDL 3.0 03/13/2021   Lab Results  Component Value Date   HGBA1C 5.2 02/22/2020      Assessment & Plan:   Problem List Items Addressed This Visit   None   No orders of the defined types were placed in this encounter.   Follow-up: No follow-ups on file.    Jerre Simon, NP

## 2021-06-05 ENCOUNTER — Ambulatory Visit: Payer: PRIVATE HEALTH INSURANCE | Admitting: Family

## 2021-06-05 VITALS — BP 118/62

## 2021-06-05 DIAGNOSIS — Z516 Encounter for desensitization to allergens: Secondary | ICD-10-CM

## 2021-06-05 NOTE — Progress Notes (Signed)
Established Patient Office Visit  Subjective:  Patient ID: Susan Mason, female    DOB: Mar 05, 1961  Age: 60 y.o. MRN: [id number]  CC: History of allergic asthma   HPI [name] presents for allergy injections. She is feeling well today with no concerns. She tolerated her last injections well without any reaction.   Past Medical History:  Diagnosis Date   Abnormal Pap smear 2008   ASCUS   High blood pressure    Metrorrhagia    Osteopenia    Ovarian cyst    Yeast vaginitis     Past Surgical History:  Procedure Laterality Date   COLPOSCOPY  2008   neg bx    Family History  Problem Relation Age of Onset   Hyperlipidemia Mother    Hypertension Mother    Heart Problems Mother    Kidney disease Mother    Lung cancer Father    Emphysema Father    Breast cancer Neg Hx     Social History   Socioeconomic History   Marital status: Married    Spouse name: Not on file   Number of children: Not on file   Years of education: Not on file   Highest education level: Not on file  Occupational History   Not on file  Tobacco Use   Smoking status: Never   Smokeless tobacco: Never  Vaping Use   Vaping Use: Never used  Substance and Sexual Activity   Alcohol use: No   Drug use: No   Sexual activity: Not on file  Other Topics Concern   Not on file  Social History Narrative   Not on file   Social Determinants of Health   Financial Resource Strain: Not on file  Food Insecurity: Not on file  Transportation Needs: Not on file  Physical Activity: Not on file  Stress: Not on file  Social Connections: Not on file  Intimate Partner Violence: Not on file    Outpatient Medications Prior to Visit  Medication Sig Dispense Refill   cetirizine (ZYRTEC) 10 MG tablet Take 10 mg by mouth daily.      cholecalciferol (VITAMIN D) 1000 UNITS tablet Take 1,000 Units by mouth daily.     fexofenadine-pseudoephedrine (ALLEGRA-D 24) 180-240 MG 24 hr tablet Take 1 tablet by mouth daily.      lisinopril (ZESTRIL) 5 MG tablet TAKE ONE TABLET BY MOUTH DAILY 30 tablet 1   Multiple Vitamins-Minerals (MULTIVITAMIN WITH MINERALS) tablet Take 1 tablet by mouth daily.     Probiotic Product (PROBIOTIC-10 PO) Take 1 tablet by mouth daily.     No facility-administered medications prior to visit.    Allergies  Allergen Reactions   Penicillins    Doxycycline    Erythromycin    Erythromycin Base     ROS Review of Systems  Allergic/Immunologic: Positive for environmental allergies.  All other systems reviewed and are negative.    Objective:    Physical Exam Vitals reviewed.  Constitutional:      Appearance: Normal appearance. She is normal weight.  HENT:     Head: Normocephalic.  Eyes:     Pupils: Pupils are equal, round, and reactive to light.  Musculoskeletal:        General: Normal range of motion.  Skin:    General: Skin is warm and dry.  Neurological:     General: No focal deficit present.     Mental Status: She is alert.  Psychiatric:        Mood and Affect:  Mood normal.        Behavior: Behavior normal.        Thought Content: Thought content normal.    BP 118/62  Wt Readings from Last 3 Encounters:  03/19/21 102 lb 12.8 oz (46.6 kg)  09/11/20 101 lb (45.8 kg)  03/06/20 102 lb 3.2 oz (46.4 kg)     Health Maintenance Due  Topic Date Due   Pneumococcal Vaccine 46-43 Years old (1 - PCV) Never done   HIV Screening  Never done   Zoster Vaccines- Shingrix (1 of 2) Never done   INFLUENZA VACCINE  03/03/2021   COVID-19 Vaccine (5 - Booster for Moderna series) 03/07/2021    There are no preventive care reminders to display for this patient.  Lab Results  Component Value Date   TSH 0.51 03/13/2021   Lab Results  Component Value Date   WBC 4.8 03/13/2021   HGB 13.2 03/13/2021   HCT 40.5 03/13/2021   MCV 92.9 03/13/2021   PLT 257 03/13/2021   Lab Results  Component Value Date   NA 137 03/13/2021   K 4.1 03/13/2021   CO2 27 03/13/2021    GLUCOSE 83 03/13/2021   BUN 19 03/13/2021   CREATININE 0.88 03/13/2021   BILITOT 0.6 03/13/2021   ALKPHOS 45 10/25/2020   AST 16 03/13/2021   ALT 11 03/13/2021   PROT 7.1 03/13/2021   ALBUMIN 4.2 10/25/2020   CALCIUM 9.9 03/13/2021   ANIONGAP 5 10/25/2020   EGFR 75 03/13/2021   Lab Results  Component Value Date   CHOL 192 03/13/2021   Lab Results  Component Value Date   HDL 64 03/13/2021   Lab Results  Component Value Date   LDLCALC 112 (H) 03/13/2021   Lab Results  Component Value Date   TRIG 69 03/13/2021   Lab Results  Component Value Date   CHOLHDL 3.0 03/13/2021   Lab Results  Component Value Date   HGBA1C 5.2 02/22/2020      Assessment & Plan:    Allergy desensitization therapy    Injection Note: Left arm was given 0.5 ml. Right arm was given 0.1 ml SQ as ordered. Tolerated procedure well. No adverse reaction noted. Pt refused to stay the thirty minutes to monitor. She has epi pen with her. Will notify if any problems   Follow-up: 1. Continue to monitor for s/s allergic reaction. Carry epi-pen at all times. Notify if any problems. RTC in 1 week.    Jerre Simon, NP

## 2021-06-10 ENCOUNTER — Ambulatory Visit: Payer: PRIVATE HEALTH INSURANCE | Admitting: Nurse Practitioner

## 2021-06-10 DIAGNOSIS — Z7689 Persons encountering health services in other specified circumstances: Secondary | ICD-10-CM

## 2021-06-10 NOTE — Progress Notes (Signed)
60 year old female presenting to [facility] for weekly allergy injection. Patient has appointment with allergist Friday   Denies a history of any reactions to injections in the past  Has been having injections for the past 5 years.   Arms cleaned with alcohol  Reviewed order with patient who presents with order, documentation history and serum.  #1 T-G-W expiration 02/14/2022 injected to left subcutaneous upper arm #2 M-DM-C-DG-CR-F expiration 02/14/2022 injected into right subcutaneous upper arm    Patient tolerated injections well. No observed reactions.   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     Return to clinic as needed

## 2021-06-19 ENCOUNTER — Other Ambulatory Visit: Payer: Self-pay

## 2021-06-19 ENCOUNTER — Encounter: Payer: Self-pay | Admitting: Family

## 2021-06-19 ENCOUNTER — Ambulatory Visit: Payer: PRIVATE HEALTH INSURANCE | Admitting: Family

## 2021-06-19 VITALS — BP 122/76

## 2021-06-19 DIAGNOSIS — Z516 Encounter for desensitization to allergens: Secondary | ICD-10-CM

## 2021-06-19 NOTE — Progress Notes (Signed)
Established Patient Office Visit  Subjective:  Patient ID: Susan Mason, female    DOB: Mar 05, 1961  Age: 60 y.o. MRN: [id number]  CC: History of allergic asthma   HPI [name] presents for allergy injections. She is feeling well today with no concerns. She tolerated her last injections well without any reaction.   Past Medical History:  Diagnosis Date   Abnormal Pap smear 2008   ASCUS   High blood pressure    Metrorrhagia    Osteopenia    Ovarian cyst    Yeast vaginitis     Past Surgical History:  Procedure Laterality Date   COLPOSCOPY  2008   neg bx    Family History  Problem Relation Age of Onset   Hyperlipidemia Mother    Hypertension Mother    Heart Problems Mother    Kidney disease Mother    Lung cancer Father    Emphysema Father    Breast cancer Neg Hx     Social History   Socioeconomic History   Marital status: Married    Spouse name: Not on file   Number of children: Not on file   Years of education: Not on file   Highest education level: Not on file  Occupational History   Not on file  Tobacco Use   Smoking status: Never   Smokeless tobacco: Never  Vaping Use   Vaping Use: Never used  Substance and Sexual Activity   Alcohol use: No   Drug use: No   Sexual activity: Not on file  Other Topics Concern   Not on file  Social History Narrative   Not on file   Social Determinants of Health   Financial Resource Strain: Not on file  Food Insecurity: Not on file  Transportation Needs: Not on file  Physical Activity: Not on file  Stress: Not on file  Social Connections: Not on file  Intimate Partner Violence: Not on file    Outpatient Medications Prior to Visit  Medication Sig Dispense Refill   cetirizine (ZYRTEC) 10 MG tablet Take 10 mg by mouth daily.      cholecalciferol (VITAMIN D) 1000 UNITS tablet Take 1,000 Units by mouth daily.     fexofenadine-pseudoephedrine (ALLEGRA-D 24) 180-240 MG 24 hr tablet Take 1 tablet by mouth daily.      lisinopril (ZESTRIL) 5 MG tablet TAKE ONE TABLET BY MOUTH DAILY 30 tablet 1   Multiple Vitamins-Minerals (MULTIVITAMIN WITH MINERALS) tablet Take 1 tablet by mouth daily.     Probiotic Product (PROBIOTIC-10 PO) Take 1 tablet by mouth daily.     No facility-administered medications prior to visit.    Allergies  Allergen Reactions   Penicillins    Doxycycline    Erythromycin    Erythromycin Base     ROS Review of Systems  Allergic/Immunologic: Positive for environmental allergies.  All other systems reviewed and are negative.    Objective:    Physical Exam Vitals reviewed.  Constitutional:      Appearance: Normal appearance. She is normal weight.  HENT:     Head: Normocephalic.  Eyes:     Pupils: Pupils are equal, round, and reactive to light.  Musculoskeletal:        General: Normal range of motion.  Skin:    General: Skin is warm and dry.  Neurological:     General: No focal deficit present.     Mental Status: She is alert.  Psychiatric:        Mood and Affect:  Mood normal.        Behavior: Behavior normal.        Thought Content: Thought content normal.    BP 122/76 (BP Location: Right Arm)  Wt Readings from Last 3 Encounters:  03/19/21 102 lb 12.8 oz (46.6 kg)  09/11/20 101 lb (45.8 kg)  03/06/20 102 lb 3.2 oz (46.4 kg)     Health Maintenance Due  Topic Date Due   Pneumococcal Vaccine 57-35 Years old (1 - PCV) Never done   HIV Screening  Never done   Zoster Vaccines- Shingrix (1 of 2) Never done   INFLUENZA VACCINE  03/03/2021   COVID-19 Vaccine (5 - Booster for Moderna series) 03/07/2021    There are no preventive care reminders to display for this patient.  Lab Results  Component Value Date   TSH 0.51 03/13/2021   Lab Results  Component Value Date   WBC 4.8 03/13/2021   HGB 13.2 03/13/2021   HCT 40.5 03/13/2021   MCV 92.9 03/13/2021   PLT 257 03/13/2021   Lab Results  Component Value Date   NA 137 03/13/2021   K 4.1 03/13/2021    CO2 27 03/13/2021   GLUCOSE 83 03/13/2021   BUN 19 03/13/2021   CREATININE 0.88 03/13/2021   BILITOT 0.6 03/13/2021   ALKPHOS 45 10/25/2020   AST 16 03/13/2021   ALT 11 03/13/2021   PROT 7.1 03/13/2021   ALBUMIN 4.2 10/25/2020   CALCIUM 9.9 03/13/2021   ANIONGAP 5 10/25/2020   EGFR 75 03/13/2021   Lab Results  Component Value Date   CHOL 192 03/13/2021   Lab Results  Component Value Date   HDL 64 03/13/2021   Lab Results  Component Value Date   LDLCALC 112 (H) 03/13/2021   Lab Results  Component Value Date   TRIG 69 03/13/2021   Lab Results  Component Value Date   CHOLHDL 3.0 03/13/2021   Lab Results  Component Value Date   HGBA1C 5.2 02/22/2020      Assessment & Plan:    Allergy desensitization therapy    Injection Note: Left arm was given 0.5 ml. Right arm was given 0.5 ml SQ as ordered. Tolerated procedure well. No adverse reaction noted. Pt refused to stay the thirty minutes to monitor. She has epi pen with her. Will notify if any problems   Follow-up: 1. Continue to monitor for s/s allergic reaction. Carry epi-pen at all times. Notify if any problems. RTC in 1 week.    Jerre Simon, NP

## 2021-07-03 ENCOUNTER — Other Ambulatory Visit: Payer: Self-pay

## 2021-07-03 ENCOUNTER — Encounter: Payer: Self-pay | Admitting: Family

## 2021-07-03 ENCOUNTER — Ambulatory Visit: Payer: PRIVATE HEALTH INSURANCE | Admitting: Family

## 2021-07-03 VITALS — BP 118/68

## 2021-07-03 DIAGNOSIS — Z7689 Persons encountering health services in other specified circumstances: Secondary | ICD-10-CM

## 2021-07-03 NOTE — Progress Notes (Signed)
Established Patient Office Visit  Subjective:  Patient ID: Susan Mason, female    DOB: Mar 05, 1961  Age: 60 y.o. MRN: [id number]  CC: History of allergic asthma   HPI [name] presents for allergy injections. She is feeling well today with no concerns. She tolerated her last injections well without any reaction.   Past Medical History:  Diagnosis Date   Abnormal Pap smear 2008   ASCUS   High blood pressure    Metrorrhagia    Osteopenia    Ovarian cyst    Yeast vaginitis     Past Surgical History:  Procedure Laterality Date   COLPOSCOPY  2008   neg bx    Family History  Problem Relation Age of Onset   Hyperlipidemia Mother    Hypertension Mother    Heart Problems Mother    Kidney disease Mother    Lung cancer Father    Emphysema Father    Breast cancer Neg Hx     Social History   Socioeconomic History   Marital status: Married    Spouse name: Not on file   Number of children: Not on file   Years of education: Not on file   Highest education level: Not on file  Occupational History   Not on file  Tobacco Use   Smoking status: Never   Smokeless tobacco: Never  Vaping Use   Vaping Use: Never used  Substance and Sexual Activity   Alcohol use: No   Drug use: No   Sexual activity: Not on file  Other Topics Concern   Not on file  Social History Narrative   Not on file   Social Determinants of Health   Financial Resource Strain: Not on file  Food Insecurity: Not on file  Transportation Needs: Not on file  Physical Activity: Not on file  Stress: Not on file  Social Connections: Not on file  Intimate Partner Violence: Not on file    Outpatient Medications Prior to Visit  Medication Sig Dispense Refill   cetirizine (ZYRTEC) 10 MG tablet Take 10 mg by mouth daily.      cholecalciferol (VITAMIN D) 1000 UNITS tablet Take 1,000 Units by mouth daily.     fexofenadine-pseudoephedrine (ALLEGRA-D 24) 180-240 MG 24 hr tablet Take 1 tablet by mouth daily.      lisinopril (ZESTRIL) 5 MG tablet TAKE ONE TABLET BY MOUTH DAILY 30 tablet 1   Multiple Vitamins-Minerals (MULTIVITAMIN WITH MINERALS) tablet Take 1 tablet by mouth daily.     Probiotic Product (PROBIOTIC-10 PO) Take 1 tablet by mouth daily.     No facility-administered medications prior to visit.    Allergies  Allergen Reactions   Penicillins    Doxycycline    Erythromycin    Erythromycin Base     ROS Review of Systems  Allergic/Immunologic: Positive for environmental allergies.  All other systems reviewed and are negative.    Objective:    Physical Exam Vitals reviewed.  Constitutional:      Appearance: Normal appearance. She is normal weight.  HENT:     Head: Normocephalic.  Eyes:     Pupils: Pupils are equal, round, and reactive to light.  Musculoskeletal:        General: Normal range of motion.  Skin:    General: Skin is warm and dry.  Neurological:     General: No focal deficit present.     Mental Status: She is alert.  Psychiatric:        Mood and Affect:  Mood normal.        Behavior: Behavior normal.        Thought Content: Thought content normal.    BP 118/68 (BP Location: Right Arm)  Wt Readings from Last 3 Encounters:  03/19/21 102 lb 12.8 oz (46.6 kg)  09/11/20 101 lb (45.8 kg)  03/06/20 102 lb 3.2 oz (46.4 kg)     Health Maintenance Due  Topic Date Due   Pneumococcal Vaccine 49-42 Years old (1 - PCV) Never done   HIV Screening  Never done   Zoster Vaccines- Shingrix (1 of 2) Never done   COVID-19 Vaccine (5 - Booster for Moderna series) 03/07/2021    There are no preventive care reminders to display for this patient.  Lab Results  Component Value Date   TSH 0.51 03/13/2021   Lab Results  Component Value Date   WBC 4.8 03/13/2021   HGB 13.2 03/13/2021   HCT 40.5 03/13/2021   MCV 92.9 03/13/2021   PLT 257 03/13/2021   Lab Results  Component Value Date   NA 137 03/13/2021   K 4.1 03/13/2021   CO2 27 03/13/2021   GLUCOSE 83  03/13/2021   BUN 19 03/13/2021   CREATININE 0.88 03/13/2021   BILITOT 0.6 03/13/2021   ALKPHOS 45 10/25/2020   AST 16 03/13/2021   ALT 11 03/13/2021   PROT 7.1 03/13/2021   ALBUMIN 4.2 10/25/2020   CALCIUM 9.9 03/13/2021   ANIONGAP 5 10/25/2020   EGFR 75 03/13/2021   Lab Results  Component Value Date   CHOL 192 03/13/2021   Lab Results  Component Value Date   HDL 64 03/13/2021   Lab Results  Component Value Date   LDLCALC 112 (H) 03/13/2021   Lab Results  Component Value Date   TRIG 69 03/13/2021   Lab Results  Component Value Date   CHOLHDL 3.0 03/13/2021   Lab Results  Component Value Date   HGBA1C 5.2 02/22/2020      Assessment & Plan:    Allergy desensitization therapy    Injection Note: Left arm was given 0.5 ml. Right arm was given 0.5 ml SQ as ordered. Tolerated procedure well. No adverse reaction noted. Pt refused to stay the thirty minutes to monitor. She has epi pen with her. Will notify if any problems   Follow-up: 1. Continue to monitor for s/s allergic reaction. Carry epi-pen at all times. Notify if any problems. RTC in 1 week.    Jerre Simon, NP

## 2021-07-07 ENCOUNTER — Other Ambulatory Visit: Payer: Self-pay | Admitting: Internal Medicine

## 2021-07-07 DIAGNOSIS — I10 Essential (primary) hypertension: Secondary | ICD-10-CM

## 2021-07-10 ENCOUNTER — Encounter: Payer: Self-pay | Admitting: Family

## 2021-07-10 ENCOUNTER — Ambulatory Visit: Payer: PRIVATE HEALTH INSURANCE | Admitting: Family

## 2021-07-10 ENCOUNTER — Other Ambulatory Visit: Payer: Self-pay

## 2021-07-10 DIAGNOSIS — Z516 Encounter for desensitization to allergens: Secondary | ICD-10-CM

## 2021-07-10 NOTE — Progress Notes (Signed)
Established Patient Office Visit  Subjective:  Patient ID: Susan Mason, female    DOB: Mar 05, 1961  Age: 60 y.o. MRN: [id number]  CC: History of allergic asthma   HPI [name] presents for allergy injections. She is feeling well today with no concerns. She tolerated her last injections well without any reaction.   Past Medical History:  Diagnosis Date   Abnormal Pap smear 2008   ASCUS   High blood pressure    Metrorrhagia    Osteopenia    Ovarian cyst    Yeast vaginitis     Past Surgical History:  Procedure Laterality Date   COLPOSCOPY  2008   neg bx    Family History  Problem Relation Age of Onset   Hyperlipidemia Mother    Hypertension Mother    Heart Problems Mother    Kidney disease Mother    Lung cancer Father    Emphysema Father    Breast cancer Neg Hx     Social History   Socioeconomic History   Marital status: Married    Spouse name: Not on file   Number of children: Not on file   Years of education: Not on file   Highest education level: Not on file  Occupational History   Not on file  Tobacco Use   Smoking status: Never   Smokeless tobacco: Never  Vaping Use   Vaping Use: Never used  Substance and Sexual Activity   Alcohol use: No   Drug use: No   Sexual activity: Not on file  Other Topics Concern   Not on file  Social History Narrative   Not on file   Social Determinants of Health   Financial Resource Strain: Not on file  Food Insecurity: Not on file  Transportation Needs: Not on file  Physical Activity: Not on file  Stress: Not on file  Social Connections: Not on file  Intimate Partner Violence: Not on file    Outpatient Medications Prior to Visit  Medication Sig Dispense Refill   cetirizine (ZYRTEC) 10 MG tablet Take 10 mg by mouth daily.      cholecalciferol (VITAMIN D) 1000 UNITS tablet Take 1,000 Units by mouth daily.     fexofenadine-pseudoephedrine (ALLEGRA-D 24) 180-240 MG 24 hr tablet Take 1 tablet by mouth daily.      lisinopril (ZESTRIL) 5 MG tablet TAKE ONE TABLET BY MOUTH DAILY 30 tablet 1   Multiple Vitamins-Minerals (MULTIVITAMIN WITH MINERALS) tablet Take 1 tablet by mouth daily.     Probiotic Product (PROBIOTIC-10 PO) Take 1 tablet by mouth daily.     No facility-administered medications prior to visit.    Allergies  Allergen Reactions   Penicillins    Doxycycline    Erythromycin    Erythromycin Base     ROS Review of Systems  Allergic/Immunologic: Positive for environmental allergies.  All other systems reviewed and are negative.    Objective:    Physical Exam Vitals reviewed.  Constitutional:      Appearance: Normal appearance. She is normal weight.  HENT:     Head: Normocephalic.  Eyes:     Pupils: Pupils are equal, round, and reactive to light.  Musculoskeletal:        General: Normal range of motion.  Skin:    General: Skin is warm and dry.  Neurological:     General: No focal deficit present.     Mental Status: She is alert.  Psychiatric:        Mood and Affect:  Mood normal.        Behavior: Behavior normal.        Thought Content: Thought content normal.    There were no vitals taken for this visit. Wt Readings from Last 3 Encounters:  03/19/21 102 lb 12.8 oz (46.6 kg)  09/11/20 101 lb (45.8 kg)  03/06/20 102 lb 3.2 oz (46.4 kg)     Health Maintenance Due  Topic Date Due   Pneumococcal Vaccine 29-59 Years old (1 - PCV) Never done   HIV Screening  Never done   Zoster Vaccines- Shingrix (1 of 2) Never done   COVID-19 Vaccine (5 - Booster for Moderna series) 03/07/2021    There are no preventive care reminders to display for this patient.  Lab Results  Component Value Date   TSH 0.51 03/13/2021   Lab Results  Component Value Date   WBC 4.8 03/13/2021   HGB 13.2 03/13/2021   HCT 40.5 03/13/2021   MCV 92.9 03/13/2021   PLT 257 03/13/2021   Lab Results  Component Value Date   NA 137 03/13/2021   K 4.1 03/13/2021   CO2 27 03/13/2021    GLUCOSE 83 03/13/2021   BUN 19 03/13/2021   CREATININE 0.88 03/13/2021   BILITOT 0.6 03/13/2021   ALKPHOS 45 10/25/2020   AST 16 03/13/2021   ALT 11 03/13/2021   PROT 7.1 03/13/2021   ALBUMIN 4.2 10/25/2020   CALCIUM 9.9 03/13/2021   ANIONGAP 5 10/25/2020   EGFR 75 03/13/2021   Lab Results  Component Value Date   CHOL 192 03/13/2021   Lab Results  Component Value Date   HDL 64 03/13/2021   Lab Results  Component Value Date   LDLCALC 112 (H) 03/13/2021   Lab Results  Component Value Date   TRIG 69 03/13/2021   Lab Results  Component Value Date   CHOLHDL 3.0 03/13/2021   Lab Results  Component Value Date   HGBA1C 5.2 02/22/2020      Assessment & Plan:    Allergy desensitization therapy    Injection Note: Left arm was given 0.5 ml. Right arm was given 0.5 ml SQ as ordered. Tolerated procedure well. No adverse reaction noted. Pt refused to stay the thirty minutes to monitor. She has epi pen with her. Will notify if any problems   Follow-up: 1. Continue to monitor for s/s allergic reaction. Carry epi-pen at all times. Notify if any problems. RTC in 1 week.    Jerre Simon, NP

## 2021-07-24 ENCOUNTER — Ambulatory Visit: Payer: PRIVATE HEALTH INSURANCE | Admitting: Family

## 2021-07-24 ENCOUNTER — Other Ambulatory Visit: Payer: Self-pay

## 2021-07-24 ENCOUNTER — Encounter: Payer: Self-pay | Admitting: Family

## 2021-07-24 DIAGNOSIS — Z516 Encounter for desensitization to allergens: Secondary | ICD-10-CM

## 2021-07-24 NOTE — Progress Notes (Signed)
°Established Patient Office Visit ° °Subjective:  °Patient ID: Susan Mason, female    DOB: 10/21/1960  Age: 60 y.o. MRN: [id number] ° °CC: History of allergic asthma ° ° °HPI °Susan Mason presents for allergy injections. She is feeling well today with no concerns. She tolerated her last injections well without any reaction.  ° °Past Medical History:  °Diagnosis Date  ° Abnormal Pap smear 2008  ° ASCUS  ° High blood pressure   ° Metrorrhagia   ° Osteopenia   ° Ovarian cyst   ° Yeast vaginitis   ° ° °Past Surgical History:  °Procedure Laterality Date  ° COLPOSCOPY  2008  ° neg bx  ° ° °Family History  °Problem Relation Age of Onset  ° Hyperlipidemia Mother   ° Hypertension Mother   ° Heart Problems Mother   ° Kidney disease Mother   ° Lung cancer Father   ° Emphysema Father   ° Breast cancer Neg Hx   ° ° °Social History  ° °Socioeconomic History  ° Marital status: Married  °  Spouse name: Not on file  ° Number of children: Not on file  ° Years of education: Not on file  ° Highest education level: Not on file  °Occupational History  ° Not on file  °Tobacco Use  ° Smoking status: Never  ° Smokeless tobacco: Never  °Vaping Use  ° Vaping Use: Never used  °Substance and Sexual Activity  ° Alcohol use: No  ° Drug use: No  ° Sexual activity: Not on file  °Other Topics Concern  ° Not on file  °Social History Narrative  ° Not on file  ° °Social Determinants of Health  ° °Financial Resource Strain: Not on file  °Food Insecurity: Not on file  °Transportation Needs: Not on file  °Physical Activity: Not on file  °Stress: Not on file  °Social Connections: Not on file  °Intimate Partner Violence: Not on file  ° ° °Outpatient Medications Prior to Visit  °Medication Sig Dispense Refill  ° cetirizine (ZYRTEC) 10 MG tablet Take 10 mg by mouth daily.     ° cholecalciferol (VITAMIN D) 1000 UNITS tablet Take 1,000 Units by mouth daily.    ° fexofenadine-pseudoephedrine (ALLEGRA-D 24) 180-240 MG 24 hr tablet Take 1 tablet by mouth daily.     ° lisinopril (ZESTRIL) 5 MG tablet TAKE ONE TABLET BY MOUTH DAILY 30 tablet 1  ° Multiple Vitamins-Minerals (MULTIVITAMIN WITH MINERALS) tablet Take 1 tablet by mouth daily.    ° Probiotic Product (PROBIOTIC-10 PO) Take 1 tablet by mouth daily.    ° °No facility-administered medications prior to visit.  ° ° °Allergies  °Allergen Reactions  ° Penicillins   ° Doxycycline   ° Erythromycin   ° Erythromycin Base   ° ° °ROS °Review of Systems  °Allergic/Immunologic: Positive for environmental allergies.  °All other systems reviewed and are negative. ° °  °Objective:  °  °Physical Exam °Vitals reviewed.  °Constitutional:   °   Appearance: Normal appearance. She is normal weight.  °HENT:  °   Head: Normocephalic.  °Eyes:  °   Pupils: Pupils are equal, round, and reactive to light.  °Musculoskeletal:     °   General: Normal range of motion.  °Skin: °   General: Skin is warm and dry.  °Neurological:  °   General: No focal deficit present.  °   Mental Status: She is alert.  °Psychiatric:     °   Mood and Affect:   Affect: Mood normal.        Behavior: Behavior normal.        Thought Content: Thought content normal.    There were no vitals taken for this visit. Wt Readings from Last 3 Encounters:  03/19/21 102 lb 12.8 oz (46.6 kg)  09/11/20 101 lb (45.8 kg)  03/06/20 102 lb 3.2 oz (46.4 kg)     Health Maintenance Due  Topic Date Due   Pneumococcal Vaccine 64-81 Years old (1 - PCV) Never done   HIV Screening  Never done   Zoster Vaccines- Shingrix (1 of 2) Never done   INFLUENZA VACCINE  03/03/2021   COVID-19 Vaccine (5 - Booster for Moderna series) 03/07/2021    There are no preventive care reminders to display for this patient.  Lab Results  Component Value Date   TSH 0.51 03/13/2021   Lab Results  Component Value Date   WBC 4.8 03/13/2021   HGB 13.2 03/13/2021   HCT 40.5 03/13/2021   MCV 92.9 03/13/2021   PLT 257 03/13/2021   Lab Results  Component Value Date   NA 137 03/13/2021   K 4.1  03/13/2021   CO2 27 03/13/2021   GLUCOSE 83 03/13/2021   BUN 19 03/13/2021   CREATININE 0.88 03/13/2021   BILITOT 0.6 03/13/2021   ALKPHOS 45 10/25/2020   AST 16 03/13/2021   ALT 11 03/13/2021   PROT 7.1 03/13/2021   ALBUMIN 4.2 10/25/2020   CALCIUM 9.9 03/13/2021   ANIONGAP 5 10/25/2020   EGFR 75 03/13/2021   Lab Results  Component Value Date   CHOL 192 03/13/2021   Lab Results  Component Value Date   HDL 64 03/13/2021   Lab Results  Component Value Date   LDLCALC 112 (H) 03/13/2021   Lab Results  Component Value Date   TRIG 69 03/13/2021   Lab Results  Component Value Date   CHOLHDL 3.0 03/13/2021   Lab Results  Component Value Date   HGBA1C 5.2 02/22/2020      Assessment & Plan:    Allergy desensitization therapy    Injection Note: Left arm was given 0.5 ml. Right arm was given 0.5 ml SQ as ordered. Tolerated procedure well. No adverse reaction noted. Pt refused to stay the thirty minutes to monitor. She has epi pen with her. Will notify if any problems   Follow-up: 1. Continue to monitor for s/s allergic reaction. Carry epi-pen at all times. Notify if any problems. RTC in 1 week.    Jerre Simon, NP

## 2021-07-31 ENCOUNTER — Ambulatory Visit: Payer: PRIVATE HEALTH INSURANCE | Admitting: Nurse Practitioner

## 2021-07-31 ENCOUNTER — Other Ambulatory Visit: Payer: Self-pay

## 2021-07-31 ENCOUNTER — Encounter: Payer: Self-pay | Admitting: Nurse Practitioner

## 2021-07-31 DIAGNOSIS — L659 Nonscarring hair loss, unspecified: Secondary | ICD-10-CM

## 2021-07-31 DIAGNOSIS — Z516 Encounter for desensitization to allergens: Secondary | ICD-10-CM

## 2021-07-31 DIAGNOSIS — L603 Nail dystrophy: Secondary | ICD-10-CM

## 2021-07-31 NOTE — Progress Notes (Signed)
Established Patient Office Visit  Subjective:  Patient ID: Susan Mason, female    DOB: 02/11/1961  Age: 60 y.o. MRN: [id number]  CC: History of allergic asthma   HPI [facility] presents for allergy injections. She is feeling well today with no concerns regarding her allergy shots. She tolerated her injections from last Thursday well without any reaction.   States she notices her hair becoming more fine, falling out more than usual, and brittle nails. She has looked into taking biotin and would like to discuss the supplement.  No changes in diet, soaps/shampoos, or bowel habits.   Past Medical History:  Diagnosis Date   Abnormal Pap smear 2008   ASCUS   High blood pressure    Metrorrhagia    Osteopenia    Ovarian cyst    Yeast vaginitis     Past Surgical History:  Procedure Laterality Date   COLPOSCOPY  2008   neg bx    Family History  Problem Relation Age of Onset   Hyperlipidemia Mother    Hypertension Mother    Heart Problems Mother    Kidney disease Mother    Lung cancer Father    Emphysema Father    Breast cancer Neg Hx     Social History   Socioeconomic History   Marital status: Married    Spouse name: Not on file   Number of children: Not on file   Years of education: Not on file   Highest education level: Not on file  Occupational History   Not on file  Tobacco Use   Smoking status: Never   Smokeless tobacco: Never  Vaping Use   Vaping Use: Never used  Substance and Sexual Activity   Alcohol use: No   Drug use: No   Sexual activity: Not on file  Other Topics Concern   Not on file  Social History Narrative   Lives at home with husband and mother    Social Determinants of Health   Financial Resource Strain: Not on file  Food Insecurity: Not on file  Transportation Needs: Not on file  Physical Activity: Not on file  Stress: Not on file  Social Connections: Not on file  Intimate Partner Violence: Not on file    Outpatient  Medications Prior to Visit  Medication Sig Dispense Refill   cetirizine (ZYRTEC) 10 MG tablet Take 10 mg by mouth daily.      cholecalciferol (VITAMIN D) 1000 UNITS tablet Take 1,000 Units by mouth daily.     fexofenadine-pseudoephedrine (ALLEGRA-D 24) 180-240 MG 24 hr tablet Take 1 tablet by mouth daily.     lisinopril (ZESTRIL) 5 MG tablet TAKE ONE TABLET BY MOUTH DAILY 30 tablet 1   Multiple Vitamins-Minerals (MULTIVITAMIN WITH MINERALS) tablet Take 1 tablet by mouth daily.     Probiotic Product (PROBIOTIC-10 PO) Take 1 tablet by mouth daily.     No facility-administered medications prior to visit.    Allergies  Allergen Reactions   Penicillins    Doxycycline    Erythromycin    Erythromycin Base     ROS Review of Systems  Constitutional:  Negative for fatigue.  Allergic/Immunologic: Positive for environmental allergies.  All other systems reviewed and are negative.    Objective:    Physical Exam Vitals reviewed.  Constitutional:      Appearance: Normal appearance. She is normal weight.  HENT:     Head: Normocephalic. Hair is normal.     Comments: No abnormal pattern/balding of hair  Eyes:  Pupils: Pupils are equal, round, and reactive to light.  Musculoskeletal:        General: Normal range of motion.  Skin:    General: Skin is warm and dry.     Nails: There is no clubbing.  Neurological:     General: No focal deficit present.     Mental Status: She is alert.  Psychiatric:        Mood and Affect: Mood normal.        Behavior: Behavior normal.        Thought Content: Thought content normal.    There were no vitals taken for this visit. Wt Readings from Last 3 Encounters:  03/19/21 102 lb 12.8 oz (46.6 kg)  09/11/20 101 lb (45.8 kg)  03/06/20 102 lb 3.2 oz (46.4 kg)     Health Maintenance Due  Topic Date Due   Pneumococcal Vaccine 76-17 Years old (1 - PCV) Never done   HIV Screening  Never done   Zoster Vaccines- Shingrix (1 of 2) Never done    COVID-19 Vaccine (5 - Booster for Moderna series) 03/07/2021    There are no preventive care reminders to display for this patient.  Lab Results  Component Value Date   TSH 0.51 03/13/2021   Lab Results  Component Value Date   WBC 4.8 03/13/2021   HGB 13.2 03/13/2021   HCT 40.5 03/13/2021   MCV 92.9 03/13/2021   PLT 257 03/13/2021   Lab Results  Component Value Date   NA 137 03/13/2021   K 4.1 03/13/2021   CO2 27 03/13/2021   GLUCOSE 83 03/13/2021   BUN 19 03/13/2021   CREATININE 0.88 03/13/2021   BILITOT 0.6 03/13/2021   ALKPHOS 45 10/25/2020   AST 16 03/13/2021   ALT 11 03/13/2021   PROT 7.1 03/13/2021   ALBUMIN 4.2 10/25/2020   CALCIUM 9.9 03/13/2021   ANIONGAP 5 10/25/2020   EGFR 75 03/13/2021   Lab Results  Component Value Date   CHOL 192 03/13/2021   Lab Results  Component Value Date   HDL 64 03/13/2021   Lab Results  Component Value Date   LDLCALC 112 (H) 03/13/2021   Lab Results  Component Value Date   TRIG 69 03/13/2021   Lab Results  Component Value Date   CHOLHDL 3.0 03/13/2021   Lab Results  Component Value Date   HGBA1C 5.2 02/22/2020      Assessment & Plan:    Allergy desensitization therapy    Injection Note: Left arm was given 0.5 ml as ordered. Right arm was given 0.5 ml SQ as ordered. Tolerated procedure well. No adverse reaction noted.  Pt refused to stay the thirty minutes to monitor. She has epi-pen with her. Will notify if any problems  Brittle Nails; Hair Thinning Advised pt the need to f/u with PCP to recheck TSH levels for their next appt.   Discussed benefits of biotin. Advised to take 30 mcg of Biotin OTC. Addressed pt's concerns of biotin interactions with current medication regimen.   Follow-up: 1. Continue to monitor for s/s allergic reaction. Carry epi-pen at all times. Notify if any problems.   Will follow up on effects from biotin in future appointments.   RTC in 1 week.    Drucilla Chalet, NP

## 2021-08-07 ENCOUNTER — Other Ambulatory Visit: Payer: Self-pay

## 2021-08-07 ENCOUNTER — Ambulatory Visit: Payer: PRIVATE HEALTH INSURANCE | Admitting: Nurse Practitioner

## 2021-08-07 ENCOUNTER — Encounter: Payer: Self-pay | Admitting: Family

## 2021-08-07 ENCOUNTER — Ambulatory Visit: Payer: PRIVATE HEALTH INSURANCE | Admitting: Family

## 2021-08-07 DIAGNOSIS — Z516 Encounter for desensitization to allergens: Secondary | ICD-10-CM

## 2021-08-07 NOTE — Progress Notes (Signed)
°Established Patient Office Visit ° °Subjective:  °Patient ID: Susan Mason, female    DOB: 04/18/1961  Age: 61 y.o. MRN: [id number] ° °CC: History of allergic asthma ° ° °HPI °Susan Mason presents for allergy injections. She is feeling well today with no concerns. She tolerated her last injections well without any reaction.  ° °Past Medical History:  °Diagnosis Date  ° Abnormal Pap smear 2008  ° ASCUS  ° High blood pressure   ° Metrorrhagia   ° Osteopenia   ° Ovarian cyst   ° Yeast vaginitis   ° ° °Past Surgical History:  °Procedure Laterality Date  ° COLPOSCOPY  2008  ° neg bx  ° ° °Family History  °Problem Relation Age of Onset  ° Hyperlipidemia Mother   ° Hypertension Mother   ° Heart Problems Mother   ° Kidney disease Mother   ° Lung cancer Father   ° Emphysema Father   ° Breast cancer Neg Hx   ° ° °Social History  ° °Socioeconomic History  ° Marital status: Married  °  Spouse name: Not on file  ° Number of children: Not on file  ° Years of education: Not on file  ° Highest education level: Not on file  °Occupational History  ° Not on file  °Tobacco Use  ° Smoking status: Never  ° Smokeless tobacco: Never  °Vaping Use  ° Vaping Use: Never used  °Substance and Sexual Activity  ° Alcohol use: No  ° Drug use: No  ° Sexual activity: Not on file  °Other Topics Concern  ° Not on file  °Social History Narrative  ° Not on file  ° °Social Determinants of Health  ° °Financial Resource Strain: Not on file  °Food Insecurity: Not on file  °Transportation Needs: Not on file  °Physical Activity: Not on file  °Stress: Not on file  °Social Connections: Not on file  °Intimate Partner Violence: Not on file  ° ° °Outpatient Medications Prior to Visit  °Medication Sig Dispense Refill  ° cetirizine (ZYRTEC) 10 MG tablet Take 10 mg by mouth daily.     ° cholecalciferol (VITAMIN D) 1000 UNITS tablet Take 1,000 Units by mouth daily.    ° fexofenadine-pseudoephedrine (ALLEGRA-D 24) 180-240 MG 24 hr tablet Take 1 tablet by mouth daily.     ° lisinopril (ZESTRIL) 5 MG tablet TAKE ONE TABLET BY MOUTH DAILY 30 tablet 1  ° Multiple Vitamins-Minerals (MULTIVITAMIN WITH MINERALS) tablet Take 1 tablet by mouth daily.    ° Probiotic Product (PROBIOTIC-10 PO) Take 1 tablet by mouth daily.    ° °No facility-administered medications prior to visit.  ° ° °Allergies  °Allergen Reactions  ° Penicillins   ° Doxycycline   ° Erythromycin   ° Erythromycin Base   ° ° °ROS °Review of Systems  °Allergic/Immunologic: Positive for environmental allergies.  °All other systems reviewed and are negative. ° °  °Objective:  °  °Physical Exam °Vitals reviewed.  °Constitutional:   °   Appearance: Normal appearance. She is normal weight.  °HENT:  °   Head: Normocephalic.  °Eyes:  °   Pupils: Pupils are equal, round, and reactive to light.  °Musculoskeletal:     °   General: Normal range of motion.  °Skin: °   General: Skin is warm and dry.  °Neurological:  °   General: No focal deficit present.  °   Mental Status: She is alert.  °Psychiatric:     °   Mood and Affect:   Affect: Mood normal.        Behavior: Behavior normal.        Thought Content: Thought content normal.    There were no vitals taken for this visit. Wt Readings from Last 3 Encounters:  03/19/21 102 lb 12.8 oz (46.6 kg)  09/11/20 101 lb (45.8 kg)  03/06/20 102 lb 3.2 oz (46.4 kg)     Health Maintenance Due  Topic Date Due   Pneumococcal Vaccine 64-81 Years old (1 - PCV) Never done   HIV Screening  Never done   Zoster Vaccines- Shingrix (1 of 2) Never done   INFLUENZA VACCINE  03/03/2021   COVID-19 Vaccine (5 - Booster for Moderna series) 03/07/2021    There are no preventive care reminders to display for this patient.  Lab Results  Component Value Date   TSH 0.51 03/13/2021   Lab Results  Component Value Date   WBC 4.8 03/13/2021   HGB 13.2 03/13/2021   HCT 40.5 03/13/2021   MCV 92.9 03/13/2021   PLT 257 03/13/2021   Lab Results  Component Value Date   NA 137 03/13/2021   K 4.1  03/13/2021   CO2 27 03/13/2021   GLUCOSE 83 03/13/2021   BUN 19 03/13/2021   CREATININE 0.88 03/13/2021   BILITOT 0.6 03/13/2021   ALKPHOS 45 10/25/2020   AST 16 03/13/2021   ALT 11 03/13/2021   PROT 7.1 03/13/2021   ALBUMIN 4.2 10/25/2020   CALCIUM 9.9 03/13/2021   ANIONGAP 5 10/25/2020   EGFR 75 03/13/2021   Lab Results  Component Value Date   CHOL 192 03/13/2021   Lab Results  Component Value Date   HDL 64 03/13/2021   Lab Results  Component Value Date   LDLCALC 112 (H) 03/13/2021   Lab Results  Component Value Date   TRIG 69 03/13/2021   Lab Results  Component Value Date   CHOLHDL 3.0 03/13/2021   Lab Results  Component Value Date   HGBA1C 5.2 02/22/2020      Assessment & Plan:    Allergy desensitization therapy    Injection Note: Left arm was given 0.5 ml. Right arm was given 0.5 ml SQ as ordered. Tolerated procedure well. No adverse reaction noted. Pt refused to stay the thirty minutes to monitor. She has epi pen with her. Will notify if any problems   Follow-up: 1. Continue to monitor for s/s allergic reaction. Carry epi-pen at all times. Notify if any problems. RTC in 1 week.    Jerre Simon, NP

## 2021-08-12 ENCOUNTER — Ambulatory Visit: Payer: PRIVATE HEALTH INSURANCE | Admitting: Nurse Practitioner

## 2021-08-21 ENCOUNTER — Other Ambulatory Visit: Payer: Self-pay | Admitting: Nurse Practitioner

## 2021-08-21 ENCOUNTER — Ambulatory Visit: Payer: Self-pay | Admitting: Nurse Practitioner

## 2021-08-21 ENCOUNTER — Other Ambulatory Visit: Payer: Self-pay

## 2021-08-21 VITALS — BP 122/74 | HR 65 | Ht 61.0 in | Wt 105.0 lb

## 2021-08-21 DIAGNOSIS — L603 Nail dystrophy: Secondary | ICD-10-CM

## 2021-08-21 DIAGNOSIS — R5383 Other fatigue: Secondary | ICD-10-CM

## 2021-08-21 DIAGNOSIS — L659 Nonscarring hair loss, unspecified: Secondary | ICD-10-CM

## 2021-08-21 DIAGNOSIS — Z516 Encounter for desensitization to allergens: Secondary | ICD-10-CM

## 2021-08-21 LAB — CBC WITH DIFFERENTIAL/PLATELET
Absolute Monocytes: 376 cells/uL (ref 200–950)
Basophils Absolute: 19 cells/uL (ref 0–200)
Basophils Relative: 0.4 %
Eosinophils Absolute: 80 cells/uL (ref 15–500)
Eosinophils Relative: 1.7 %
HCT: 37.2 % (ref 35.0–45.0)
Hemoglobin: 12.5 g/dL (ref 11.7–15.5)
Lymphs Abs: 1466 cells/uL (ref 850–3900)
MCH: 30.3 pg (ref 27.0–33.0)
MCHC: 33.6 g/dL (ref 32.0–36.0)
MCV: 90.3 fL (ref 80.0–100.0)
MPV: 8.7 fL (ref 7.5–12.5)
Monocytes Relative: 8 %
Neutro Abs: 2759 cells/uL (ref 1500–7800)
Neutrophils Relative %: 58.7 %
Platelets: 270 10*3/uL (ref 140–400)
RBC: 4.12 10*6/uL (ref 3.80–5.10)
RDW: 12.6 % (ref 11.0–15.0)
Total Lymphocyte: 31.2 %
WBC: 4.7 10*3/uL (ref 3.8–10.8)

## 2021-08-21 LAB — TSH: TSH: 0.58 mIU/L (ref 0.40–4.50)

## 2021-08-21 NOTE — Addendum Note (Signed)
Addended by: Terie Purser on: 08/21/2021 10:05 AM   Modules accepted: Orders

## 2021-08-21 NOTE — Progress Notes (Signed)
Established Patient Office Visit  Subjective:  Patient ID: Susan Mason, female    DOB: 16-Nov-1960  Age: 61 y.o. MRN: [id number]  CC: History of allergic asthma   HPI [facility] presents for allergy injections. She is feeling well today with no concerns regarding her allergy shots. She did not have her shots last week. Denies any new symptoms r/t allergies.  07/31/2021: States she notices her hair becoming more fine, falling out more than usual, and brittle nails. She has looked into taking biotin and would like to discuss the supplement. 08/21/21 Pt. Still concerned of ongoing hair changes and fatigue. Started biotin on 08/04/2021, found a multivitamin that contains 30 mcg. Tolerating well.  Denies heart palpitations, headaches, or chest pain.   No changes in diet, soaps/shampoos, or bowel habits.   Past Medical History:  Diagnosis Date   Abnormal Pap smear 2008   ASCUS   High blood pressure    Metrorrhagia    Osteopenia    Ovarian cyst    Yeast vaginitis     Past Surgical History:  Procedure Laterality Date   COLPOSCOPY  2008   neg bx    Family History  Problem Relation Age of Onset   Hyperlipidemia Mother    Hypertension Mother    Heart Problems Mother    Kidney disease Mother    Lung cancer Father    Emphysema Father    Breast cancer Neg Hx     Social History   Socioeconomic History   Marital status: Married    Spouse name: Not on file   Number of children: Not on file   Years of education: Not on file   Highest education level: Not on file  Occupational History   Not on file  Tobacco Use   Smoking status: Never   Smokeless tobacco: Never  Vaping Use   Vaping Use: Never used  Substance and Sexual Activity   Alcohol use: No   Drug use: No   Sexual activity: Not on file  Other Topics Concern   Not on file  Social History Narrative   Lives at home with husband and mother    Social Determinants of Health   Financial Resource Strain: Not on  file  Food Insecurity: Not on file  Transportation Needs: Not on file  Physical Activity: Not on file  Stress: Not on file  Social Connections: Not on file  Intimate Partner Violence: Not on file    Outpatient Medications Prior to Visit  Medication Sig Dispense Refill   cetirizine (ZYRTEC) 10 MG tablet Take 10 mg by mouth daily.      cholecalciferol (VITAMIN D) 1000 UNITS tablet Take 1,000 Units by mouth daily.     fexofenadine-pseudoephedrine (ALLEGRA-D 24) 180-240 MG 24 hr tablet Take 1 tablet by mouth daily.     lisinopril (ZESTRIL) 5 MG tablet TAKE ONE TABLET BY MOUTH DAILY 30 tablet 1   Multiple Vitamins-Minerals (MULTIVITAMIN WITH MINERALS) tablet Take 1 tablet by mouth daily.     Probiotic Product (PROBIOTIC-10 PO) Take 1 tablet by mouth daily.     No facility-administered medications prior to visit.    Allergies  Allergen Reactions   Penicillins    Doxycycline    Erythromycin    Erythromycin Base     ROS Review of Systems  Constitutional:  Positive for fatigue. Negative for activity change, appetite change, diaphoresis, fever and unexpected weight change.  HENT:  Negative for congestion, ear discharge, sinus pressure and sinus pain.  Eyes:  Negative for discharge, redness, itching and visual disturbance.  Respiratory:  Negative for chest tightness, shortness of breath and wheezing.   Cardiovascular:  Negative for chest pain, palpitations and leg swelling.  Gastrointestinal:  Negative for constipation, diarrhea, nausea and vomiting.  Endocrine: Positive for cold intolerance. Negative for heat intolerance.  Musculoskeletal:  Negative for arthralgias and back pain.  Allergic/Immunologic: Positive for environmental allergies.  Neurological:  Negative for dizziness, weakness, numbness and headaches.  Psychiatric/Behavioral:  Negative for sleep disturbance.   All other systems reviewed and are negative.    Objective:    Physical Exam Vitals reviewed.   Constitutional:      Appearance: Normal appearance. She is normal weight.  HENT:     Head: Normocephalic. Hair is normal.     Comments: No abnormal pattern/balding of hair     Right Ear: Tympanic membrane normal.     Left Ear: Tympanic membrane normal.     Nose: Nose normal.     Mouth/Throat:     Mouth: Mucous membranes are moist.     Pharynx: Oropharynx is clear.  Eyes:     Pupils: Pupils are equal, round, and reactive to light.  Neck:     Thyroid: No thyroid mass or thyroid tenderness.     Vascular: No carotid bruit.  Cardiovascular:     Rate and Rhythm: Normal rate and regular rhythm.     Pulses: Normal pulses.          Radial pulses are 2+ on the right side and 2+ on the left side.     Heart sounds: Normal heart sounds.  Pulmonary:     Effort: Pulmonary effort is normal.     Breath sounds: Normal breath sounds.  Abdominal:     General: Abdomen is flat.     Palpations: Abdomen is soft.  Musculoskeletal:        General: Normal range of motion.     Cervical back: Normal range of motion.  Lymphadenopathy:     Cervical: No cervical adenopathy.  Skin:    General: Skin is warm and dry.     Nails: There is no clubbing.  Neurological:     General: No focal deficit present.     Mental Status: She is alert.  Psychiatric:        Mood and Affect: Mood normal.        Behavior: Behavior normal.        Thought Content: Thought content normal.    BP 122/74 (BP Location: Left Arm, Patient Position: Sitting, Cuff Size: Normal)    Pulse 65    Ht _0  (1.549 m)    Wt 105 lb (47.6 kg)    BMI 19.84 kg/m  Wt Readings from Last 3 Encounters:  08/21/21 105 lb (47.6 kg)  03/19/21 102 lb 12.8 oz (46.6 kg)  09/11/20 101 lb (45.8 kg)     Health Maintenance Due  Topic Date Due   Pneumococcal Vaccine 66-71 Years old (1 - PCV) Never done   HIV Screening  Never done   Zoster Vaccines- Shingrix (1 of 2) Never done   INFLUENZA VACCINE  03/03/2021   COVID-19 Vaccine (5 - Booster for  Moderna series) 03/07/2021    There are no preventive care reminders to display for this patient.  Lab Results  Component Value Date   TSH 0.51 03/13/2021   Lab Results  Component Value Date   WBC 4.8 03/13/2021   HGB 13.2 03/13/2021   HCT  40.5 03/13/2021   MCV 92.9 03/13/2021   PLT 257 03/13/2021   Lab Results  Component Value Date   NA 137 03/13/2021   K 4.1 03/13/2021   CO2 27 03/13/2021   GLUCOSE 83 03/13/2021   BUN 19 03/13/2021   CREATININE 0.88 03/13/2021   BILITOT 0.6 03/13/2021   ALKPHOS 45 10/25/2020   AST 16 03/13/2021   ALT 11 03/13/2021   PROT 7.1 03/13/2021   ALBUMIN 4.2 10/25/2020   CALCIUM 9.9 03/13/2021   ANIONGAP 5 10/25/2020   EGFR 75 03/13/2021   Lab Results  Component Value Date   CHOL 192 03/13/2021   Lab Results  Component Value Date   HDL 64 03/13/2021   Lab Results  Component Value Date   LDLCALC 112 (H) 03/13/2021   Lab Results  Component Value Date   TRIG 69 03/13/2021   Lab Results  Component Value Date   CHOLHDL 3.0 03/13/2021   Lab Results  Component Value Date   HGBA1C 5.2 02/22/2020      Assessment & Plan:    Allergy desensitization therapy    Injection Note: Left arm was given 0.5 ml as ordered. Right arm was given 0.5 ml SQ as ordered. Tolerated procedure well. No adverse reaction noted.  Pt refused to stay the thirty minutes to monitor. She has epi-pen with her. Will notify if any problems  Brittle Nails; Hair Thinning; Fatigue  Ongoing.  Continue taking multivitamin regimen. TSH and CBC drawn today.    Follow-up: 1. Continue to monitor for s/s allergic reaction. Carry epi-pen at all times. Notify if any problems. \ RTC in 1 week for allergy serum injections.   Will call pt. On 1/24 to f/u and discuss lab values.    Drucilla Chalet, NP

## 2021-08-26 ENCOUNTER — Telehealth: Payer: Self-pay | Admitting: Nurse Practitioner

## 2021-08-26 ENCOUNTER — Ambulatory Visit: Payer: Self-pay | Admitting: Nurse Practitioner

## 2021-08-26 NOTE — Telephone Encounter (Signed)
Error in charting.

## 2021-08-26 NOTE — Telephone Encounter (Signed)
Phone call f/u with patient about TSH and CBC w/ diff lab results from last week. PCP made pt. Aware of results last week. No additional questions or concerns since then.

## 2021-08-28 ENCOUNTER — Other Ambulatory Visit: Payer: Self-pay

## 2021-08-28 ENCOUNTER — Encounter: Payer: Self-pay | Admitting: Nurse Practitioner

## 2021-08-28 ENCOUNTER — Ambulatory Visit: Payer: Self-pay | Admitting: Nurse Practitioner

## 2021-08-28 DIAGNOSIS — Z516 Encounter for desensitization to allergens: Secondary | ICD-10-CM

## 2021-08-28 NOTE — Progress Notes (Signed)
Established Patient Office Visit  Subjective:  Patient ID: Susan Mason, female    DOB: 1961/03/19  Age: 61 y.o. MRN: [id number]  CC: History of allergic asthma   HPI Susan Mason presents for allergy injections. She is feeling well today with no concerns regarding her allergy shots. She is waiting on the serum for her left arm to arrive, so she is only getting her right arm injection today. Denies any new symptoms r/t allergies.  Past Medical History:  Diagnosis Date   Abnormal Pap smear 2008   ASCUS   High blood pressure    Metrorrhagia    Osteopenia    Ovarian cyst    Yeast vaginitis     Past Surgical History:  Procedure Laterality Date   COLPOSCOPY  2008   neg bx    Family History  Problem Relation Age of Onset   Hyperlipidemia Mother    Hypertension Mother    Heart Problems Mother    Kidney disease Mother    Lung cancer Father    Emphysema Father    Breast cancer Neg Hx     Social History   Socioeconomic History   Marital status: Married    Spouse name: Not on file   Number of children: Not on file   Years of education: Not on file   Highest education level: Not on file  Occupational History   Not on file  Tobacco Use   Smoking status: Never   Smokeless tobacco: Never  Vaping Use   Vaping Use: Never used  Substance and Sexual Activity   Alcohol use: No   Drug use: No   Sexual activity: Not on file  Other Topics Concern   Not on file  Social History Narrative   Lives at home with husband and mother    Social Determinants of Health   Financial Resource Strain: Not on file  Food Insecurity: Not on file  Transportation Needs: Not on file  Physical Activity: Not on file  Stress: Not on file  Social Connections: Not on file  Intimate Partner Violence: Not on file    Outpatient Medications Prior to Visit  Medication Sig Dispense Refill   cetirizine (ZYRTEC) 10 MG tablet Take 10 mg by mouth daily.      cholecalciferol (VITAMIN D) 1000 UNITS  tablet Take 1,000 Units by mouth daily.     fexofenadine-pseudoephedrine (ALLEGRA-D 24) 180-240 MG 24 hr tablet Take 1 tablet by mouth daily.     lisinopril (ZESTRIL) 5 MG tablet TAKE ONE TABLET BY MOUTH DAILY 30 tablet 1   Multiple Vitamins-Minerals (MULTIVITAMIN WITH MINERALS) tablet Take 1 tablet by mouth daily.     Probiotic Product (PROBIOTIC-10 PO) Take 1 tablet by mouth daily.     No facility-administered medications prior to visit.    Allergies  Allergen Reactions   Clindamycin Rash   Nsaids Other (See Comments)   Penicillins    Doxycycline    Doxycycline Calcium Hives   Erythromycin    Erythromycin Base     ROS Review of Systems  HENT:  Negative for congestion, ear discharge, sinus pressure and sinus pain.   Eyes:  Negative for discharge, redness and itching.  Respiratory:  Negative for shortness of breath and wheezing.   Cardiovascular:  Negative for chest pain.  Allergic/Immunologic: Positive for environmental allergies.  Neurological:  Negative for dizziness and headaches.  All other systems reviewed and are negative.    Objective:    Physical Exam Vitals reviewed.  Constitutional:  Appearance: Normal appearance. She is normal weight.  HENT:     Head: Normocephalic. Hair is normal.     Comments: No abnormal pattern/balding of hair  Neck:     Thyroid: No thyroid mass or thyroid tenderness.  Cardiovascular:     Pulses:          Radial pulses are 2+ on the right side and 2+ on the left side.  Pulmonary:     Effort: Pulmonary effort is normal.  Skin:    General: Skin is warm and dry.     Nails: There is no clubbing.  Neurological:     Mental Status: She is alert and oriented to person, place, and time. Mental status is at baseline.  Psychiatric:        Mood and Affect: Mood normal.        Behavior: Behavior normal.        Thought Content: Thought content normal.    There were no vitals taken for this visit. Wt Readings from Last 3 Encounters:   08/21/21 105 lb (47.6 kg)  03/19/21 102 lb 12.8 oz (46.6 kg)  09/11/20 101 lb (45.8 kg)     Health Maintenance Due  Topic Date Due   HIV Screening  Never done   Zoster Vaccines- Shingrix (1 of 2) Never done   COVID-19 Vaccine (5 - Booster for Moderna series) 03/07/2021    There are no preventive care reminders to display for this patient.  Lab Results  Component Value Date   TSH 0.58 08/21/2021   Lab Results  Component Value Date   WBC 4.7 08/21/2021   HGB 12.5 08/21/2021   HCT 37.2 08/21/2021   MCV 90.3 08/21/2021   PLT 270 08/21/2021   Lab Results  Component Value Date   NA 137 03/13/2021   K 4.1 03/13/2021   CO2 27 03/13/2021   GLUCOSE 83 03/13/2021   BUN 19 03/13/2021   CREATININE 0.88 03/13/2021   BILITOT 0.6 03/13/2021   ALKPHOS 45 10/25/2020   AST 16 03/13/2021   ALT 11 03/13/2021   PROT 7.1 03/13/2021   ALBUMIN 4.2 10/25/2020   CALCIUM 9.9 03/13/2021   ANIONGAP 5 10/25/2020   EGFR 75 03/13/2021   Lab Results  Component Value Date   CHOL 192 03/13/2021   Lab Results  Component Value Date   HDL 64 03/13/2021   Lab Results  Component Value Date   LDLCALC 112 (H) 03/13/2021   Lab Results  Component Value Date   TRIG 69 03/13/2021   Lab Results  Component Value Date   CHOLHDL 3.0 03/13/2021   Lab Results  Component Value Date   HGBA1C 5.2 02/22/2020      Assessment & Plan:    Allergy desensitization therapy    Injection Note: Left arm not given today. Pt. Waiting on serum to arrive. Right arm was given 0.5 ml SQ as ordered. Tolerated procedure well. No adverse reaction noted.  Pt declined to stay the thirty minutes to monitor. She has epi-pen with her. Will notify if any problems  Follow-up:  1. Continue to monitor for s/s allergic reaction. Carry epi-pen at all times. Notify if any problems. RTC in 1 week for allergy serum injections.   Drucilla Chalet, NP

## 2021-09-04 ENCOUNTER — Ambulatory Visit: Payer: Self-pay | Admitting: Nurse Practitioner

## 2021-09-04 ENCOUNTER — Other Ambulatory Visit: Payer: Self-pay

## 2021-09-04 DIAGNOSIS — Z516 Encounter for desensitization to allergens: Secondary | ICD-10-CM

## 2021-09-04 NOTE — Progress Notes (Signed)
Established Patient Office Visit  Subjective:  Patient ID: Susan Mason, female    DOB: 1960/10/16  Age: 61 y.o. MRN: [id number]  CC: History of allergic asthma   HPI [facility] presents for allergy injections. She is feeling well today with no concerns regarding her allergy shots. She is waiting on the serum for her left arm to arrive, so she is only getting her right arm injection today. Serum is to be mailed to her house. She informs me that she will not be here next week for her injections.  Denies any new symptoms r/t allergies.  Past Medical History:  Diagnosis Date   Abnormal Pap smear 2008   ASCUS   High blood pressure    Metrorrhagia    Osteopenia    Ovarian cyst    Yeast vaginitis     Past Surgical History:  Procedure Laterality Date   COLPOSCOPY  2008   neg bx    Family History  Problem Relation Age of Onset   Hyperlipidemia Mother    Hypertension Mother    Heart Problems Mother    Kidney disease Mother    Lung cancer Father    Emphysema Father    Breast cancer Neg Hx     Social History   Socioeconomic History   Marital status: Married    Spouse name: Not on file   Number of children: Not on file   Years of education: Not on file   Highest education level: Not on file  Occupational History   Not on file  Tobacco Use   Smoking status: Never   Smokeless tobacco: Never  Vaping Use   Vaping Use: Never used  Substance and Sexual Activity   Alcohol use: No   Drug use: No   Sexual activity: Not on file  Other Topics Concern   Not on file  Social History Narrative   Lives at home with husband and mother    Social Determinants of Health   Financial Resource Strain: Not on file  Food Insecurity: Not on file  Transportation Needs: Not on file  Physical Activity: Not on file  Stress: Not on file  Social Connections: Not on file  Intimate Partner Violence: Not on file    Outpatient Medications Prior to Visit  Medication Sig Dispense  Refill   cetirizine (ZYRTEC) 10 MG tablet Take 10 mg by mouth daily.      cholecalciferol (VITAMIN D) 1000 UNITS tablet Take 1,000 Units by mouth daily.     fexofenadine-pseudoephedrine (ALLEGRA-D 24) 180-240 MG 24 hr tablet Take 1 tablet by mouth daily.     lisinopril (ZESTRIL) 5 MG tablet TAKE ONE TABLET BY MOUTH DAILY 30 tablet 1   Multiple Vitamins-Minerals (MULTIVITAMIN WITH MINERALS) tablet Take 1 tablet by mouth daily.     Probiotic Product (PROBIOTIC-10 PO) Take 1 tablet by mouth daily.     No facility-administered medications prior to visit.    Allergies  Allergen Reactions   Clindamycin Rash   Nsaids Other (See Comments)   Penicillins    Doxycycline    Doxycycline Calcium Hives   Erythromycin    Erythromycin Base     ROS Review of Systems  HENT:  Negative for congestion, ear discharge, sinus pressure and sinus pain.   Eyes:  Negative for discharge, redness and itching.  Respiratory:  Negative for shortness of breath and wheezing.   Cardiovascular:  Negative for chest pain.  Allergic/Immunologic: Positive for environmental allergies.  Neurological:  Negative for dizziness and  headaches.  All other systems reviewed and are negative.    Objective:    Physical Exam Vitals reviewed.  Constitutional:      Appearance: Normal appearance. She is normal weight.  HENT:     Head: Normocephalic.  Pulmonary:     Effort: Pulmonary effort is normal.  Skin:    General: Skin is warm and dry.  Neurological:     Mental Status: She is alert and oriented to person, place, and time. Mental status is at baseline.  Psychiatric:        Mood and Affect: Mood normal.        Behavior: Behavior normal.        Thought Content: Thought content normal.    There were no vitals taken for this visit. Wt Readings from Last 3 Encounters:  08/21/21 105 lb (47.6 kg)  03/19/21 102 lb 12.8 oz (46.6 kg)  09/11/20 101 lb (45.8 kg)     Health Maintenance Due  Topic Date Due   HIV  Screening  Never done   Zoster Vaccines- Shingrix (1 of 2) Never done   INFLUENZA VACCINE  03/03/2021   COVID-19 Vaccine (5 - Booster for Moderna series) 03/07/2021    There are no preventive care reminders to display for this patient.  Lab Results  Component Value Date   TSH 0.58 08/21/2021   Lab Results  Component Value Date   WBC 4.7 08/21/2021   HGB 12.5 08/21/2021   HCT 37.2 08/21/2021   MCV 90.3 08/21/2021   PLT 270 08/21/2021   Lab Results  Component Value Date   NA 137 03/13/2021   K 4.1 03/13/2021   CO2 27 03/13/2021   GLUCOSE 83 03/13/2021   BUN 19 03/13/2021   CREATININE 0.88 03/13/2021   BILITOT 0.6 03/13/2021   ALKPHOS 45 10/25/2020   AST 16 03/13/2021   ALT 11 03/13/2021   PROT 7.1 03/13/2021   ALBUMIN 4.2 10/25/2020   CALCIUM 9.9 03/13/2021   ANIONGAP 5 10/25/2020   EGFR 75 03/13/2021   Lab Results  Component Value Date   CHOL 192 03/13/2021   Lab Results  Component Value Date   HDL 64 03/13/2021   Lab Results  Component Value Date   LDLCALC 112 (H) 03/13/2021   Lab Results  Component Value Date   TRIG 69 03/13/2021   Lab Results  Component Value Date   CHOLHDL 3.0 03/13/2021   Lab Results  Component Value Date   HGBA1C 5.2 02/22/2020      Assessment & Plan:    Allergy desensitization therapy    Injection Note: Left arm not given today. Pt. Waiting on serum to arrive.  Right arm was given 0.5 ml SQ as ordered. Tolerated procedure well. No adverse reaction noted.  Pt declined to stay the thirty minutes to monitor. She has epi-pen with her. Will notify if any problems  Follow-up:  1. Continue to monitor for s/s allergic reaction. Carry epi-pen at all times. Notify if any problems. Continue taking PO allergy regimen.  RTC in 2 weeks for allergy serum injections.   Drucilla Chalet, NP

## 2021-09-06 ENCOUNTER — Other Ambulatory Visit: Payer: Self-pay | Admitting: Internal Medicine

## 2021-09-06 DIAGNOSIS — I10 Essential (primary) hypertension: Secondary | ICD-10-CM

## 2021-09-09 LAB — CBC: RBC: 4.56 (ref 3.87–5.11)

## 2021-09-09 LAB — BASIC METABOLIC PANEL
BUN: 11 (ref 4–21)
CO2: 22 (ref 13–22)
Chloride: 102 (ref 99–108)
Creatinine: 0.8 (ref 0.5–1.1)
Glucose: 89
Potassium: 4.6 (ref 3.4–5.3)
Sodium: 142 (ref 137–147)

## 2021-09-09 LAB — CBC AND DIFFERENTIAL
HCT: 40 (ref 36–46)
Hemoglobin: 13.8 (ref 12.0–16.0)
Platelets: 297 (ref 150–399)
WBC: 5.7

## 2021-09-09 LAB — HEPATIC FUNCTION PANEL
ALT: 12 (ref 7–35)
AST: 15 (ref 13–35)
Alkaline Phosphatase: 63 (ref 25–125)
Bilirubin, Direct: 0.1 (ref 0.01–0.4)
Bilirubin, Total: 0.3

## 2021-09-09 LAB — COMPREHENSIVE METABOLIC PANEL
Albumin: 5.1 — AB (ref 3.5–5.0)
Calcium: 10.1 (ref 8.7–10.7)

## 2021-09-10 ENCOUNTER — Encounter: Payer: Self-pay | Admitting: Internal Medicine

## 2021-09-13 ENCOUNTER — Other Ambulatory Visit: Payer: Self-pay | Admitting: Gastroenterology

## 2021-09-13 DIAGNOSIS — R1011 Right upper quadrant pain: Secondary | ICD-10-CM

## 2021-09-13 DIAGNOSIS — R110 Nausea: Secondary | ICD-10-CM

## 2021-09-17 LAB — HM PAP SMEAR: HPV, high-risk: NEGATIVE

## 2021-09-18 ENCOUNTER — Other Ambulatory Visit: Payer: Self-pay

## 2021-09-18 ENCOUNTER — Ambulatory Visit: Payer: Self-pay | Admitting: Nurse Practitioner

## 2021-09-18 DIAGNOSIS — Z516 Encounter for desensitization to allergens: Secondary | ICD-10-CM

## 2021-09-18 MED ORDER — AZELASTINE HCL 0.05 % OP SOLN: 1.0000 [drp] | Freq: Every day | OPHTHALMIC | 2 refills | Status: DC

## 2021-09-18 NOTE — Progress Notes (Signed)
Established Patient Office Visit  Subjective:  Patient ID: Susan Mason, female    DOB: [birth year]  Age: 61 y.o. MRN: [id number]  CC: History of allergic asthma   HPI [facility] presents for allergy injections. She is feeling well today with no concerns regarding her allergy shots. She is starting new serums today and will be following new dosage protocol per order from allergist.  Denies any new symptoms r/t allergies.   Needs refill on eye drops  Past Medical History:  Diagnosis Date   Abnormal Pap smear 2008   ASCUS   High blood pressure    Metrorrhagia    Osteopenia    Ovarian cyst    Yeast vaginitis     Past Surgical History:  Procedure Laterality Date   COLPOSCOPY  2008   neg bx    Family History  Problem Relation Age of Onset   Hyperlipidemia Mother    Hypertension Mother    Heart Problems Mother    Kidney disease Mother    Lung cancer Father    Emphysema Father    Breast cancer Neg Hx     Social History   Socioeconomic History   Marital status: Married    Spouse name: Not on file   Number of children: Not on file   Years of education: Not on file   Highest education level: Not on file  Occupational History   Not on file  Tobacco Use   Smoking status: Never   Smokeless tobacco: Never  Vaping Use   Vaping Use: Never used  Substance and Sexual Activity   Alcohol use: No   Drug use: No   Sexual activity: Not on file  Other Topics Concern   Not on file  Social History Narrative   Lives at home with husband and mother    Social Determinants of Health   Financial Resource Strain: Not on file  Food Insecurity: Not on file  Transportation Needs: Not on file  Physical Activity: Not on file  Stress: Not on file  Social Connections: Not on file  Intimate Partner Violence: Not on file    Outpatient Medications Prior to Visit  Medication Sig Dispense Refill   azelastine (OPTIVAR) 0.05 % ophthalmic solution Apply 1 drop to eye daily.      cetirizine (ZYRTEC) 10 MG tablet Take 10 mg by mouth daily.      cholecalciferol (VITAMIN D) 1000 UNITS tablet Take 1,000 Units by mouth daily.     fexofenadine-pseudoephedrine (ALLEGRA-D 24) 180-240 MG 24 hr tablet Take 1 tablet by mouth daily.     lisinopril (ZESTRIL) 5 MG tablet TAKE ONE TABLET BY MOUTH DAILY 30 tablet 1   Multiple Vitamins-Minerals (MULTIVITAMIN WITH MINERALS) tablet Take 1 tablet by mouth daily.     Probiotic Product (PROBIOTIC-10 PO) Take 1 tablet by mouth daily.     No facility-administered medications prior to visit.    Allergies  Allergen Reactions   Clindamycin Rash   Nsaids Other (See Comments)   Penicillins    Doxycycline    Doxycycline Calcium Hives   Erythromycin    Erythromycin Base     ROS Review of Systems  HENT:  Negative for congestion, ear discharge, sinus pressure and sinus pain.   Eyes:  Negative for discharge, redness and itching.  Respiratory:  Negative for shortness of breath and wheezing.   Cardiovascular:  Negative for chest pain.  Allergic/Immunologic: Positive for environmental allergies.  Neurological:  Negative for dizziness and headaches.  All other  systems reviewed and are negative.    Objective:    Physical Exam Vitals reviewed.  Constitutional:      Appearance: Normal appearance. She is normal weight.  HENT:     Head: Normocephalic.  Pulmonary:     Effort: Pulmonary effort is normal.  Skin:    General: Skin is warm and dry.  Neurological:     Mental Status: She is alert and oriented to person, place, and time. Mental status is at baseline.  Psychiatric:        Mood and Affect: Mood normal.        Behavior: Behavior normal.        Thought Content: Thought content normal.    There were no vitals taken for this visit. Wt Readings from Last 3 Encounters:  08/21/21 105 lb (47.6 kg)  03/19/21 102 lb 12.8 oz (46.6 kg)  09/11/20 101 lb (45.8 kg)     Health Maintenance Due  Topic Date Due   HIV Screening  Never  done   Zoster Vaccines- Shingrix (1 of 2) Never done   COVID-19 Vaccine (5 - Booster for Moderna series) 03/07/2021    There are no preventive care reminders to display for this patient.  Lab Results  Component Value Date   TSH 0.58 08/21/2021   Lab Results  Component Value Date   WBC 5.7 09/09/2021   HGB 13.8 09/09/2021   HCT 40 09/09/2021   MCV 90.3 08/21/2021   PLT 297 09/09/2021   Lab Results  Component Value Date   NA 142 09/09/2021   K 4.6 09/09/2021   CO2 22 09/09/2021   GLUCOSE 83 03/13/2021   BUN 11 09/09/2021   CREATININE 0.8 09/09/2021   BILITOT 0.6 03/13/2021   ALKPHOS 63 09/09/2021   AST 15 09/09/2021   ALT 12 09/09/2021   PROT 7.1 03/13/2021   ALBUMIN 5.1 (A) 09/09/2021   CALCIUM 10.1 09/09/2021   ANIONGAP 5 10/25/2020   EGFR 75 03/13/2021   Lab Results  Component Value Date   CHOL 192 03/13/2021   Lab Results  Component Value Date   HDL 64 03/13/2021   Lab Results  Component Value Date   LDLCALC 112 (H) 03/13/2021   Lab Results  Component Value Date   TRIG 69 03/13/2021   Lab Results  Component Value Date   CHOLHDL 3.0 03/13/2021   Lab Results  Component Value Date   HGBA1C 5.2 02/22/2020      Assessment & Plan:    Allergy desensitization therapy    Injection Note: 0.70m of new serum administered SubQ in left arm as ordered.  0.1 mL of new serum administered SubQ in right arm as ordered.  Right arm was given 0.5 ml SQ as ordered. No adverse reaction noted.  Pt declined to stay the thirty minutes to monitor. She has epi-pen with her. Will notify if any problems  Follow-up:  1. Continue to monitor for s/s allergic reaction. Carry epi-pen at all times. Notify if any problems. Continue taking PO allergy regimen.  Azelastine ophthalmic drops refilled.  RTC in 1 week for allergy serum injections.   MDrucilla Chalet NP  HPI

## 2021-09-22 ENCOUNTER — Ambulatory Visit
Admission: RE | Admit: 2021-09-22 | Discharge: 2021-09-22 | Disposition: A | Discharge status: Home / Self Care | Payer: PRIVATE HEALTH INSURANCE | Source: Ambulatory Visit | Attending: Gastroenterology | Admitting: Gastroenterology

## 2021-09-22 ENCOUNTER — Other Ambulatory Visit: Payer: Self-pay

## 2021-09-22 DIAGNOSIS — R110 Nausea: Secondary | ICD-10-CM

## 2021-09-22 DIAGNOSIS — R1011 Right upper quadrant pain: Secondary | ICD-10-CM

## 2021-09-25 ENCOUNTER — Encounter: Payer: Self-pay | Admitting: Internal Medicine

## 2021-09-25 ENCOUNTER — Ambulatory Visit: Payer: PRIVATE HEALTH INSURANCE | Admitting: Internal Medicine

## 2021-09-25 ENCOUNTER — Other Ambulatory Visit: Payer: Self-pay

## 2021-09-25 ENCOUNTER — Ambulatory Visit: Payer: Self-pay | Admitting: Nurse Practitioner

## 2021-09-25 VITALS — BP 114/78 | HR 70 | Temp 98.3°F | Ht 61.0 in | Wt 102.6 lb

## 2021-09-25 DIAGNOSIS — R1013 Epigastric pain: Secondary | ICD-10-CM

## 2021-09-25 DIAGNOSIS — R1031 Right lower quadrant pain: Secondary | ICD-10-CM | POA: Diagnosis not present

## 2021-09-25 DIAGNOSIS — L659 Nonscarring hair loss, unspecified: Secondary | ICD-10-CM

## 2021-09-25 DIAGNOSIS — Z2821 Immunization not carried out because of patient refusal: Secondary | ICD-10-CM

## 2021-09-25 DIAGNOSIS — I10 Essential (primary) hypertension: Secondary | ICD-10-CM

## 2021-09-25 DIAGNOSIS — Z516 Encounter for desensitization to allergens: Secondary | ICD-10-CM

## 2021-09-25 MED ORDER — FAMOTIDINE 20 MG PO TABS: 20.0000 mg | ORAL_TABLET | Freq: Two times a day (BID) | ORAL | 1 refills | Status: DC

## 2021-09-25 NOTE — Patient Instructions (Signed)

## 2021-09-25 NOTE — Progress Notes (Signed)
Established Patient Office Visit  Subjective:  Patient ID: Susan Mason, female    DOB: Nov 25, 1960  Age: 61 y.o. MRN: [id number]  CC: History of allergic asthma   HPI [facility] presents for allergy injections. She is feeling well today with no concerns regarding her allergy shots. She is in her 2nd week of new serums today and will be following new dosage protocol per order from allergist. She had no reactions or concerns from last week's injections.  Denies any new symptoms r/t allergies.    Past Medical History:  Diagnosis Date   Abnormal Pap smear 2008   ASCUS   High blood pressure    Metrorrhagia    Osteopenia    Ovarian cyst    Yeast vaginitis     Past Surgical History:  Procedure Laterality Date   COLPOSCOPY  2008   neg bx    Family History  Problem Relation Age of Onset   Hyperlipidemia Mother    Hypertension Mother    Heart Problems Mother    Kidney disease Mother    Lung cancer Father    Emphysema Father    Breast cancer Neg Hx     Social History   Socioeconomic History   Marital status: Married    Spouse name: Not on file   Number of children: Not on file   Years of education: Not on file   Highest education level: Not on file  Occupational History   Not on file  Tobacco Use   Smoking status: Never   Smokeless tobacco: Never  Vaping Use   Vaping Use: Never used  Substance and Sexual Activity   Alcohol use: No   Drug use: No   Sexual activity: Not on file  Other Topics Concern   Not on file  Social History Narrative   Lives at home with husband and mother    Social Determinants of Health   Financial Resource Strain: Not on file  Food Insecurity: Not on file  Transportation Needs: Not on file  Physical Activity: Not on file  Stress: Not on file  Social Connections: Not on file  Intimate Partner Violence: Not on file    Outpatient Medications Prior to Visit  Medication Sig Dispense Refill   azelastine (OPTIVAR) 0.05 %  ophthalmic solution Apply 1 drop to eye daily. 6 mL 2   cetirizine (ZYRTEC) 10 MG tablet Take 10 mg by mouth daily.      cholecalciferol (VITAMIN D) 1000 UNITS tablet Take 1,000 Units by mouth daily.     fexofenadine-pseudoephedrine (ALLEGRA-D 24) 180-240 MG 24 hr tablet Take 1 tablet by mouth daily.     lisinopril (ZESTRIL) 5 MG tablet TAKE ONE TABLET BY MOUTH DAILY 30 tablet 1   Multiple Vitamins-Minerals (MULTIVITAMIN WITH MINERALS) tablet Take 1 tablet by mouth daily.     Probiotic Product (PROBIOTIC-10 PO) Take 1 tablet by mouth daily.     No facility-administered medications prior to visit.    Allergies  Allergen Reactions   Clindamycin Rash   Nsaids Other (See Comments)   Penicillins    Doxycycline    Doxycycline Calcium Hives   Erythromycin    Erythromycin Base     ROS Review of Systems  HENT:  Negative for congestion, ear discharge, sinus pressure and sinus pain.   Eyes:  Negative for discharge, redness and itching.  Respiratory:  Negative for shortness of breath and wheezing.   Cardiovascular:  Negative for chest pain.  Allergic/Immunologic: Positive for environmental allergies.  Neurological:  Negative for dizziness and headaches.  All other systems reviewed and are negative.    Objective:    Physical Exam Vitals reviewed.  Constitutional:      Appearance: Normal appearance. She is normal weight.  HENT:     Head: Normocephalic.  Pulmonary:     Effort: Pulmonary effort is normal.  Skin:    General: Skin is warm and dry.  Neurological:     Mental Status: She is alert and oriented to person, place, and time. Mental status is at baseline.  Psychiatric:        Mood and Affect: Mood normal.        Behavior: Behavior normal.        Thought Content: Thought content normal.    There were no vitals taken for this visit. Wt Readings from Last 3 Encounters:  08/21/21 105 lb (47.6 kg)  03/19/21 102 lb 12.8 oz (46.6 kg)  09/11/20 101 lb (45.8 kg)     Health  Maintenance Due  Topic Date Due   HIV Screening  Never done   Zoster Vaccines- Shingrix (1 of 2) Never done   COVID-19 Vaccine (5 - Booster for Moderna series) 03/07/2021    There are no preventive care reminders to display for this patient.  Lab Results  Component Value Date   TSH 0.58 08/21/2021   Lab Results  Component Value Date   WBC 5.7 09/09/2021   HGB 13.8 09/09/2021   HCT 40 09/09/2021   MCV 90.3 08/21/2021   PLT 297 09/09/2021   Lab Results  Component Value Date   NA 142 09/09/2021   K 4.6 09/09/2021   CO2 22 09/09/2021   GLUCOSE 83 03/13/2021   BUN 11 09/09/2021   CREATININE 0.8 09/09/2021   BILITOT 0.6 03/13/2021   ALKPHOS 63 09/09/2021   AST 15 09/09/2021   ALT 12 09/09/2021   PROT 7.1 03/13/2021   ALBUMIN 5.1 (A) 09/09/2021   CALCIUM 10.1 09/09/2021   ANIONGAP 5 10/25/2020   EGFR 75 03/13/2021   Lab Results  Component Value Date   CHOL 192 03/13/2021   Lab Results  Component Value Date   HDL 64 03/13/2021   Lab Results  Component Value Date   LDLCALC 112 (H) 03/13/2021   Lab Results  Component Value Date   TRIG 69 03/13/2021   Lab Results  Component Value Date   CHOLHDL 3.0 03/13/2021   Lab Results  Component Value Date   HGBA1C 5.2 02/22/2020      Assessment & Plan:    Allergy desensitization therapy    Injection Note: 0.80m of new serum administered SubQ in left arm as ordered.  0.2 mL of new serum administered SubQ in right arm as ordered.  No adverse reaction noted.  Pt declined to stay the thirty minutes to monitor. She has epi-pen with her. Will notify if any problems  Follow-up:  1. Continue to monitor for s/s allergic reaction. Carry epi-pen at all times. Notify if any problems. Continue taking PO allergy regimen.  RTC in 1 week for allergy serum injections.   MDrucilla Chalet NP  HPI

## 2021-09-25 NOTE — Progress Notes (Signed)
Jeri Cos [place]ittleton,acting as a Neurosurgeon for [name], MD.,have documented all relevant documentation on the behalf of [name], MD,as directed by  [name], MD while in the presence of [name], MD.  This visit occurred during the SARS-CoV-2 public health emergency.  Safety protocols were in place, including screening questions prior to the visit, additional usage of staff PPE, and extensive cleaning of exam room while observing appropriate contact time as indicated for disinfecting solutions.  Subjective:     Patient ID: Albertha Ghee , female    DOB: [birth year] , 61 y.o.   MRN: [id number]   Chief Complaint  Patient presents with   Hypertension    HPI  The patient is here today for a blood pressure follow-up.  She reports compliance with meds. She denies headaches, chest pain and shortness of breath.   Hypertension This is a chronic problem. The current episode started more than 1 year ago. The problem is unchanged. The problem is controlled. Pertinent negatives include no anxiety or malaise/fatigue. Past treatments include ACE inhibitors. There are no compliance problems.  There is no history of CAD/MI.    Past Medical History:  Diagnosis Date   Abnormal Pap smear 2008   ASCUS   High blood pressure    Metrorrhagia    Osteopenia    Ovarian cyst    Yeast vaginitis      Family History  Problem Relation Age of Onset   Hyperlipidemia Mother    Hypertension Mother    Heart Problems Mother    Kidney disease Mother    Lung cancer Father    Emphysema Father    Breast cancer Neg Hx      Current Outpatient Medications:    azelastine (OPTIVAR) 0.05 % ophthalmic solution, Apply 1 drop to eye daily., Disp: 6 mL, Rfl: 2   cetirizine (ZYRTEC) 10 MG tablet, Take 10 mg by mouth daily. , Disp: , Rfl:    cholecalciferol (VITAMIN D) 1000 UNITS tablet, Take 1,000 Units by mouth daily., Disp: , Rfl:    fexofenadine-pseudoephedrine (ALLEGRA-D 24) 180-240 MG 24 hr  tablet, Take 1 tablet by mouth daily., Disp: , Rfl:    lisinopril (ZESTRIL) 5 MG tablet, TAKE ONE TABLET BY MOUTH DAILY, Disp: 30 tablet, Rfl: 1   Multiple Vitamins-Minerals (MULTIVITAMIN WITH MINERALS) tablet, Take 1 tablet by mouth daily., Disp: , Rfl:    ondansetron (ZOFRAN-ODT) 8 MG disintegrating tablet, Take 8 mg by mouth 3 (three) times daily., Disp: , Rfl:    Probiotic Product (PROBIOTIC-10 PO), Take 1 tablet by mouth daily., Disp: , Rfl:    Allergies  Allergen Reactions   Clindamycin Rash   Nsaids Other (See Comments)   Penicillins    Doxycycline    Doxycycline Calcium Hives   Erythromycin    Erythromycin Base      Review of Systems  Constitutional: Negative.  Negative for malaise/fatigue.  Respiratory: Negative.    Cardiovascular: Negative.   Gastrointestinal:  Positive for abdominal distention.       She reports having abdominal discomfort associated w/ bloating. She has been seen by GI and started on beano. She had GB u/s on Monday. Also with RLQ pain, scheduled for pelvic u/s next Friday with OB/GYN.   Neurological: Negative.   Psychiatric/Behavioral: Negative.      Today's Vitals   09/25/21 1530  BP: 114/78  Pulse: 70  Temp: 98.3 F (36.8 C)  Weight: 102 lb 9.6 oz (46.5 kg)  Height: 5'  1" (1.549 m)  PainSc: 0-No pain   Body mass index is 19.39 kg/m.  Wt Readings from Last 3 Encounters:  09/25/21 102 lb 9.6 oz (46.5 kg)  08/21/21 105 lb (47.6 kg)  03/19/21 102 lb 12.8 oz (46.6 kg)     Objective:  Physical Exam Vitals and nursing note reviewed.  Constitutional:      Appearance: Normal appearance.  HENT:     Head: Normocephalic and atraumatic.     Nose:     Comments: Masked     Mouth/Throat:     Comments: Masked  Eyes:     Extraocular Movements: Extraocular movements intact.  Cardiovascular:     Rate and Rhythm: Normal rate and regular rhythm.     Heart sounds: Normal heart sounds.  Pulmonary:     Effort: Pulmonary effort is normal.     Breath  sounds: Normal breath sounds.  Abdominal:     General: Bowel sounds are normal.     Palpations: Abdomen is soft.     Tenderness: There is abdominal tenderness in the right lower quadrant. There is no right CVA tenderness or left CVA tenderness.       Comments: Tenderness to palpation  Musculoskeletal:     Cervical back: Normal range of motion.  Skin:    General: Skin is warm.  Neurological:     General: No focal deficit present.     Mental Status: She is alert.  Psychiatric:        Mood and Affect: Mood normal.        Behavior: Behavior normal.        Assessment And Plan:     1. Essential hypertension Comments: Chronic, well controlled. Renal function results reviewed, both Jan/Feb 2023. She is encouraged to continue with low sodium diet. She will f/u in six months for her next physical exam.   2. Dyspepsia Comments: I suggest starting famotidine 20mg  daily in PM. Gallbladder results reviewed in detail, nl. She will c/w beano once daily in AM.   3. Hair loss Comments: CBC drawn recently, this is normal. I will put in future orders for iron panel, she prefers to have labs drawn at work.  - Iron, TIBC and Ferritin Panel; Future  4. Right groin pain Comments: There is tenderness to R inguinal area w/ palpation. Advised ddx includes hip flexor strain - advised to perform stretches. Encouraged to keep GYN appt.   5. Herpes zoster vaccination declined   Patient was given opportunity to ask questions. Patient verbalized understanding of the plan and was able to repeat key elements of the plan. All questions were answered to their satisfaction.   I, [place], MD, have reviewed all documentation for this visit. The documentation on 09/25/21 for the exam, diagnosis, procedures, and orders are all accurate and complete.   IF YOU HAVE BEEN REFERRED TO A SPECIALIST, IT MAY TAKE 1-2 WEEKS TO SCHEDULE/PROCESS THE REFERRAL. IF YOU HAVE NOT HEARD FROM US/SPECIALIST IN TWO WEEKS,  PLEASE GIVE 09/27/21 A CALL AT [phone].   THE PATIENT IS ENCOURAGED TO PRACTICE SOCIAL DISTANCING DUE TO THE COVID-19 PANDEMIC.

## 2021-10-02 ENCOUNTER — Other Ambulatory Visit: Payer: Self-pay | Admitting: Nurse Practitioner

## 2021-10-02 ENCOUNTER — Ambulatory Visit: Payer: Self-pay | Admitting: Nurse Practitioner

## 2021-10-02 ENCOUNTER — Other Ambulatory Visit: Payer: Self-pay

## 2021-10-02 DIAGNOSIS — L659 Nonscarring hair loss, unspecified: Secondary | ICD-10-CM

## 2021-10-02 DIAGNOSIS — Z516 Encounter for desensitization to allergens: Secondary | ICD-10-CM

## 2021-10-02 NOTE — Progress Notes (Signed)
PCP, Dr. Dorothyann Peng, ordered Iron, TBIC, and Ferritin Panel to be drawn today. CC'd Dr. Allyne Gee to results of lab draw today. Patient was told by a different provider to fast, but was unable to today. She tolerated the procedure well. Will follow up with results, unless Dr. Allyne Gee discusses them with her first.  ? ?Terie Purser, NP  ?

## 2021-10-02 NOTE — Progress Notes (Signed)
?Established Patient Office Visit ? ?Subjective:  ?Patient ID: Susan Mason, female    DOB: 09/03/1960  Age: 61 y.o. MRN: [id number] ? ?CC: History of allergic asthma ? ? ?HPI ?Susan Mason presents for allergy injections. She is feeling well today with no concerns regarding her allergy shots. She is in her 3rd week of new serums today and will be following new dosage protocol per order from allergist. She had no reactions or concerns from last week's injections.  ?Denies any new symptoms r/t allergies.  ? ? ?Past Medical History:  ?Diagnosis Date  ? Abnormal Pap smear 2008  ? ASCUS  ? High blood pressure   ? Metrorrhagia   ? Osteopenia   ? Ovarian cyst   ? Yeast vaginitis   ? ? ?Past Surgical History:  ?Procedure Laterality Date  ? COLPOSCOPY  2008  ? neg bx  ? ? ?Family History  ?Problem Relation Age of Onset  ? Hyperlipidemia Mother   ? Hypertension Mother   ? Heart Problems Mother   ? Kidney disease Mother   ? Lung cancer Father   ? Emphysema Father   ? Breast cancer Neg Hx   ? ? ?Social History  ? ?Socioeconomic History  ? Marital status: Married  ?  Spouse name: Not on file  ? Number of children: Not on file  ? Years of education: Not on file  ? Highest education level: Not on file  ?Occupational History  ? Not on file  ?Tobacco Use  ? Smoking status: Never  ? Smokeless tobacco: Never  ?Vaping Use  ? Vaping Use: Never used  ?Substance and Sexual Activity  ? Alcohol use: No  ? Drug use: No  ? Sexual activity: Not on file  ?Other Topics Concern  ? Not on file  ?Social History Narrative  ? Lives at home with husband and mother   ? ?Social Determinants of Health  ? ?Financial Resource Strain: Not on file  ?Food Insecurity: Not on file  ?Transportation Needs: Not on file  ?Physical Activity: Not on file  ?Stress: Not on file  ?Social Connections: Not on file  ?Intimate Partner Violence: Not on file  ? ? ?Outpatient Medications Prior to Visit  ?Medication Sig Dispense Refill  ? azelastine (OPTIVAR) 0.05 %  ophthalmic solution Apply 1 drop to eye daily. 6 mL 2  ? cetirizine (ZYRTEC) 10 MG tablet Take 10 mg by mouth daily.     ? cholecalciferol (VITAMIN D) 1000 UNITS tablet Take 1,000 Units by mouth daily.    ? famotidine (PEPCID) 20 MG tablet Take 1 tablet (20 mg total) by mouth 2 (two) times daily. 60 tablet 1  ? fexofenadine-pseudoephedrine (ALLEGRA-D 24) 180-240 MG 24 hr tablet Take 1 tablet by mouth daily.    ? lisinopril (ZESTRIL) 5 MG tablet TAKE ONE TABLET BY MOUTH DAILY 30 tablet 1  ? Multiple Vitamins-Minerals (MULTIVITAMIN WITH MINERALS) tablet Take 1 tablet by mouth daily.    ? ondansetron (ZOFRAN-ODT) 8 MG disintegrating tablet Take 8 mg by mouth 3 (three) times daily.    ? Probiotic Product (PROBIOTIC-10 PO) Take 1 tablet by mouth daily.    ? ?No facility-administered medications prior to visit.  ? ? ?Allergies  ?Allergen Reactions  ? Clindamycin Rash  ? Nsaids Other (See Comments)  ? Penicillins   ? Doxycycline   ? Doxycycline Calcium Hives  ? Erythromycin   ? Erythromycin Base   ? ? ?ROS ?Review of Systems  ?HENT:  Negative for congestion, ear  discharge, sinus pressure and sinus pain.   ?Eyes:  Negative for discharge, redness and itching.  ?Respiratory:  Negative for shortness of breath and wheezing.   ?Cardiovascular:  Negative for chest pain.  ?Allergic/Immunologic: Positive for environmental allergies.  ?Neurological:  Negative for dizziness and headaches.  ?All other systems reviewed and are negative. ? ?  ?Objective:  ?  ?Physical Exam ?Vitals reviewed.  ?Constitutional:   ?   Appearance: Normal appearance. She is normal weight.  ?HENT:  ?   Head: Normocephalic.  ?Pulmonary:  ?   Effort: Pulmonary effort is normal.  ?Skin: ?   General: Skin is warm and dry.  ?Neurological:  ?   Mental Status: She is alert and oriented to person, place, and time. Mental status is at baseline.  ?Psychiatric:     ?   Mood and Affect: Mood normal.     ?   Behavior: Behavior normal.     ?   Thought Content: Thought  content normal.  ? ? ?There were no vitals taken for this visit. ?Wt Readings from Last 3 Encounters:  ?09/25/21 102 lb 9.6 oz (46.5 kg)  ?08/21/21 105 lb (47.6 kg)  ?03/19/21 102 lb 12.8 oz (46.6 kg)  ? ? ? ?Health Maintenance Due  ?Topic Date Due  ? INFLUENZA VACCINE  03/03/2021  ? COVID-19 Vaccine (5 - Booster for Moderna series) 03/07/2021  ? ? ?There are no preventive care reminders to display for this patient. ? ?Lab Results  ?Component Value Date  ? TSH 0.58 08/21/2021  ? ?Lab Results  ?Component Value Date  ? WBC 5.7 09/09/2021  ? HGB 13.8 09/09/2021  ? HCT 40 09/09/2021  ? MCV 90.3 08/21/2021  ? PLT 297 09/09/2021  ? ?Lab Results  ?Component Value Date  ? NA 142 09/09/2021  ? K 4.6 09/09/2021  ? CO2 22 09/09/2021  ? GLUCOSE 83 03/13/2021  ? BUN 11 09/09/2021  ? CREATININE 0.8 09/09/2021  ? BILITOT 0.6 03/13/2021  ? ALKPHOS 63 09/09/2021  ? AST 15 09/09/2021  ? ALT 12 09/09/2021  ? PROT 7.1 03/13/2021  ? ALBUMIN 5.1 (A) 09/09/2021  ? CALCIUM 10.1 09/09/2021  ? ANIONGAP 5 10/25/2020  ? EGFR 75 03/13/2021  ? ?Lab Results  ?Component Value Date  ? CHOL 192 03/13/2021  ? ?Lab Results  ?Component Value Date  ? HDL 64 03/13/2021  ? ?Lab Results  ?Component Value Date  ? LDLCALC 112 (H) 03/13/2021  ? ?Lab Results  ?Component Value Date  ? TRIG 69 03/13/2021  ? ?Lab Results  ?Component Value Date  ? CHOLHDL 3.0 03/13/2021  ? ?Lab Results  ?Component Value Date  ? HGBA1C 5.2 02/22/2020  ? ? ?  ?Assessment & Plan:  ? ? ?Allergy desensitization therapy ?  ? ?Injection Note: 0.3 mL of new serum administered SubQ in left arm as ordered.  ?0.3 mL of new serum administered SubQ in right arm as ordered.  ?No adverse reaction noted.  ?Pt declined to stay the thirty minutes to monitor. She has epi-pen with her. Will notify if any problems ? ?Follow-up:  ?1. Continue to monitor for s/s allergic reaction. Carry epi-pen at all times. Notify if any problems. ?Continue taking PO allergy regimen.  ?RTC in 1 week for allergy serum  injections.  ? ?Drucilla Chalet, NP ? ?HPI ?

## 2021-10-03 LAB — IRON,TIBC AND FERRITIN PANEL
%SAT: 36 % (calc) (ref 16–45)
Ferritin: 66 ng/mL (ref 16–288)
Iron: 115 ug/dL (ref 45–160)
TIBC: 323 mcg/dL (calc) (ref 250–450)

## 2021-10-03 LAB — EXTRA LAV TOP TUBE

## 2021-10-09 ENCOUNTER — Ambulatory Visit: Payer: Self-pay | Admitting: Nurse Practitioner

## 2021-10-09 ENCOUNTER — Other Ambulatory Visit: Payer: Self-pay

## 2021-10-09 DIAGNOSIS — Z516 Encounter for desensitization to allergens: Secondary | ICD-10-CM

## 2021-10-09 NOTE — Progress Notes (Signed)
?Established Patient Office Visit ? ?Subjective:  ?Patient ID: Susan Mason, female    DOB: 1961-06-16  Age: 61 y.o. MRN: [id number] ? ?CC: History of allergic asthma ? ? ?HPI ?Susan Mason presents for allergy injections. She is feeling well today with no concerns regarding her allergy shots. She is in her 4th week of new serums today and will be following new dosage protocol per order from allergist. She had a reaction from her serum on both arms the day of injections. They were golf ball sized lumps on the back of both arms that were red. Denies the area being itchy or tender. The lumps went away by the end of the day. Denies anaphylaxis reaction. This has occurred to her before and usually stays at the same dose of the serum until she has no reactions.  ?Denies any new symptoms r/t allergies.  ? ? ?Past Medical History:  ?Diagnosis Date  ? Abnormal Pap smear 2008  ? ASCUS  ? High blood pressure   ? Metrorrhagia   ? Osteopenia   ? Ovarian cyst   ? Yeast vaginitis   ? ? ?Past Surgical History:  ?Procedure Laterality Date  ? COLPOSCOPY  2008  ? neg bx  ? ? ?Family History  ?Problem Relation Age of Onset  ? Hyperlipidemia Mother   ? Hypertension Mother   ? Heart Problems Mother   ? Kidney disease Mother   ? Lung cancer Father   ? Emphysema Father   ? Breast cancer Neg Hx   ? ? ?Social History  ? ?Socioeconomic History  ? Marital status: Married  ?  Spouse name: Not on file  ? Number of children: Not on file  ? Years of education: Not on file  ? Highest education level: Not on file  ?Occupational History  ? Not on file  ?Tobacco Use  ? Smoking status: Never  ? Smokeless tobacco: Never  ?Vaping Use  ? Vaping Use: Never used  ?Substance and Sexual Activity  ? Alcohol use: No  ? Drug use: No  ? Sexual activity: Not on file  ?Other Topics Concern  ? Not on file  ?Social History Narrative  ? Lives at home with husband and mother   ? ?Social Determinants of Health  ? ?Financial Resource Strain: Not on file  ?Food  Insecurity: Not on file  ?Transportation Needs: Not on file  ?Physical Activity: Not on file  ?Stress: Not on file  ?Social Connections: Not on file  ?Intimate Partner Violence: Not on file  ? ? ?Outpatient Medications Prior to Visit  ?Medication Sig Dispense Refill  ? azelastine (OPTIVAR) 0.05 % ophthalmic solution Apply 1 drop to eye daily. 6 mL 2  ? cetirizine (ZYRTEC) 10 MG tablet Take 10 mg by mouth daily.     ? cholecalciferol (VITAMIN D) 1000 UNITS tablet Take 1,000 Units by mouth daily.    ? famotidine (PEPCID) 20 MG tablet Take 1 tablet (20 mg total) by mouth 2 (two) times daily. 60 tablet 1  ? fexofenadine-pseudoephedrine (ALLEGRA-D 24) 180-240 MG 24 hr tablet Take 1 tablet by mouth daily.    ? lisinopril (ZESTRIL) 5 MG tablet TAKE ONE TABLET BY MOUTH DAILY 30 tablet 1  ? Multiple Vitamins-Minerals (MULTIVITAMIN WITH MINERALS) tablet Take 1 tablet by mouth daily.    ? ondansetron (ZOFRAN-ODT) 8 MG disintegrating tablet Take 8 mg by mouth 3 (three) times daily.    ? Probiotic Product (PROBIOTIC-10 PO) Take 1 tablet by mouth daily.    ? ?  No facility-administered medications prior to visit.  ? ? ?Allergies  ?Allergen Reactions  ? Clindamycin Rash  ? Nsaids Other (See Comments)  ? Penicillins   ? Doxycycline   ? Doxycycline Calcium Hives  ? Erythromycin   ? Erythromycin Base   ? ? ?ROS ?Review of Systems  ?HENT:  Negative for congestion, ear discharge, sinus pressure and sinus pain.   ?Eyes:  Negative for discharge, redness and itching.  ?Respiratory:  Negative for shortness of breath and wheezing.   ?Cardiovascular:  Negative for chest pain.  ?Allergic/Immunologic: Positive for environmental allergies.  ?Neurological:  Negative for dizziness and headaches.  ?All other systems reviewed and are negative. ? ?  ?Objective:  ?  ?Physical Exam ?Vitals reviewed.  ?Constitutional:   ?   Appearance: Normal appearance. She is normal weight.  ?HENT:  ?   Head: Normocephalic.  ?Pulmonary:  ?   Effort: Pulmonary effort is  normal.  ?Skin: ?   General: Skin is warm and dry.  ?Neurological:  ?   Mental Status: She is alert and oriented to person, place, and time. Mental status is at baseline.  ?Psychiatric:     ?   Mood and Affect: Mood normal.     ?   Behavior: Behavior normal.     ?   Thought Content: Thought content normal.  ? ? ?There were no vitals taken for this visit. ?Wt Readings from Last 3 Encounters:  ?09/25/21 102 lb 9.6 oz (46.5 kg)  ?08/21/21 105 lb (47.6 kg)  ?03/19/21 102 lb 12.8 oz (46.6 kg)  ? ? ? ?Health Maintenance Due  ?Topic Date Due  ? INFLUENZA VACCINE  03/03/2021  ? COVID-19 Vaccine (5 - Booster for Moderna series) 03/07/2021  ? ? ?There are no preventive care reminders to display for this patient. ? ?Lab Results  ?Component Value Date  ? TSH 0.58 08/21/2021  ? ?Lab Results  ?Component Value Date  ? WBC 5.7 09/09/2021  ? HGB 13.8 09/09/2021  ? HCT 40 09/09/2021  ? MCV 90.3 08/21/2021  ? PLT 297 09/09/2021  ? ?Lab Results  ?Component Value Date  ? NA 142 09/09/2021  ? K 4.6 09/09/2021  ? CO2 22 09/09/2021  ? GLUCOSE 83 03/13/2021  ? BUN 11 09/09/2021  ? CREATININE 0.8 09/09/2021  ? BILITOT 0.6 03/13/2021  ? ALKPHOS 63 09/09/2021  ? AST 15 09/09/2021  ? ALT 12 09/09/2021  ? PROT 7.1 03/13/2021  ? ALBUMIN 5.1 (A) 09/09/2021  ? CALCIUM 10.1 09/09/2021  ? ANIONGAP 5 10/25/2020  ? EGFR 75 03/13/2021  ? ?Lab Results  ?Component Value Date  ? CHOL 192 03/13/2021  ? ?Lab Results  ?Component Value Date  ? HDL 64 03/13/2021  ? ?Lab Results  ?Component Value Date  ? LDLCALC 112 (H) 03/13/2021  ? ?Lab Results  ?Component Value Date  ? TRIG 69 03/13/2021  ? ?Lab Results  ?Component Value Date  ? CHOLHDL 3.0 03/13/2021  ? ?Lab Results  ?Component Value Date  ? HGBA1C 5.2 02/22/2020  ? ? ?  ?Assessment & Plan:  ? ? ?Allergy desensitization therapy ?  ? ?Injection Note: 0.3 mL of new serum administered SubQ in left arm as ordered.  ?0.3 mL of new serum administered SubQ in right arm as ordered.  ?No immediate adverse reaction  noted.  ?Pt declined to stay the thirty minutes to monitor. She has epi-pen with her. Will notify if any problems ? ?Follow-up:  ?1. Continue to monitor for s/s allergic  reaction. Carry epi-pen at all times. Notify if any problems. ?Continue taking PO allergy regimen.  ?RTC in 1 week for allergy serum injections.  ? ?Drucilla Chalet, NP ? ?HPI ?

## 2021-10-16 ENCOUNTER — Other Ambulatory Visit: Payer: Self-pay

## 2021-10-16 ENCOUNTER — Ambulatory Visit: Payer: Self-pay | Admitting: Nurse Practitioner

## 2021-10-16 NOTE — Progress Notes (Signed)
?Established Patient Office Visit ? ?Subjective:  ?Patient ID: Susan Mason, female    DOB: 1961-01-17  Age: 61 y.o. MRN: [id number] ? ?CC: History of allergic asthma ? ? ?HPI ?Susan Mason presents for allergy injections. She is feeling well today with no concerns regarding her allergy shots. She is in her 5th week of new serums today and will be following new dosage protocol per order from allergist. She had no reactions to there serum injections last week.  ?Denies any new symptoms r/t allergies.  ? ?Susan Mason states she will not be in clinic the next 2 weeks for her usual serum injections. She will be back on April 6th. ? ?Past Medical History:  ?Diagnosis Date  ? Abnormal Pap smear 2008  ? ASCUS  ? High blood pressure   ? Metrorrhagia   ? Osteopenia   ? Ovarian cyst   ? Yeast vaginitis   ? ? ?Past Surgical History:  ?Procedure Laterality Date  ? COLPOSCOPY  2008  ? neg bx  ? ? ?Family History  ?Problem Relation Age of Onset  ? Hyperlipidemia Mother   ? Hypertension Mother   ? Heart Problems Mother   ? Kidney disease Mother   ? Lung cancer Father   ? Emphysema Father   ? Breast cancer Neg Hx   ? ? ?Social History  ? ?Socioeconomic History  ? Marital status: Married  ?  Spouse name: Not on file  ? Number of children: Not on file  ? Years of education: Not on file  ? Highest education level: Not on file  ?Occupational History  ? Not on file  ?Tobacco Use  ? Smoking status: Never  ? Smokeless tobacco: Never  ?Vaping Use  ? Vaping Use: Never used  ?Substance and Sexual Activity  ? Alcohol use: No  ? Drug use: No  ? Sexual activity: Not on file  ?Other Topics Concern  ? Not on file  ?Social History Narrative  ? Lives at home with husband and mother   ? ?Social Determinants of Health  ? ?Financial Resource Strain: Not on file  ?Food Insecurity: Not on file  ?Transportation Needs: Not on file  ?Physical Activity: Not on file  ?Stress: Not on file  ?Social Connections: Not on file  ?Intimate Partner Violence: Not on file   ? ? ?Outpatient Medications Prior to Visit  ?Medication Sig Dispense Refill  ? azelastine (OPTIVAR) 0.05 % ophthalmic solution Apply 1 drop to eye daily. 6 mL 2  ? cetirizine (ZYRTEC) 10 MG tablet Take 10 mg by mouth daily.     ? cholecalciferol (VITAMIN D) 1000 UNITS tablet Take 1,000 Units by mouth daily.    ? famotidine (PEPCID) 20 MG tablet Take 1 tablet (20 mg total) by mouth 2 (two) times daily. 60 tablet 1  ? fexofenadine-pseudoephedrine (ALLEGRA-D 24) 180-240 MG 24 hr tablet Take 1 tablet by mouth daily.    ? lisinopril (ZESTRIL) 5 MG tablet TAKE ONE TABLET BY MOUTH DAILY 30 tablet 1  ? Multiple Vitamins-Minerals (MULTIVITAMIN WITH MINERALS) tablet Take 1 tablet by mouth daily.    ? ondansetron (ZOFRAN-ODT) 8 MG disintegrating tablet Take 8 mg by mouth 3 (three) times daily.    ? Probiotic Product (PROBIOTIC-10 PO) Take 1 tablet by mouth daily.    ? ?No facility-administered medications prior to visit.  ? ? ?Allergies  ?Allergen Reactions  ? Clindamycin Rash  ? Nsaids Other (See Comments)  ? Penicillins   ? Doxycycline   ? Doxycycline Calcium  Hives  ? Erythromycin   ? Erythromycin Base   ? ? ?ROS ?Review of Systems  ?HENT:  Negative for congestion, ear discharge, sinus pressure and sinus pain.   ?Eyes:  Negative for discharge, redness and itching.  ?Respiratory:  Negative for shortness of breath and wheezing.   ?Cardiovascular:  Negative for chest pain.  ?Allergic/Immunologic: Positive for environmental allergies.  ?Neurological:  Negative for dizziness and headaches.  ?All other systems reviewed and are negative. ? ?  ?Objective:  ?  ?Physical Exam ?Vitals reviewed.  ?Constitutional:   ?   Appearance: Normal appearance. She is normal weight.  ?HENT:  ?   Head: Normocephalic.  ?Pulmonary:  ?   Effort: Pulmonary effort is normal.  ?Skin: ?   General: Skin is warm and dry.  ?Neurological:  ?   Mental Status: She is alert and oriented to person, place, and time. Mental status is at baseline.  ?Psychiatric:      ?   Mood and Affect: Mood normal.     ?   Behavior: Behavior normal.     ?   Thought Content: Thought content normal.  ? ? ?There were no vitals taken for this visit. ?Wt Readings from Last 3 Encounters:  ?09/25/21 102 lb 9.6 oz (46.5 kg)  ?08/21/21 105 lb (47.6 kg)  ?03/19/21 102 lb 12.8 oz (46.6 kg)  ? ? ? ?Health Maintenance Due  ?Topic Date Due  ? INFLUENZA VACCINE  03/03/2021  ? COVID-19 Vaccine (5 - Booster for Moderna series) 03/07/2021  ? ? ?There are no preventive care reminders to display for this patient. ? ?Lab Results  ?Component Value Date  ? TSH 0.58 08/21/2021  ? ?Lab Results  ?Component Value Date  ? WBC 5.7 09/09/2021  ? HGB 13.8 09/09/2021  ? HCT 40 09/09/2021  ? MCV 90.3 08/21/2021  ? PLT 297 09/09/2021  ? ?Lab Results  ?Component Value Date  ? NA 142 09/09/2021  ? K 4.6 09/09/2021  ? CO2 22 09/09/2021  ? GLUCOSE 83 03/13/2021  ? BUN 11 09/09/2021  ? CREATININE 0.8 09/09/2021  ? BILITOT 0.6 03/13/2021  ? ALKPHOS 63 09/09/2021  ? AST 15 09/09/2021  ? ALT 12 09/09/2021  ? PROT 7.1 03/13/2021  ? ALBUMIN 5.1 (A) 09/09/2021  ? CALCIUM 10.1 09/09/2021  ? ANIONGAP 5 10/25/2020  ? EGFR 75 03/13/2021  ? ?Lab Results  ?Component Value Date  ? CHOL 192 03/13/2021  ? ?Lab Results  ?Component Value Date  ? HDL 64 03/13/2021  ? ?Lab Results  ?Component Value Date  ? LDLCALC 112 (H) 03/13/2021  ? ?Lab Results  ?Component Value Date  ? TRIG 69 03/13/2021  ? ?Lab Results  ?Component Value Date  ? CHOLHDL 3.0 03/13/2021  ? ?Lab Results  ?Component Value Date  ? HGBA1C 5.2 02/22/2020  ? ? ?  ?Assessment & Plan:  ? ? ?Allergy desensitization therapy ?  ? ?Injection Note: 0.4 mL of serum administered SubQ in left arm as ordered.  ?0.4 mL of serum administered SubQ in right arm as ordered.  ?No immediate adverse reaction noted.  ?Pt declined to stay the thirty minutes to monitor. She has epi-pen with her. Will notify if any problems ? ?Follow-up:  ?1. Continue to monitor for s/s allergic reaction. Carry epi-pen at  all times. Notify if any problems. ?Continue taking PO allergy regimen.  ?RTC in 3 weeks for allergy serum injections.  ? ?Drucilla Chalet, NP ? ?HPI ?

## 2021-10-21 ENCOUNTER — Other Ambulatory Visit: Payer: Self-pay

## 2021-10-21 ENCOUNTER — Ambulatory Visit: Payer: Self-pay | Admitting: Nurse Practitioner

## 2021-10-21 DIAGNOSIS — Z516 Encounter for desensitization to allergens: Secondary | ICD-10-CM

## 2021-10-21 NOTE — Progress Notes (Signed)
?Established Patient Office Visit ? ?Subjective:  ?Patient ID: Susan Mason, female    DOB: 17-May-1961  Age: 61 y.o. MRN: [id number] ? ?CC: History of allergic asthma ? ? ?HPI ?Susan Mason presents for allergy injections. She is feeling well today with no concerns regarding her allergy shots. She is in her 6th week of new serums today and will be following new dosage protocol per order from allergist. She had no reactions to there serum injections last week.  ?Denies any new symptoms r/t allergies.  ? ?[name] was going to skip this week and next week, since she is going to be out of work, but she decided to get her shots today and next Tuesday to make sure she received them as ordered both weeks.  ? ?Past Medical History:  ?Diagnosis Date  ? Abnormal Pap smear 2008  ? ASCUS  ? High blood pressure   ? Metrorrhagia   ? Osteopenia   ? Ovarian cyst   ? Yeast vaginitis   ? ? ?Past Surgical History:  ?Procedure Laterality Date  ? COLPOSCOPY  2008  ? neg bx  ? ? ?Family History  ?Problem Relation Age of Onset  ? Hyperlipidemia Mother   ? Hypertension Mother   ? Heart Problems Mother   ? Kidney disease Mother   ? Lung cancer Father   ? Emphysema Father   ? Breast cancer Neg Hx   ? ? ?Social History  ? ?Socioeconomic History  ? Marital status: Married  ?  Spouse name: Not on file  ? Number of children: Not on file  ? Years of education: Not on file  ? Highest education level: Not on file  ?Occupational History  ? Not on file  ?Tobacco Use  ? Smoking status: Never  ? Smokeless tobacco: Never  ?Vaping Use  ? Vaping Use: Never used  ?Substance and Sexual Activity  ? Alcohol use: No  ? Drug use: No  ? Sexual activity: Not on file  ?Other Topics Concern  ? Not on file  ?Social History Narrative  ? Lives at home with husband and mother   ? ?Social Determinants of Health  ? ?Financial Resource Strain: Not on file  ?Food Insecurity: Not on file  ?Transportation Needs: Not on file  ?Physical Activity: Not on file  ?Stress: Not on  file  ?Social Connections: Not on file  ?Intimate Partner Violence: Not on file  ? ? ?Outpatient Medications Prior to Visit  ?Medication Sig Dispense Refill  ? azelastine (OPTIVAR) 0.05 % ophthalmic solution Apply 1 drop to eye daily. 6 mL 2  ? cetirizine (ZYRTEC) 10 MG tablet Take 10 mg by mouth daily.     ? cholecalciferol (VITAMIN D) 1000 UNITS tablet Take 1,000 Units by mouth daily.    ? famotidine (PEPCID) 20 MG tablet Take 1 tablet (20 mg total) by mouth 2 (two) times daily. 60 tablet 1  ? fexofenadine-pseudoephedrine (ALLEGRA-D 24) 180-240 MG 24 hr tablet Take 1 tablet by mouth daily.    ? lisinopril (ZESTRIL) 5 MG tablet TAKE ONE TABLET BY MOUTH DAILY 30 tablet 1  ? Multiple Vitamins-Minerals (MULTIVITAMIN WITH MINERALS) tablet Take 1 tablet by mouth daily.    ? ondansetron (ZOFRAN-ODT) 8 MG disintegrating tablet Take 8 mg by mouth 3 (three) times daily.    ? Probiotic Product (PROBIOTIC-10 PO) Take 1 tablet by mouth daily.    ? ?No facility-administered medications prior to visit.  ? ? ?Allergies  ?Allergen Reactions  ? Clindamycin Rash  ?  Nsaids Other (See Comments)  ? Penicillins   ? Doxycycline   ? Doxycycline Calcium Hives  ? Erythromycin   ? Erythromycin Base   ? ? ?ROS ?Review of Systems  ?HENT:  Negative for congestion, ear discharge, sinus pressure and sinus pain.   ?Eyes:  Negative for discharge, redness and itching.  ?Respiratory:  Negative for shortness of breath and wheezing.   ?Cardiovascular:  Negative for chest pain.  ?Allergic/Immunologic: Positive for environmental allergies.  ?Neurological:  Negative for dizziness and headaches.  ?All other systems reviewed and are negative. ? ?  ?Objective:  ?  ?Physical Exam ?Vitals reviewed.  ?Constitutional:   ?   Appearance: Normal appearance. She is normal weight.  ?HENT:  ?   Head: Normocephalic.  ?Pulmonary:  ?   Effort: Pulmonary effort is normal.  ?Skin: ?   General: Skin is warm and dry.  ?Neurological:  ?   Mental Status: She is alert and  oriented to person, place, and time. Mental status is at baseline.  ?Psychiatric:     ?   Mood and Affect: Mood normal.     ?   Behavior: Behavior normal.     ?   Thought Content: Thought content normal.  ? ? ?There were no vitals taken for this visit. ?Wt Readings from Last 3 Encounters:  ?09/25/21 102 lb 9.6 oz (46.5 kg)  ?08/21/21 105 lb (47.6 kg)  ?03/19/21 102 lb 12.8 oz (46.6 kg)  ? ? ? ?Health Maintenance Due  ?Topic Date Due  ? INFLUENZA VACCINE  03/03/2021  ? COVID-19 Vaccine (5 - Booster for Moderna series) 03/07/2021  ? ? ?There are no preventive care reminders to display for this patient. ? ?Lab Results  ?Component Value Date  ? TSH 0.58 08/21/2021  ? ?Lab Results  ?Component Value Date  ? WBC 5.7 09/09/2021  ? HGB 13.8 09/09/2021  ? HCT 40 09/09/2021  ? MCV 90.3 08/21/2021  ? PLT 297 09/09/2021  ? ?Lab Results  ?Component Value Date  ? NA 142 09/09/2021  ? K 4.6 09/09/2021  ? CO2 22 09/09/2021  ? GLUCOSE 83 03/13/2021  ? BUN 11 09/09/2021  ? CREATININE 0.8 09/09/2021  ? BILITOT 0.6 03/13/2021  ? ALKPHOS 63 09/09/2021  ? AST 15 09/09/2021  ? ALT 12 09/09/2021  ? PROT 7.1 03/13/2021  ? ALBUMIN 5.1 (A) 09/09/2021  ? CALCIUM 10.1 09/09/2021  ? ANIONGAP 5 10/25/2020  ? EGFR 75 03/13/2021  ? ?Lab Results  ?Component Value Date  ? CHOL 192 03/13/2021  ? ?Lab Results  ?Component Value Date  ? HDL 64 03/13/2021  ? ?Lab Results  ?Component Value Date  ? LDLCALC 112 (H) 03/13/2021  ? ?Lab Results  ?Component Value Date  ? TRIG 69 03/13/2021  ? ?Lab Results  ?Component Value Date  ? CHOLHDL 3.0 03/13/2021  ? ?Lab Results  ?Component Value Date  ? HGBA1C 5.2 02/22/2020  ? ? ?  ?Assessment & Plan:  ? ? ?Allergy desensitization therapy ?  ? ?Injection Note: 0.5 mL of serum administered SubQ in left arm as ordered.  ?0.5 mL of serum administered SubQ in right arm as ordered. These are the full doses of the new serums ordered per allergist.  ?No immediate adverse reaction noted.  ?Pt declined to stay the thirty minutes  to monitor. She has epi-pen with her. Will notify if any problems ? ?Follow-up:  ?1. Continue to monitor for s/s allergic reaction. Carry epi-pen at all times. Notify if any  problems. ?Continue taking PO allergy regimen.  ?RTC in 1 week for allergy serum injections.  ? ?Drucilla Chalet, NP ? ?HPI ?

## 2021-10-23 ENCOUNTER — Ambulatory Visit: Payer: PRIVATE HEALTH INSURANCE | Admitting: Nurse Practitioner

## 2021-10-30 ENCOUNTER — Ambulatory Visit: Payer: PRIVATE HEALTH INSURANCE | Admitting: Nurse Practitioner

## 2021-11-03 ENCOUNTER — Other Ambulatory Visit: Payer: Self-pay | Admitting: Internal Medicine

## 2021-11-03 DIAGNOSIS — I10 Essential (primary) hypertension: Secondary | ICD-10-CM

## 2021-11-06 ENCOUNTER — Ambulatory Visit: Payer: Self-pay | Admitting: Nurse Practitioner

## 2021-11-06 DIAGNOSIS — Z516 Encounter for desensitization to allergens: Secondary | ICD-10-CM

## 2021-11-06 NOTE — Progress Notes (Signed)
?Established Patient Office Visit ? ?Subjective:  ?Patient ID: Susan Mason, female    DOB: [birth year]  Age: 61 y.o. MRN: [id number] ? ?CC: History of allergic asthma ? ? ?HPI ?Susan Mason presents for allergy injections. She is feeling well today with no concerns regarding her allergy shots. She skipped her serum injections because she had a Derm and GI appointment and was unable to make it to the clinic.  ? ?Past Medical History:  ?Diagnosis Date  ? Abnormal Pap smear 2008  ? ASCUS  ? High blood pressure   ? Metrorrhagia   ? Osteopenia   ? Ovarian cyst   ? Yeast vaginitis   ? ? ?Past Surgical History:  ?Procedure Laterality Date  ? COLPOSCOPY  2008  ? neg bx  ? ? ?Family History  ?Problem Relation Age of Onset  ? Hyperlipidemia Mother   ? Hypertension Mother   ? Heart Problems Mother   ? Kidney disease Mother   ? Lung cancer Father   ? Emphysema Father   ? Breast cancer Neg Hx   ? ? ?Social History  ? ?Socioeconomic History  ? Marital status: Married  ?  Spouse name: Not on file  ? Number of children: Not on file  ? Years of education: Not on file  ? Highest education level: Not on file  ?Occupational History  ? Not on file  ?Tobacco Use  ? Smoking status: Never  ? Smokeless tobacco: Never  ?Vaping Use  ? Vaping Use: Never used  ?Substance and Sexual Activity  ? Alcohol use: No  ? Drug use: No  ? Sexual activity: Not on file  ?Other Topics Concern  ? Not on file  ?Social History Narrative  ? Lives at home with husband and mother   ? ?Social Determinants of Health  ? ?Financial Resource Strain: Not on file  ?Food Insecurity: Not on file  ?Transportation Needs: Not on file  ?Physical Activity: Not on file  ?Stress: Not on file  ?Social Connections: Not on file  ?Intimate Partner Violence: Not on file  ? ? ?Outpatient Medications Prior to Visit  ?Medication Sig Dispense Refill  ? azelastine (OPTIVAR) 0.05 % ophthalmic solution Apply 1 drop to eye daily. 6 mL 2  ? cetirizine (ZYRTEC) 10 MG tablet Take 10 mg by mouth  daily.     ? cholecalciferol (VITAMIN D) 1000 UNITS tablet Take 1,000 Units by mouth daily.    ? famotidine (PEPCID) 20 MG tablet Take 1 tablet (20 mg total) by mouth 2 (two) times daily. 60 tablet 1  ? fexofenadine-pseudoephedrine (ALLEGRA-D 24) 180-240 MG 24 hr tablet Take 1 tablet by mouth daily.    ? lisinopril (ZESTRIL) 5 MG tablet TAKE ONE TABLET BY MOUTH DAILY 30 tablet 1  ? Multiple Vitamins-Minerals (MULTIVITAMIN WITH MINERALS) tablet Take 1 tablet by mouth daily.    ? ondansetron (ZOFRAN-ODT) 8 MG disintegrating tablet Take 8 mg by mouth 3 (three) times daily.    ? Probiotic Product (PROBIOTIC-10 PO) Take 1 tablet by mouth daily.    ? ?No facility-administered medications prior to visit.  ? ? ?Allergies  ?Allergen Reactions  ? Clindamycin Rash  ? Nsaids Other (See Comments)  ? Penicillins   ? Doxycycline   ? Doxycycline Calcium Hives  ? Erythromycin   ? Erythromycin Base   ? ? ?ROS ?Review of Systems  ?HENT:  Negative for congestion, ear discharge, sinus pressure and sinus pain.   ?Eyes:  Negative for discharge, redness and itching.  ?  Respiratory:  Negative for shortness of breath and wheezing.   ?Cardiovascular:  Negative for chest pain.  ?Allergic/Immunologic: Positive for environmental allergies.  ?Neurological:  Negative for dizziness and headaches.  ?All other systems reviewed and are negative. ? ?  ?Objective:  ?  ?Physical Exam ?Vitals reviewed.  ?Constitutional:   ?   Appearance: Normal appearance. She is normal weight.  ?HENT:  ?   Head: Normocephalic.  ?Pulmonary:  ?   Effort: Pulmonary effort is normal.  ?Skin: ?   General: Skin is warm and dry.  ?Neurological:  ?   Mental Status: She is alert and oriented to person, place, and time. Mental status is at baseline.  ?Psychiatric:     ?   Mood and Affect: Mood normal.     ?   Behavior: Behavior normal.     ?   Thought Content: Thought content normal.  ? ? ?There were no vitals taken for this visit. ?Wt Readings from Last 3 Encounters:  ?09/25/21  102 lb 9.6 oz (46.5 kg)  ?08/21/21 105 lb (47.6 kg)  ?03/19/21 102 lb 12.8 oz (46.6 kg)  ? ? ? ?Health Maintenance Due  ?Topic Date Due  ? COVID-19 Vaccine (5 - Booster for Moderna series) 03/07/2021  ? ? ?There are no preventive care reminders to display for this patient. ? ?Lab Results  ?Component Value Date  ? TSH 0.58 08/21/2021  ? ?Lab Results  ?Component Value Date  ? WBC 5.7 09/09/2021  ? HGB 13.8 09/09/2021  ? HCT 40 09/09/2021  ? MCV 90.3 08/21/2021  ? PLT 297 09/09/2021  ? ?Lab Results  ?Component Value Date  ? NA 142 09/09/2021  ? K 4.6 09/09/2021  ? CO2 22 09/09/2021  ? GLUCOSE 83 03/13/2021  ? BUN 11 09/09/2021  ? CREATININE 0.8 09/09/2021  ? BILITOT 0.6 03/13/2021  ? ALKPHOS 63 09/09/2021  ? AST 15 09/09/2021  ? ALT 12 09/09/2021  ? PROT 7.1 03/13/2021  ? ALBUMIN 5.1 (A) 09/09/2021  ? CALCIUM 10.1 09/09/2021  ? ANIONGAP 5 10/25/2020  ? EGFR 75 03/13/2021  ? ?Lab Results  ?Component Value Date  ? CHOL 192 03/13/2021  ? ?Lab Results  ?Component Value Date  ? HDL 64 03/13/2021  ? ?Lab Results  ?Component Value Date  ? LDLCALC 112 (H) 03/13/2021  ? ?Lab Results  ?Component Value Date  ? TRIG 69 03/13/2021  ? ?Lab Results  ?Component Value Date  ? CHOLHDL 3.0 03/13/2021  ? ?Lab Results  ?Component Value Date  ? HGBA1C 5.2 02/22/2020  ? ? ?  ?Assessment & Plan:  ? ? ?Allergy desensitization therapy ?  ? ?Injection Note: 0.5 mL of serum administered SubQ in left arm as ordered.  ?0.5 mL of serum administered SubQ in right arm as ordered. These are the full doses of the new serums ordered per allergist.  ?No immediate adverse reaction noted.  ?Pt declined to stay the thirty minutes to monitor. She has epi-pen with her. Will notify if any problems ? ?Follow-up:  ?1. Continue to monitor for s/s allergic reaction. Carry epi-pen at all times. Notify if any problems. ?Continue taking PO allergy regimen.  ?RTC in 1 week for allergy serum injections.  ? ?Drucilla Chalet, NP ? ?HPI ?

## 2021-11-13 ENCOUNTER — Ambulatory Visit: Payer: Self-pay | Admitting: Nurse Practitioner

## 2021-11-13 DIAGNOSIS — Z516 Encounter for desensitization to allergens: Secondary | ICD-10-CM

## 2021-11-13 NOTE — Progress Notes (Signed)
?Established Patient Office Visit ? ?Subjective:  ?Patient ID: Susan Mason, female    DOB: 12-13-1960  Age: 61 y.o. MRN: [id number] ? ?CC: History of allergic asthma ? ? ?HPI ?Susan Mason presents for allergy injections. She is feeling well today with no concerns regarding her allergy shots. She had no reactions to her injections last week.  ? ?She will be out of work next Thursday and will not be coming to the clinic for her weekly allergy shots.  ? ?Past Medical History:  ?Diagnosis Date  ? Abnormal Pap smear 2008  ? ASCUS  ? High blood pressure   ? Metrorrhagia   ? Osteopenia   ? Ovarian cyst   ? Yeast vaginitis   ? ? ?Past Surgical History:  ?Procedure Laterality Date  ? COLPOSCOPY  2008  ? neg bx  ? ? ?Family History  ?Problem Relation Age of Onset  ? Hyperlipidemia Mother   ? Hypertension Mother   ? Heart Problems Mother   ? Kidney disease Mother   ? Lung cancer Father   ? Emphysema Father   ? Breast cancer Neg Hx   ? ? ?Social History  ? ?Socioeconomic History  ? Marital status: Married  ?  Spouse name: Not on file  ? Number of children: Not on file  ? Years of education: Not on file  ? Highest education level: Not on file  ?Occupational History  ? Not on file  ?Tobacco Use  ? Smoking status: Never  ? Smokeless tobacco: Never  ?Vaping Use  ? Vaping Use: Never used  ?Substance and Sexual Activity  ? Alcohol use: No  ? Drug use: No  ? Sexual activity: Not on file  ?Other Topics Concern  ? Not on file  ?Social History Narrative  ? Lives at home with husband and mother   ? ?Social Determinants of Health  ? ?Financial Resource Strain: Not on file  ?Food Insecurity: Not on file  ?Transportation Needs: Not on file  ?Physical Activity: Not on file  ?Stress: Not on file  ?Social Connections: Not on file  ?Intimate Partner Violence: Not on file  ? ? ?Outpatient Medications Prior to Visit  ?Medication Sig Dispense Refill  ? azelastine (OPTIVAR) 0.05 % ophthalmic solution Apply 1 drop to eye daily. 6 mL 2  ?  cetirizine (ZYRTEC) 10 MG tablet Take 10 mg by mouth daily.     ? cholecalciferol (VITAMIN D) 1000 UNITS tablet Take 1,000 Units by mouth daily.    ? famotidine (PEPCID) 20 MG tablet Take 1 tablet (20 mg total) by mouth 2 (two) times daily. 60 tablet 1  ? fexofenadine-pseudoephedrine (ALLEGRA-D 24) 180-240 MG 24 hr tablet Take 1 tablet by mouth daily.    ? lisinopril (ZESTRIL) 5 MG tablet TAKE ONE TABLET BY MOUTH DAILY 30 tablet 1  ? Multiple Vitamins-Minerals (MULTIVITAMIN WITH MINERALS) tablet Take 1 tablet by mouth daily.    ? ondansetron (ZOFRAN-ODT) 8 MG disintegrating tablet Take 8 mg by mouth 3 (three) times daily.    ? Probiotic Product (PROBIOTIC-10 PO) Take 1 tablet by mouth daily.    ? ?No facility-administered medications prior to visit.  ? ? ?Allergies  ?Allergen Reactions  ? Clindamycin Rash  ? Nsaids Other (See Comments)  ? Penicillins   ? Doxycycline   ? Doxycycline Calcium Hives  ? Erythromycin   ? Erythromycin Base   ? ? ?ROS ?Review of Systems  ?HENT:  Negative for congestion, ear discharge, sinus pressure and sinus pain.   ?  Eyes:  Negative for discharge, redness and itching.  ?Respiratory:  Negative for shortness of breath and wheezing.   ?Cardiovascular:  Negative for chest pain.  ?Allergic/Immunologic: Positive for environmental allergies.  ?Neurological:  Negative for dizziness and headaches.  ?All other systems reviewed and are negative. ? ?  ?Objective:  ?  ?Physical Exam ?Vitals reviewed.  ?Constitutional:   ?   Appearance: Normal appearance. She is normal weight.  ?HENT:  ?   Head: Normocephalic.  ?Pulmonary:  ?   Effort: Pulmonary effort is normal.  ?Skin: ?   General: Skin is warm and dry.  ?Neurological:  ?   Mental Status: She is alert and oriented to person, place, and time. Mental status is at baseline.  ?Psychiatric:     ?   Mood and Affect: Mood normal.     ?   Behavior: Behavior normal.     ?   Thought Content: Thought content normal.  ? ? ?There were no vitals taken for this  visit. ?Wt Readings from Last 3 Encounters:  ?09/25/21 102 lb 9.6 oz (46.5 kg)  ?08/21/21 105 lb (47.6 kg)  ?03/19/21 102 lb 12.8 oz (46.6 kg)  ? ? ? ?Health Maintenance Due  ?Topic Date Due  ? COVID-19 Vaccine (5 - Booster for Moderna series) 03/07/2021  ? ? ?There are no preventive care reminders to display for this patient. ? ?Lab Results  ?Component Value Date  ? TSH 0.58 08/21/2021  ? ?Lab Results  ?Component Value Date  ? WBC 5.7 09/09/2021  ? HGB 13.8 09/09/2021  ? HCT 40 09/09/2021  ? MCV 90.3 08/21/2021  ? PLT 297 09/09/2021  ? ?Lab Results  ?Component Value Date  ? NA 142 09/09/2021  ? K 4.6 09/09/2021  ? CO2 22 09/09/2021  ? GLUCOSE 83 03/13/2021  ? BUN 11 09/09/2021  ? CREATININE 0.8 09/09/2021  ? BILITOT 0.6 03/13/2021  ? ALKPHOS 63 09/09/2021  ? AST 15 09/09/2021  ? ALT 12 09/09/2021  ? PROT 7.1 03/13/2021  ? ALBUMIN 5.1 (A) 09/09/2021  ? CALCIUM 10.1 09/09/2021  ? ANIONGAP 5 10/25/2020  ? EGFR 75 03/13/2021  ? ?Lab Results  ?Component Value Date  ? CHOL 192 03/13/2021  ? ?Lab Results  ?Component Value Date  ? HDL 64 03/13/2021  ? ?Lab Results  ?Component Value Date  ? LDLCALC 112 (H) 03/13/2021  ? ?Lab Results  ?Component Value Date  ? TRIG 69 03/13/2021  ? ?Lab Results  ?Component Value Date  ? CHOLHDL 3.0 03/13/2021  ? ?Lab Results  ?Component Value Date  ? HGBA1C 5.2 02/22/2020  ? ? ?  ?Assessment & Plan:  ? ? ?Allergy desensitization therapy ?  ? ?Injection Note: 0.5 mL of serum administered SubQ in left arm as ordered.  ?0.5 mL of serum administered SubQ in right arm as ordered. These are the full doses of the new serums ordered per allergist.  ?No immediate adverse reaction noted.  ?Pt declined to stay the thirty minutes to monitor. She has epi-pen with her. Will notify if any problems ? ?Follow-up:  ?1. Continue to monitor for s/s allergic reaction. Carry epi-pen at all times. Notify if any problems. ?Continue taking PO allergy regimen.  ?RTC in 2 weeks for allergy serum injections.  ? ?Drucilla Chalet, NP ? ?HPI ?

## 2021-11-27 ENCOUNTER — Ambulatory Visit: Payer: PRIVATE HEALTH INSURANCE

## 2021-11-27 DIAGNOSIS — Z516 Encounter for desensitization to allergens: Secondary | ICD-10-CM

## 2021-12-04 ENCOUNTER — Ambulatory Visit: Payer: PRIVATE HEALTH INSURANCE

## 2021-12-11 ENCOUNTER — Ambulatory Visit: Payer: PRIVATE HEALTH INSURANCE | Admitting: Family

## 2021-12-11 DIAGNOSIS — Z516 Encounter for desensitization to allergens: Secondary | ICD-10-CM

## 2021-12-11 NOTE — Progress Notes (Signed)
?Established Patient Office Visit ? ?Subjective:  ?Patient ID: Susan Mason, female    DOB: [birth year]  Age: 61 y.o. MRN: [id number] ? ?CC: History of allergic asthma ? ? ?HPI ?Susan Mason presents for allergy injections. She is feeling well today with no concerns regarding her allergy shots. She had no reactions to her injections last week.  ? ?Past Medical History:  ?Diagnosis Date  ? Abnormal Pap smear 2008  ? ASCUS  ? High blood pressure   ? Metrorrhagia   ? Osteopenia   ? Ovarian cyst   ? Yeast vaginitis   ? ? ?Past Surgical History:  ?Procedure Laterality Date  ? COLPOSCOPY  2008  ? neg bx  ? ? ?Family History  ?Problem Relation Age of Onset  ? Hyperlipidemia Mother   ? Hypertension Mother   ? Heart Problems Mother   ? Kidney disease Mother   ? Lung cancer Father   ? Emphysema Father   ? Breast cancer Neg Hx   ? ? ?Social History  ? ?Socioeconomic History  ? Marital status: Married  ?  Spouse name: Not on file  ? Number of children: Not on file  ? Years of education: Not on file  ? Highest education level: Not on file  ?Occupational History  ? Not on file  ?Tobacco Use  ? Smoking status: Never  ? Smokeless tobacco: Never  ?Vaping Use  ? Vaping Use: Never used  ?Substance and Sexual Activity  ? Alcohol use: No  ? Drug use: No  ? Sexual activity: Not on file  ?Other Topics Concern  ? Not on file  ?Social History Narrative  ? Lives at home with husband and mother   ? ?Social Determinants of Health  ? ?Financial Resource Strain: Not on file  ?Food Insecurity: Not on file  ?Transportation Needs: Not on file  ?Physical Activity: Not on file  ?Stress: Not on file  ?Social Connections: Not on file  ?Intimate Partner Violence: Not on file  ? ? ?Outpatient Medications Prior to Visit  ?Medication Sig Dispense Refill  ? azelastine (OPTIVAR) 0.05 % ophthalmic solution Apply 1 drop to eye daily. 6 mL 2  ? cetirizine (ZYRTEC) 10 MG tablet Take 10 mg by mouth daily.     ? cholecalciferol (VITAMIN D) 1000 UNITS tablet Take  1,000 Units by mouth daily.    ? famotidine (PEPCID) 20 MG tablet Take 1 tablet (20 mg total) by mouth 2 (two) times daily. 60 tablet 1  ? fexofenadine-pseudoephedrine (ALLEGRA-D 24) 180-240 MG 24 hr tablet Take 1 tablet by mouth daily.    ? lisinopril (ZESTRIL) 5 MG tablet TAKE ONE TABLET BY MOUTH DAILY 30 tablet 1  ? Multiple Vitamins-Minerals (MULTIVITAMIN WITH MINERALS) tablet Take 1 tablet by mouth daily.    ? ondansetron (ZOFRAN-ODT) 8 MG disintegrating tablet Take 8 mg by mouth 3 (three) times daily.    ? Probiotic Product (PROBIOTIC-10 PO) Take 1 tablet by mouth daily.    ? ?No facility-administered medications prior to visit.  ? ? ?Allergies  ?Allergen Reactions  ? Clindamycin Rash  ? Nsaids Other (See Comments)  ? Penicillins   ? Doxycycline   ? Doxycycline Calcium Hives  ? Erythromycin   ? Erythromycin Base   ? ? ?ROS ?Review of Systems  ?HENT:  Negative for congestion, ear discharge, sinus pressure and sinus pain.   ?Eyes:  Negative for discharge, redness and itching.  ?Respiratory:  Negative for shortness of breath and wheezing.   ?Cardiovascular:  Negative for chest pain.  ?Allergic/Immunologic: Positive for environmental allergies.  ?Neurological:  Negative for dizziness and headaches.  ?All other systems reviewed and are negative. ? ?  ?Objective:  ?  ?Physical Exam ?Vitals reviewed.  ?Constitutional:   ?   Appearance: Normal appearance. She is normal weight.  ?HENT:  ?   Head: Normocephalic.  ?Pulmonary:  ?   Effort: Pulmonary effort is normal.  ?Skin: ?   General: Skin is warm and dry.  ?Neurological:  ?   Mental Status: She is alert and oriented to person, place, and time. Mental status is at baseline.  ?Psychiatric:     ?   Mood and Affect: Mood normal.     ?   Behavior: Behavior normal.     ?   Thought Content: Thought content normal.  ? ? ?There were no vitals taken for this visit. ?Wt Readings from Last 3 Encounters:  ?09/25/21 102 lb 9.6 oz (46.5 kg)  ?08/21/21 105 lb (47.6 kg)  ?03/19/21 102  lb 12.8 oz (46.6 kg)  ? ? ? ?Health Maintenance Due  ?Topic Date Due  ? COVID-19 Vaccine (5 - Booster for Moderna series) 03/07/2021  ? ? ?There are no preventive care reminders to display for this patient. ? ?Lab Results  ?Component Value Date  ? TSH 0.58 08/21/2021  ? ?Lab Results  ?Component Value Date  ? WBC 5.7 09/09/2021  ? HGB 13.8 09/09/2021  ? HCT 40 09/09/2021  ? MCV 90.3 08/21/2021  ? PLT 297 09/09/2021  ? ?Lab Results  ?Component Value Date  ? NA 142 09/09/2021  ? K 4.6 09/09/2021  ? CO2 22 09/09/2021  ? GLUCOSE 83 03/13/2021  ? BUN 11 09/09/2021  ? CREATININE 0.8 09/09/2021  ? BILITOT 0.6 03/13/2021  ? ALKPHOS 63 09/09/2021  ? AST 15 09/09/2021  ? ALT 12 09/09/2021  ? PROT 7.1 03/13/2021  ? ALBUMIN 5.1 (A) 09/09/2021  ? CALCIUM 10.1 09/09/2021  ? ANIONGAP 5 10/25/2020  ? EGFR 75 03/13/2021  ? ?Lab Results  ?Component Value Date  ? CHOL 192 03/13/2021  ? ?Lab Results  ?Component Value Date  ? HDL 64 03/13/2021  ? ?Lab Results  ?Component Value Date  ? LDLCALC 112 (H) 03/13/2021  ? ?Lab Results  ?Component Value Date  ? TRIG 69 03/13/2021  ? ?Lab Results  ?Component Value Date  ? CHOLHDL 3.0 03/13/2021  ? ?Lab Results  ?Component Value Date  ? HGBA1C 5.2 02/22/2020  ? ? ?  ?Assessment & Plan:  ? ? ?Allergy desensitization therapy ?  ? ?Injection Note: 0.5 mL of serum administered SubQ in left arm as ordered.  ?0.5 mL of serum administered SubQ in right arm as ordered. These are the full doses of the new serums ordered per allergist.  ?No immediate adverse reaction noted.  ?Pt declined to stay the thirty minutes to monitor. She has epi-pen with her. Will notify if any problems ? ?Follow-up:  ?1. Continue to monitor for s/s allergic reaction. Carry epi-pen at all times. Notify if any problems. ?Continue taking PO allergy regimen.  ?RTC in 1 weeks for allergy serum injections.  ? ?Jerre Simon, NP ? ?HPI ?

## 2021-12-18 ENCOUNTER — Ambulatory Visit: Payer: PRIVATE HEALTH INSURANCE | Admitting: Physician Assistant

## 2021-12-18 ENCOUNTER — Encounter: Payer: Self-pay | Admitting: Physician Assistant

## 2021-12-18 DIAGNOSIS — Z516 Encounter for desensitization to allergens: Secondary | ICD-10-CM

## 2021-12-18 NOTE — Progress Notes (Signed)
Patient ID: Susan Mason, female   DOB: [birth year], 61 y.o.   MRN: [id number]  61 yo WF presents for routine weekly allergy shots Longstanding therapy x years  [facility]place clinic site Followed by PCP Dr Allyne Gee locally Allergy Consultation [facility] Worksheet  and vials in hand  Alert and interactive, pleasant ,  ambulatory without  obvious limitation Respirations regular even unlabored Reports feels well, doing well  Patient presents Allergy extract and instructions As directed patient is given 0.53ml  #1 SubQ  Left Upper posterior, well tolerated Followed by 0.19ml # 2 Sub Q  Right Upper posterior, also well tolerated  Remained in exam room for conversation and observation Denied concerns, exhibited no reaction to Rx. Denies discomfort injection site Ambulated with patient to discharge, appears relaxed And comfortable

## 2021-12-18 NOTE — Addendum Note (Signed)
Addended by: Rae Halsted on: 12/18/2021 10:20 AM   Modules accepted: Level of Service

## 2021-12-18 NOTE — Progress Notes (Deleted)
  There were no vitals taken for this visit. {Vitals History (Optional):23777}  Physical Exam   No results found for any visits on 12/18/21.  {Labs (Optional):23779}  The 10-year ASCVD risk score (Arnett DK, et al., 2019) is: 9.7%    Assessment & Plan:   Problem List Items Addressed This Visit   None   No follow-ups on file.    Rae Halsted, PA-C

## 2021-12-25 ENCOUNTER — Ambulatory Visit: Payer: PRIVATE HEALTH INSURANCE | Admitting: Family

## 2021-12-25 ENCOUNTER — Encounter: Payer: Self-pay | Admitting: Family

## 2021-12-25 DIAGNOSIS — Z516 Encounter for desensitization to allergens: Secondary | ICD-10-CM

## 2021-12-25 NOTE — Progress Notes (Signed)
Established Patient Office Visit  Subjective:  Patient ID: Susan Mason, female    DOB: May 31, 1961  Age: 61 y.o. MRN: [id number]  CC: History of allergic asthma   HPI Susan Mason presents for allergy injections. She is feeling well today with no concerns regarding her allergy shots. She had no reactions to her injections last week.   Past Medical History:  Diagnosis Date   Abnormal Pap smear 2008   ASCUS   High blood pressure    Metrorrhagia    Osteopenia    Ovarian cyst    Yeast vaginitis     Past Surgical History:  Procedure Laterality Date   COLPOSCOPY  2008   neg bx    Family History  Problem Relation Age of Onset   Hyperlipidemia Mother    Hypertension Mother    Heart Problems Mother    Kidney disease Mother    Lung cancer Father    Emphysema Father    Breast cancer Neg Hx     Social History   Socioeconomic History   Marital status: Married    Spouse name: Not on file   Number of children: Not on file   Years of education: Not on file   Highest education level: Not on file  Occupational History   Not on file  Tobacco Use   Smoking status: Never   Smokeless tobacco: Never  Vaping Use   Vaping Use: Never used  Substance and Sexual Activity   Alcohol use: No   Drug use: No   Sexual activity: Not on file  Other Topics Concern   Not on file  Social History Narrative   Lives at home with husband and mother    Social Determinants of Health   Financial Resource Strain: Not on file  Food Insecurity: Not on file  Transportation Needs: Not on file  Physical Activity: Not on file  Stress: Not on file  Social Connections: Not on file  Intimate Partner Violence: Not on file    Outpatient Medications Prior to Visit  Medication Sig Dispense Refill   azelastine (OPTIVAR) 0.05 % ophthalmic solution Apply 1 drop to eye daily. 6 mL 2   cetirizine (ZYRTEC) 10 MG tablet Take 10 mg by mouth daily.      cholecalciferol (VITAMIN D) 1000 UNITS tablet Take  1,000 Units by mouth daily.     famotidine (PEPCID) 20 MG tablet Take 1 tablet (20 mg total) by mouth 2 (two) times daily. 60 tablet 1   fexofenadine-pseudoephedrine (ALLEGRA-D 24) 180-240 MG 24 hr tablet Take 1 tablet by mouth daily.     lisinopril (ZESTRIL) 5 MG tablet TAKE ONE TABLET BY MOUTH DAILY 30 tablet 1   Multiple Vitamins-Minerals (MULTIVITAMIN WITH MINERALS) tablet Take 1 tablet by mouth daily.     ondansetron (ZOFRAN-ODT) 8 MG disintegrating tablet Take 8 mg by mouth 3 (three) times daily.     Probiotic Product (PROBIOTIC-10 PO) Take 1 tablet by mouth daily.     No facility-administered medications prior to visit.    Allergies  Allergen Reactions   Clindamycin Rash   Nsaids Other (See Comments)   Penicillins    Doxycycline    Doxycycline Calcium Hives   Erythromycin    Erythromycin Base     ROS Review of Systems  HENT:  Negative for congestion.   Eyes:  Negative for redness and itching.  Allergic/Immunologic: Positive for environmental allergies.  Neurological:  Negative for dizziness.  All other systems reviewed and are negative.  Objective:    Physical Exam Vitals reviewed.  Constitutional:      Appearance: Normal appearance. She is normal weight.  HENT:     Head: Normocephalic.  Pulmonary:     Effort: Pulmonary effort is normal.  Skin:    General: Skin is warm and dry.  Neurological:     Mental Status: She is alert and oriented to person, place, and time. Mental status is at baseline.  Psychiatric:        Mood and Affect: Mood normal.        Behavior: Behavior normal.        Thought Content: Thought content normal.    There were no vitals taken for this visit. Wt Readings from Last 3 Encounters:  09/25/21 102 lb 9.6 oz (46.5 kg)  08/21/21 105 lb (47.6 kg)  03/19/21 102 lb 12.8 oz (46.6 kg)     Health Maintenance Due  Topic Date Due   Zoster Vaccines- Shingrix (1 of 2) Never done   COVID-19 Vaccine (5 - Booster for Moderna series)  03/07/2021    There are no preventive care reminders to display for this patient.  Lab Results  Component Value Date   TSH 0.58 08/21/2021   Lab Results  Component Value Date   WBC 5.7 09/09/2021   HGB 13.8 09/09/2021   HCT 40 09/09/2021   MCV 90.3 08/21/2021   PLT 297 09/09/2021   Lab Results  Component Value Date   NA 142 09/09/2021   K 4.6 09/09/2021   CO2 22 09/09/2021   GLUCOSE 83 03/13/2021   BUN 11 09/09/2021   CREATININE 0.8 09/09/2021   BILITOT 0.6 03/13/2021   ALKPHOS 63 09/09/2021   AST 15 09/09/2021   ALT 12 09/09/2021   PROT 7.1 03/13/2021   ALBUMIN 5.1 (A) 09/09/2021   CALCIUM 10.1 09/09/2021   ANIONGAP 5 10/25/2020   EGFR 75 03/13/2021   Lab Results  Component Value Date   CHOL 192 03/13/2021   Lab Results  Component Value Date   HDL 64 03/13/2021   Lab Results  Component Value Date   LDLCALC 112 (H) 03/13/2021   Lab Results  Component Value Date   TRIG 69 03/13/2021   Lab Results  Component Value Date   CHOLHDL 3.0 03/13/2021   Lab Results  Component Value Date   HGBA1C 5.2 02/22/2020      Assessment & Plan:    Allergy desensitization therapy    Injection Note: 0.5 mL of serum administered SubQ in left arm as ordered.  0.5 mL of serum administered SubQ in right arm as ordered. These are the full doses of the new serums ordered per allergist.  No immediate adverse reaction noted.  Pt declined to stay the thirty minutes to monitor. She has epi-pen with her. Will notify if any problems  Follow-up:  1. Continue to monitor for s/s allergic reaction. Carry epi-pen at all times. Notify if any problems. Continue taking PO allergy regimen.  RTC in 1 weeks for allergy serum injections.   Jerre Simon, NP  HPI

## 2021-12-30 ENCOUNTER — Ambulatory Visit: Payer: PRIVATE HEALTH INSURANCE | Admitting: Family

## 2022-01-01 ENCOUNTER — Other Ambulatory Visit: Payer: Self-pay | Admitting: Internal Medicine

## 2022-01-01 ENCOUNTER — Ambulatory Visit: Payer: PRIVATE HEALTH INSURANCE | Admitting: Nurse Practitioner

## 2022-01-01 DIAGNOSIS — I10 Essential (primary) hypertension: Secondary | ICD-10-CM

## 2022-01-01 DIAGNOSIS — Z516 Encounter for desensitization to allergens: Secondary | ICD-10-CM

## 2022-01-01 NOTE — Progress Notes (Signed)
  Established Patient Office Visit  Subjective   Patient ID: Susan Mason, female    DOB: 1961/06/11  Age: 61 y.o. MRN: [id number]  Chief Complaint  Patient presents with   Hx of allergies   Patient presents today for allergy injections. Reports she has taken allergy shots for several years and obtains serum from her allergist at [facility]. She had no reaction to her previous injections last week. She has epi-pen available but states she has never needed to use it. She has no concerns at this time.  Patient mentions she will be retiring tomorrow and will no longer be returning to clinic for future allergy shots.  Past Medical History:  Diagnosis Date   Abnormal Pap smear 2008   ASCUS   High blood pressure    Metrorrhagia    Osteopenia    Ovarian cyst    Yeast vaginitis    Past Surgical History:  Procedure Laterality Date   COLPOSCOPY  2008   neg bx   Allergies  Allergen Reactions   Clindamycin Rash   Nsaids Other (See Comments)   Penicillins    Doxycycline    Doxycycline Calcium Hives   Erythromycin    Erythromycin Base     Review of Systems  HENT:  Negative for congestion.   Respiratory:  Negative for shortness of breath.   Cardiovascular:  Negative for chest pain.  Skin:  Negative for itching and rash.  Neurological:  Negative for dizziness.  Endo/Heme/Allergies:  Positive for environmental allergies.     Objective:     There were no vitals taken for this visit. BP Readings from Last 3 Encounters:  09/25/21 114/78  08/21/21 122/74  07/03/21 118/68      Physical Exam Constitutional:      General: She is not in acute distress. HENT:     Head: Normocephalic.  Pulmonary:     Effort: Pulmonary effort is normal.  Musculoskeletal:        General: Normal range of motion.  Skin:    General: Skin is warm and dry.  Neurological:     General: No focal deficit present.     Mental Status: She is alert and oriented to person, place, and time.      Assessment  & Plan:   Problem List Items Addressed This Visit       Other   Allergy desensitization therapy - Primary   0.5 ml of serum was administered subcutaneously to left arm  0.5 ml of serum was administered subcutaneously to right arm.  Doses as order by patient's allergist. No immediate reaction noted.  Patient declined to stay 30 minutes for monitoring. She was instructed to notify if any problems or s/s of allergic reaction. Patient has epi-pen with her.   Return if symptoms worsen or fail to improve.    Lurena Joiner, NP

## 2022-02-19 ENCOUNTER — Encounter: Payer: Self-pay | Admitting: Internal Medicine

## 2022-03-03 ENCOUNTER — Other Ambulatory Visit: Payer: Self-pay | Admitting: Internal Medicine

## 2022-03-03 DIAGNOSIS — I10 Essential (primary) hypertension: Secondary | ICD-10-CM

## 2022-03-11 ENCOUNTER — Other Ambulatory Visit: Payer: Self-pay | Admitting: Internal Medicine

## 2022-03-11 DIAGNOSIS — Z1231 Encounter for screening mammogram for malignant neoplasm of breast: Secondary | ICD-10-CM

## 2022-03-30 ENCOUNTER — Other Ambulatory Visit: Payer: Self-pay

## 2022-03-30 ENCOUNTER — Other Ambulatory Visit: Payer: PRIVATE HEALTH INSURANCE

## 2022-03-30 DIAGNOSIS — I10 Essential (primary) hypertension: Secondary | ICD-10-CM | POA: Diagnosis not present

## 2022-03-30 DIAGNOSIS — Z0000 Encounter for general adult medical examination without abnormal findings: Secondary | ICD-10-CM

## 2022-03-30 DIAGNOSIS — Z79899 Other long term (current) drug therapy: Secondary | ICD-10-CM

## 2022-03-30 NOTE — Addendum Note (Signed)
Addended by: Nelda Bucks on: 03/30/2022 10:34 AM   Modules accepted: Orders

## 2022-03-31 LAB — CMP14+EGFR
ALT: 11 IU/L (ref 0–32)
AST: 17 IU/L (ref 0–40)
Albumin/Globulin Ratio: 2 (ref 1.2–2.2)
Albumin: 4.4 g/dL (ref 3.9–4.9)
Alkaline Phosphatase: 65 IU/L (ref 44–121)
BUN/Creatinine Ratio: 24 (ref 12–28)
BUN: 21 mg/dL (ref 8–27)
Bilirubin Total: 0.3 mg/dL (ref 0.0–1.2)
CO2: 22 mmol/L (ref 20–29)
Calcium: 9.3 mg/dL (ref 8.7–10.3)
Chloride: 104 mmol/L (ref 96–106)
Creatinine, Ser: 0.87 mg/dL (ref 0.57–1.00)
Globulin, Total: 2.2 g/dL (ref 1.5–4.5)
Glucose: 86 mg/dL (ref 70–99)
Potassium: 4.5 mmol/L (ref 3.5–5.2)
Sodium: 140 mmol/L (ref 134–144)
Total Protein: 6.6 g/dL (ref 6.0–8.5)
eGFR: 75 mL/min/{1.73_m2} (ref >59)

## 2022-03-31 LAB — CBC
Hematocrit: 37.7 % (ref 34.0–46.6)
Hemoglobin: 12.7 g/dL (ref 11.1–15.9)
MCH: 30.4 pg (ref 26.6–33.0)
MCHC: 33.7 g/dL (ref 31.5–35.7)
MCV: 90 fL (ref 79–97)
Platelets: 267 10*3/uL (ref 150–450)
RBC: 4.18 x10E6/uL (ref 3.77–5.28)
RDW: 13.1 % (ref 11.7–15.4)
WBC: 4.1 10*3/uL (ref 3.4–10.8)

## 2022-03-31 LAB — LIPID PANEL
Chol/HDL Ratio: 3.3 ratio (ref 0.0–4.4)
Cholesterol, Total: 193 mg/dL (ref 100–199)
HDL: 59 mg/dL (ref >39)
LDL Chol Calc (NIH): 119 mg/dL — ABNORMAL HIGH (ref 0–99)
Triglycerides: 82 mg/dL (ref 0–149)
VLDL Cholesterol Cal: 15 mg/dL (ref 5–40)

## 2022-03-31 LAB — TSH+FREE T4
Free T4: 1.31 ng/dL (ref 0.82–1.77)
TSH: 0.492 u[IU]/mL (ref 0.450–4.500)

## 2022-04-02 ENCOUNTER — Ambulatory Visit (INDEPENDENT_AMBULATORY_CARE_PROVIDER_SITE_OTHER): Payer: 59 | Admitting: Internal Medicine

## 2022-04-02 ENCOUNTER — Encounter: Payer: Self-pay | Admitting: Internal Medicine

## 2022-04-02 ENCOUNTER — Ambulatory Visit
Admission: RE | Admit: 2022-04-02 | Discharge: 2022-04-02 | Disposition: A | Discharge status: Home / Self Care | Payer: 59 | Source: Ambulatory Visit | Attending: Internal Medicine | Admitting: Internal Medicine

## 2022-04-02 VITALS — BP 110/70 | HR 62 | Temp 98.2°F | Ht 61.0 in | Wt 100.6 lb

## 2022-04-02 DIAGNOSIS — Z2821 Immunization not carried out because of patient refusal: Secondary | ICD-10-CM | POA: Diagnosis not present

## 2022-04-02 DIAGNOSIS — Z0000 Encounter for general adult medical examination without abnormal findings: Secondary | ICD-10-CM | POA: Diagnosis not present

## 2022-04-02 DIAGNOSIS — M85851 Other specified disorders of bone density and structure, right thigh: Secondary | ICD-10-CM

## 2022-04-02 DIAGNOSIS — I10 Essential (primary) hypertension: Secondary | ICD-10-CM

## 2022-04-02 DIAGNOSIS — Z1231 Encounter for screening mammogram for malignant neoplasm of breast: Secondary | ICD-10-CM | POA: Diagnosis not present

## 2022-04-02 LAB — POCT URINALYSIS DIPSTICK
Bilirubin, UA: NEGATIVE
Blood, UA: NEGATIVE
Glucose, UA: NEGATIVE
Ketones, UA: NEGATIVE
Leukocytes, UA: NEGATIVE
Nitrite, UA: NEGATIVE
Protein, UA: NEGATIVE
Spec Grav, UA: 1.025 (ref 1.010–1.025)
Urobilinogen, UA: 0.2 E.U./dL
pH, UA: 6 (ref 5.0–8.0)

## 2022-04-02 MED ORDER — PANTOPRAZOLE SODIUM 40 MG PO TBEC: 40.0000 mg | DELAYED_RELEASE_TABLET | Freq: Every day | ORAL | 1 refills | Status: DC

## 2022-04-02 MED ORDER — LISINOPRIL 5 MG PO TABS: 5.0000 mg | ORAL_TABLET | Freq: Every day | ORAL | 2 refills | Status: DC

## 2022-04-02 NOTE — Patient Instructions (Addendum)
The 10-year ASCVD risk score (Arnett DK, et al., 2019) is: 3.8%   Values used to calculate the score:     Age: 61 years     Sex: Female     Is Non-Hispanic African American: No     Diabetic: No     Tobacco smoker: No     Systolic Blood Pressure: 110 mmHg     Is BP treated: Yes     HDL Cholesterol: 59 mg/dL     Total Cholesterol: 193 mg/dL   Health Maintenance, Female Adopting a healthy lifestyle and getting preventive care are important in promoting health and wellness. Ask your health care provider about: The right schedule for you to have regular tests and exams. Things you can do on your own to prevent diseases and keep yourself healthy. What should I know about diet, weight, and exercise? Eat a healthy diet  Eat a diet that includes plenty of vegetables, fruits, low-fat dairy products, and lean protein. Do not eat a lot of foods that are high in solid fats, added sugars, or sodium. Maintain a healthy weight Body mass index (BMI) is used to identify weight problems. It estimates body fat based on height and weight. Your health care provider can help determine your BMI and help you achieve or maintain a healthy weight. Get regular exercise Get regular exercise. This is one of the most important things you can do for your health. Most adults should: Exercise for at least 150 minutes each week. The exercise should increase your heart rate and make you sweat (moderate-intensity exercise). Do strengthening exercises at least twice a week. This is in addition to the moderate-intensity exercise. Spend less time sitting. Even light physical activity can be beneficial. Watch cholesterol and blood lipids Have your blood tested for lipids and cholesterol at 62 years of age, then have this test every 5 years. Have your cholesterol levels checked more often if: Your lipid or cholesterol levels are high. You are older than 62 years of age. You are at high risk for heart disease. What should I  know about cancer screening? Depending on your health history and family history, you may need to have cancer screening at various ages. This may include screening for: Breast cancer. Cervical cancer. Colorectal cancer. Skin cancer. Lung cancer. What should I know about heart disease, diabetes, and high blood pressure? Blood pressure and heart disease High blood pressure causes heart disease and increases the risk of stroke. This is more likely to develop in people who have high blood pressure readings or are overweight. Have your blood pressure checked: Every 3-5 years if you are 47-77 years of age. Every year if you are 48 years old or older. Diabetes Have regular diabetes screenings. This checks your fasting blood sugar level. Have the screening done: Once every three years after age 22 if you are at a normal weight and have a low risk for diabetes. More often and at a younger age if you are overweight or have a high risk for diabetes. What should I know about preventing infection? Hepatitis B If you have a higher risk for hepatitis B, you should be screened for this virus. Talk with your health care provider to find out if you are at risk for hepatitis B infection. Hepatitis C Testing is recommended for: Everyone born from [birth year] through 1966. Anyone with known risk factors for hepatitis C. Sexually transmitted infections (STIs) Get screened for STIs, including gonorrhea and chlamydia, if: You are sexually active  and are younger than 62 years of age. You are older than 62 years of age and your health care provider tells you that you are at risk for this type of infection. Your sexual activity has changed since you were last screened, and you are at increased risk for chlamydia or gonorrhea. Ask your health care provider if you are at risk. Ask your health care provider about whether you are at high risk for HIV. Your health care provider may recommend a prescription medicine to help  prevent HIV infection. If you choose to take medicine to prevent HIV, you should first get tested for HIV. You should then be tested every 3 months for as long as you are taking the medicine. Pregnancy If you are about to stop having your period (premenopausal) and you may become pregnant, seek counseling before you get pregnant. Take 400 to 800 micrograms (mcg) of folic acid every day if you become pregnant. Ask for birth control (contraception) if you want to prevent pregnancy. Osteoporosis and menopause Osteoporosis is a disease in which the bones lose minerals and strength with aging. This can result in bone fractures. If you are 48 years old or older, or if you are at risk for osteoporosis and fractures, ask your health care provider if you should: Be screened for bone loss. Take a calcium or vitamin D supplement to lower your risk of fractures. Be given hormone replacement therapy (HRT) to treat symptoms of menopause. Follow these instructions at home: Alcohol use Do not drink alcohol if: Your health care provider tells you not to drink. You are pregnant, may be pregnant, or are planning to become pregnant. If you drink alcohol: Limit how much you have to: 0-1 drink a day. Know how much alcohol is in your drink. In the [place] one drink equals one 12 oz bottle of beer (355 mL), one 5 oz glass of wine (148 mL), or one 1 oz glass of hard liquor (44 mL). Lifestyle Do not use any products that contain nicotine or tobacco. These products include cigarettes, chewing tobacco, and vaping devices, such as e-cigarettes. If you need help quitting, ask your health care provider. Do not use street drugs. Do not share needles. Ask your health care provider for help if you need support or information about quitting drugs. General instructions Schedule regular health, dental, and eye exams. Stay current with your vaccines. Tell your health care provider if: You often feel depressed. You have ever  been abused or do not feel safe at home. Summary Adopting a healthy lifestyle and getting preventive care are important in promoting health and wellness. Follow your health care provider's instructions about healthy diet, exercising, and getting tested or screened for diseases. Follow your health care provider's instructions on monitoring your cholesterol and blood pressure. This information is not intended to replace advice given to you by your health care provider. Make sure you discuss any questions you have with your health care provider. Document Revised: 12/09/2020 Document Reviewed: 12/09/2020 Elsevier Patient Education  [place].

## 2022-04-02 NOTE — Progress Notes (Signed)
Hershal Coria Martin,acting as a Neurosurgeon for [name], MD.,have documented all relevant documentation on the behalf of [name], MD,as directed by  [name], MD while in the presence of [name], MD.   Subjective:     Patient ID: Susan Mason , female    DOB: 10-05-1960 , 61 y.o.   MRN: [id number]   Chief Complaint  Patient presents with   Annual Exam   Hypertension    HPI  She is here today for a full physical examination. She is followed by GYN for her pelvic exams. She retired 01/02/22. She has no specific concerns or complaints at this time. Patient reports compliance with medications.  BP Readings from Last 3 Encounters: 04/02/22 : 110/70 09/25/21 : 114/78 08/21/21 : 122/74    Hypertension This is a chronic problem. The current episode started more than 1 year ago. The problem has been gradually improving since onset. The problem is controlled. Associated symptoms include neck pain. Pertinent negatives include no blurred vision, chest pain, palpitations or shortness of breath. Risk factors for coronary artery disease include post-menopausal state. Past treatments include ACE inhibitors. The current treatment provides moderate improvement. There are no compliance problems.      Past Medical History:  Diagnosis Date   Abnormal Pap smear 2008   ASCUS   High blood pressure    Metrorrhagia    Osteopenia    Ovarian cyst    Yeast vaginitis      Family History  Problem Relation Age of Onset   Hyperlipidemia Mother    Hypertension Mother    Heart Problems Mother    Kidney disease Mother    Lung cancer Father    Emphysema Father    Breast cancer Neg Hx      Current Outpatient Medications:    azelastine (OPTIVAR) 0.05 % ophthalmic solution, Apply 1 drop to eye daily., Disp: 6 mL, Rfl: 2   cetirizine (ZYRTEC) 10 MG tablet, Take 10 mg by mouth daily. , Disp: , Rfl:    cholecalciferol (VITAMIN D) 1000 UNITS tablet, Take 1,000 Units by mouth daily.,  Disp: , Rfl:    fexofenadine-pseudoephedrine (ALLEGRA-D 24) 180-240 MG 24 hr tablet, Take 1 tablet by mouth daily., Disp: , Rfl:    Multiple Vitamins-Minerals (MULTIVITAMIN WITH MINERALS) tablet, Take 1 tablet by mouth daily., Disp: , Rfl:    ondansetron (ZOFRAN-ODT) 8 MG disintegrating tablet, Take 8 mg by mouth 3 (three) times daily., Disp: , Rfl:    pantoprazole (PROTONIX) 40 MG tablet, Take 1 tablet (40 mg total) by mouth daily., Disp: 30 tablet, Rfl: 1   Probiotic Product (PROBIOTIC-10 PO), Take 1 tablet by mouth daily., Disp: , Rfl:    lisinopril (ZESTRIL) 5 MG tablet, Take 1 tablet (5 mg total) by mouth daily., Disp: 90 tablet, Rfl: 2   Allergies  Allergen Reactions   Clindamycin Rash   Nsaids Other (See Comments)   Penicillins    Doxycycline    Doxycycline Calcium Hives   Erythromycin    Erythromycin Base       The patient states she uses post menopausal status for birth control. Last LMP was No LMP recorded. Patient is postmenopausal.. Negative for Dysmenorrhea. Negative for: breast discharge, breast lump(s), breast pain and breast self exam. Associated symptoms include abnormal vaginal bleeding. Pertinent negatives include abnormal bleeding (hematology), anxiety, decreased libido, depression, difficulty falling sleep, dyspareunia, history of infertility, nocturia, sexual dysfunction, sleep disturbances, urinary incontinence, urinary urgency, vaginal discharge and vaginal  itching. Diet regular.The patient states her exercise level is  moderate - walking/pickle ball.   . The patient's tobacco use is:  Social History   Tobacco Use  Smoking Status Never  Smokeless Tobacco Never  . She has been exposed to passive smoke. The patient's alcohol use is:  Social History   Substance and Sexual Activity  Alcohol Use No   Review of Systems  Constitutional: Negative.   HENT: Negative.    Eyes: Negative.  Negative for blurred vision.  Respiratory: Negative.  Negative for shortness of  breath.   Cardiovascular: Negative.  Negative for chest pain and palpitations.  Gastrointestinal: Negative.   Endocrine: Negative.   Genitourinary: Negative.   Musculoskeletal:  Positive for neck pain.  Skin: Negative.   Allergic/Immunologic: Negative.   Neurological: Negative.   Hematological: Negative.   Psychiatric/Behavioral: Negative.       Today's Vitals   04/02/22 0949  BP: 110/70  Pulse: 62  Temp: 98.2 F (36.8 C)  TempSrc: Oral  Weight: 100 lb 9.6 oz (45.6 kg)  Height: 5\' 1"  (1.549 m)  PainSc: 0-No pain   Body mass index is 19.01 kg/m.   Objective:  Physical Exam Vitals and nursing note reviewed.  Constitutional:      Appearance: Normal appearance.  HENT:     Head: Normocephalic and atraumatic.     Right Ear: Tympanic membrane, ear canal and external ear normal.     Left Ear: Tympanic membrane, ear canal and external ear normal.     Nose:     Comments: Masked     Mouth/Throat:     Comments: Masked  Eyes:     Extraocular Movements: Extraocular movements intact.     Conjunctiva/sclera: Conjunctivae normal.     Pupils: Pupils are equal, round, and reactive to light.  Cardiovascular:     Rate and Rhythm: Normal rate and regular rhythm.     Pulses: Normal pulses.     Heart sounds: Normal heart sounds.  Pulmonary:     Effort: Pulmonary effort is normal.     Breath sounds: Normal breath sounds.  Chest:  Breasts:    Tanner Score is 5.     Right: Normal.     Left: Normal.  Abdominal:     General: Abdomen is flat. Bowel sounds are normal.     Palpations: Abdomen is soft.  Genitourinary:    Comments: deferred Musculoskeletal:        General: Normal range of motion.     Cervical back: Normal range of motion and neck supple.  Skin:    General: Skin is warm and dry.  Neurological:     General: No focal deficit present.     Mental Status: She is alert and oriented to person, place, and time.  Psychiatric:        Mood and Affect: Mood normal.         Behavior: Behavior normal.      Assessment And Plan:     1. Annual physical exam Comments: A full exam was performed. Importance of monthly self breast exams was discussed with the patient.  PATIENT IS ADVISED TO GET 30-45 MINUTES REGULAR EXERCISE NO LESS THAN FOUR TO FIVE DAYS PER WEEK - BOTH WEIGHTBEARING EXERCISES AND AEROBIC ARE RECOMMENDED.  PATIENT IS ADVISED TO FOLLOW A HEALTHY DIET WITH AT LEAST SIX FRUITS/VEGGIES PER DAY, DECREASE INTAKE OF RED MEAT, AND TO INCREASE FISH INTAKE TO TWO DAYS PER WEEK.  MEATS/FISH SHOULD NOT BE FRIED, BAKED OR  BROILED IS PREFERABLE.  IT IS ALSO IMPORTANT TO CUT BACK ON YOUR SUGAR INTAKE. PLEASE AVOID ANYTHING WITH ADDED SUGAR, CORN SYRUP OR OTHER SWEETENERS. IF YOU MUST USE A SWEETENER, YOU CAN TRY STEVIA. IT IS ALSO IMPORTANT TO AVOID ARTIFICIALLY SWEETENERS AND DIET BEVERAGES. LASTLY, I SUGGEST WEARING SPF 50 SUNSCREEN ON EXPOSED PARTS AND ESPECIALLY WHEN IN THE DIRECT SUNLIGHT FOR AN EXTENDED PERIOD OF TIME.  PLEASE AVOID FAST FOOD RESTAURANTS AND INCREASE YOUR WATER INTAKE. - Microalbumin / Creatinine Urine Ratio - POCT Urinalysis Dipstick (81002) - EKG 12-Lead  2. Essential hypertension Comments: Chronic, well controlled. EKG performed, NSR w/o acute changes. She will c/w lisinopril 5mg  daily. She will f/u in 6 months.  - EKG 12-Lead - lisinopril (ZESTRIL) 5 MG tablet; Take 1 tablet (5 mg total) by mouth daily.  Dispense: 90 tablet; Refill: 2  3. Osteopenia of neck of right femur Comments: She is encouraged to continue with her regular walking schedule, along with calcium and vitamin D supplementation. Bone density due Dec 2023.  - DG Bone Density; Future  4. Immunization declined Comments: She declines Shingles vaccine.   Patient was given opportunity to ask questions. Patient verbalized understanding of the plan and was able to repeat key elements of the plan. All questions were answered to their satisfaction.   I, Jan 2024, MD, have  reviewed all documentation for this visit. The documentation on 04/02/22 for the exam, diagnosis, procedures, and orders are all accurate and complete.   THE PATIENT IS ENCOURAGED TO PRACTICE SOCIAL DISTANCING DUE TO THE COVID-19 PANDEMIC.

## 2022-04-13 ENCOUNTER — Encounter: Payer: Self-pay | Admitting: Internal Medicine

## 2022-05-29 DIAGNOSIS — Z1211 Encounter for screening for malignant neoplasm of colon: Secondary | ICD-10-CM | POA: Diagnosis not present

## 2022-05-29 DIAGNOSIS — K648 Other hemorrhoids: Secondary | ICD-10-CM | POA: Diagnosis not present

## 2022-05-29 LAB — HM COLONOSCOPY

## 2022-08-05 ENCOUNTER — Other Ambulatory Visit: Payer: Self-pay | Admitting: Internal Medicine

## 2022-08-05 DIAGNOSIS — I10 Essential (primary) hypertension: Secondary | ICD-10-CM

## 2022-09-08 DIAGNOSIS — Z681 Body mass index (BMI) 19 or less, adult: Secondary | ICD-10-CM | POA: Diagnosis not present

## 2022-09-08 DIAGNOSIS — N952 Postmenopausal atrophic vaginitis: Secondary | ICD-10-CM | POA: Diagnosis not present

## 2022-09-08 DIAGNOSIS — Z01419 Encounter for gynecological examination (general) (routine) without abnormal findings: Secondary | ICD-10-CM | POA: Diagnosis not present

## 2022-09-25 ENCOUNTER — Inpatient Hospital Stay: Admission: RE | Admit: 2022-09-25 | Payer: 59 | Source: Ambulatory Visit

## 2022-10-01 ENCOUNTER — Encounter: Payer: Self-pay | Admitting: Internal Medicine

## 2022-10-01 ENCOUNTER — Ambulatory Visit: Payer: 59 | Admitting: Internal Medicine

## 2022-10-01 VITALS — BP 126/84 | HR 69 | Temp 98.6°F | Ht 61.0 in | Wt 98.4 lb

## 2022-10-01 DIAGNOSIS — Z8249 Family history of ischemic heart disease and other diseases of the circulatory system: Secondary | ICD-10-CM | POA: Diagnosis not present

## 2022-10-01 DIAGNOSIS — I10 Essential (primary) hypertension: Secondary | ICD-10-CM | POA: Diagnosis not present

## 2022-10-01 DIAGNOSIS — Z2821 Immunization not carried out because of patient refusal: Secondary | ICD-10-CM | POA: Diagnosis not present

## 2022-10-01 DIAGNOSIS — M85851 Other specified disorders of bone density and structure, right thigh: Secondary | ICD-10-CM

## 2022-10-01 NOTE — Patient Instructions (Addendum)
The 10-year ASCVD risk score (Arnett DK, et al., 2019) is: 5%   Values used to calculate the score:     Age: 62 years     Sex: Female     Is Non-Hispanic African American: No     Diabetic: No     Tobacco smoker: No     Systolic Blood Pressure: 123XX123 mmHg     Is BP treated: Yes     HDL Cholesterol: 59 mg/dL     Total Cholesterol: 193 mg/dL  Hypertension, Adult Hypertension is another name for high blood pressure. High blood pressure forces your heart to work harder to pump blood. This can cause problems over time. There are two numbers in a blood pressure reading. There is a top number (systolic) over a bottom number (diastolic). It is best to have a blood pressure that is below 120/80. What are the causes? The cause of this condition is not known. Some other conditions can lead to high blood pressure. What increases the risk? Some lifestyle factors can make you more likely to develop high blood pressure: Smoking. Not getting enough exercise or physical activity. Being overweight. Having too much fat, sugar, calories, or salt (sodium) in your diet. Drinking too much alcohol. Other risk factors include: Having any of these conditions: Heart disease. Diabetes. High cholesterol. Kidney disease. Obstructive sleep apnea. Having a family history of high blood pressure and high cholesterol. Age. The risk increases with age. Stress. What are the signs or symptoms? High blood pressure may not cause symptoms. Very high blood pressure (hypertensive crisis) may cause: Headache. Fast or uneven heartbeats (palpitations). Shortness of breath. Nosebleed. Vomiting or feeling like you may vomit (nauseous). Changes in how you see. Very bad chest pain. Feeling dizzy. Seizures. How is this treated? This condition is treated by making healthy lifestyle changes, such as: Eating healthy foods. Exercising more. Drinking less alcohol. Your doctor may prescribe medicine if lifestyle changes do  not help enough and if: Your top number is above 130. Your bottom number is above 80. Your personal target blood pressure may vary. Follow these instructions at home: Eating and drinking  If told, follow the DASH eating plan. To follow this plan: Fill one half of your plate at each meal with fruits and vegetables. Fill one fourth of your plate at each meal with whole grains. Whole grains include whole-wheat pasta, brown rice, and whole-grain bread. Eat or drink low-fat dairy products, such as skim milk or low-fat yogurt. Fill one fourth of your plate at each meal with low-fat (lean) proteins. Low-fat proteins include fish, chicken without skin, eggs, beans, and tofu. Avoid fatty meat, cured and processed meat, or chicken with skin. Avoid pre-made or processed food. Limit the amount of salt in your diet to less than 1,500 mg each day. Do not drink alcohol if: Your doctor tells you not to drink. You are pregnant, may be pregnant, or are planning to become pregnant. If you drink alcohol: Limit how much you have to: 0-1 drink a day for women. 0-2 drinks a day for men. Know how much alcohol is in your drink. In the [place] one drink equals one 12 oz bottle of beer (355 mL), one 5 oz glass of wine (148 mL), or one 1 oz glass of hard liquor (44 mL). Lifestyle  Work with your doctor to stay at a healthy weight or to lose weight. Ask your doctor what the best weight is for you. Get at least 30 minutes of exercise  that causes your heart to beat faster (aerobic exercise) most days of the week. This may include walking, swimming, or biking. Get at least 30 minutes of exercise that strengthens your muscles (resistance exercise) at least 3 days a week. This may include lifting weights or doing Pilates. Do not smoke or use any products that contain nicotine or tobacco. If you need help quitting, ask your doctor. Check your blood pressure at home as told by your doctor. Keep all follow-up  visits. Medicines Take over-the-counter and prescription medicines only as told by your doctor. Follow directions carefully. Do not skip doses of blood pressure medicine. The medicine does not work as well if you skip doses. Skipping doses also puts you at risk for problems. Ask your doctor about side effects or reactions to medicines that you should watch for. Contact a doctor if: You think you are having a reaction to the medicine you are taking. You have headaches that keep coming back. You feel dizzy. You have swelling in your ankles. You have trouble with your vision. Get help right away if: You get a very bad headache. You start to feel mixed up (confused). You feel weak or numb. You feel faint. You have very bad pain in your: Chest. Belly (abdomen). You vomit more than once. You have trouble breathing. These symptoms may be an emergency. Get help right away. Call 911. Do not wait to see if the symptoms will go away. Do not drive yourself to the hospital. Summary Hypertension is another name for high blood pressure. High blood pressure forces your heart to work harder to pump blood. For most people, a normal blood pressure is less than 120/80. Making healthy choices can help lower blood pressure. If your blood pressure does not get lower with healthy choices, you may need to take medicine. This information is not intended to replace advice given to you by your health care provider. Make sure you discuss any questions you have with your health care provider. Document Revised: 05/08/2021 Document Reviewed: 05/08/2021 Elsevier Patient Education  [place].

## 2022-10-01 NOTE — Progress Notes (Signed)
I,Victoria T Hamilton,acting as a scribe for Maximino Greenland, MD.,have documented all relevant documentation on the behalf of Maximino Greenland, MD,as directed by  Maximino Greenland, MD while in the presence of Maximino Greenland, MD.    Subjective:     Patient ID: Susan Mason , female    DOB: 01-02-1961 , 62 y.o.   MRN: [id number]   Chief Complaint  Patient presents with   Hypertension    HPI  The patient is here today for a blood pressure follow-up. She reports compliance with meds. She denies headaches, chest pain and shortness of breath. Unfortunately, she has had to recently place her Mother in a skilled nursing facility.    Hypertension This is a chronic problem. The current episode started more than 1 year ago. The problem is unchanged. The problem is controlled. Pertinent negatives include no anxiety or malaise/fatigue. Past treatments include ACE inhibitors. There are no compliance problems.  There is no history of CAD/MI.     Past Medical History:  Diagnosis Date   Abnormal Pap smear 2008   ASCUS   High blood pressure    Metrorrhagia    Osteopenia    Ovarian cyst    Yeast vaginitis      Family History  Problem Relation Age of Onset   Hyperlipidemia Mother    Hypertension Mother    Heart Problems Mother    Kidney disease Mother    Lung cancer Father    Emphysema Father    Breast cancer Neg Hx      Current Outpatient Medications:    cetirizine (ZYRTEC) 10 MG tablet, Take 10 mg by mouth daily. , Disp: , Rfl:    cholecalciferol (VITAMIN D) 1000 UNITS tablet, Take 1,000 Units by mouth daily., Disp: , Rfl:    lisinopril (ZESTRIL) 5 MG tablet, TAKE 1 TABLET BY MOUTH DAILY, Disp: 90 tablet, Rfl: 2   Multiple Vitamins-Minerals (MULTIVITAMIN WITH MINERALS) tablet, Take 1 tablet by mouth daily., Disp: , Rfl:    pantoprazole (PROTONIX) 40 MG tablet, Take 1 tablet (40 mg total) by mouth daily., Disp: 30 tablet, Rfl: 1   Probiotic Product (PROBIOTIC-10 PO), Take 1 tablet by  mouth daily., Disp: , Rfl:    azelastine (OPTIVAR) 0.05 % ophthalmic solution, Apply 1 drop to eye daily. (Patient not taking: Reported on 10/01/2022), Disp: 6 mL, Rfl: 2   fexofenadine-pseudoephedrine (ALLEGRA-D 24) 180-240 MG 24 hr tablet, Take 1 tablet by mouth daily. (Patient not taking: Reported on 10/01/2022), Disp: , Rfl:    ondansetron (ZOFRAN-ODT) 8 MG disintegrating tablet, Take 8 mg by mouth 3 (three) times daily. (Patient not taking: Reported on 10/01/2022), Disp: , Rfl:    Allergies  Allergen Reactions   Clindamycin Rash   Nsaids Other (See Comments)   Penicillins    Doxycycline    Doxycycline Calcium Hives   Erythromycin    Erythromycin Base      Review of Systems  Constitutional: Negative.  Negative for malaise/fatigue.  Respiratory: Negative.    Cardiovascular: Negative.   Gastrointestinal: Negative.   Neurological: Negative.   Psychiatric/Behavioral: Negative.       Today's Vitals   10/01/22 1407  BP: 126/84  Pulse: 69  Temp: 98.6 F (37 C)  SpO2: 98%  Weight: 98 lb 6.4 oz (44.6 kg)  Height: '5\' 1"'$  (1.549 m)   Body mass index is 18.59 kg/m.  Wt Readings from Last 3 Encounters:  10/01/22 98 lb 6.4 oz (44.6 kg)  04/02/22 100 lb  9.6 oz (45.6 kg)  09/25/21 102 lb 9.6 oz (46.5 kg)     Objective:  Physical Exam Vitals and nursing note reviewed.  Constitutional:      Appearance: Normal appearance.  HENT:     Head: Normocephalic and atraumatic.     Nose:     Comments: Masked     Mouth/Throat:     Comments: Masked  Eyes:     Extraocular Movements: Extraocular movements intact.  Cardiovascular:     Rate and Rhythm: Normal rate and regular rhythm.     Heart sounds: Normal heart sounds.  Pulmonary:     Effort: Pulmonary effort is normal.     Breath sounds: Normal breath sounds.  Musculoskeletal:     Cervical back: Normal range of motion.  Skin:    General: Skin is warm.  Neurological:     General: No focal deficit present.     Mental Status: She is  alert.  Psychiatric:        Mood and Affect: Mood normal.        Behavior: Behavior normal.       Assessment And Plan:     1. Essential hypertension Comments: Chronic, controlled. She is encouraged to follow low sodium diet. She will c/w lisinopril 5mg  daily for now. Goal BP<120/80. F/u 6 months for CPE. - CMP14+EGFR - TSH  2. Osteopenia of neck of right femur Comments: Bone density scheduled for July 2024. I will check a vitamin D level today. - Vitamin D (25 hydroxy)  3. Herpes zoster vaccination declined  4. Family history of chronic ischemic heart disease - CT CARDIAC SCORING (SELF PAY ONLY); Future   Patient was given opportunity to ask questions. Patient verbalized understanding of the plan and was able to repeat key elements of the plan. All questions were answered to their satisfaction.   I, Maximino Greenland, MD, have reviewed all documentation for this visit. The documentation on 10/01/22 for the exam, diagnosis, procedures, and orders are all accurate and complete.   IF YOU HAVE BEEN REFERRED TO A SPECIALIST, IT MAY TAKE 1-2 WEEKS TO SCHEDULE/PROCESS THE REFERRAL. IF YOU HAVE NOT HEARD FROM US/SPECIALIST IN TWO WEEKS, PLEASE GIVE [place] A CALL AT [phone].   THE PATIENT IS ENCOURAGED TO PRACTICE SOCIAL DISTANCING DUE TO THE COVID-19 PANDEMIC.

## 2022-10-02 LAB — VITAMIN D 25 HYDROXY (VIT D DEFICIENCY, FRACTURES): Vit D, 25-Hydroxy: 66.6 ng/mL (ref 30.0–100.0)

## 2022-10-02 LAB — CMP14+EGFR
ALT: 13 IU/L (ref 0–32)
AST: 17 IU/L (ref 0–40)
Albumin/Globulin Ratio: 2.2 (ref 1.2–2.2)
Albumin: 4.6 g/dL (ref 3.9–4.9)
Alkaline Phosphatase: 57 IU/L (ref 44–121)
BUN/Creatinine Ratio: 19 (ref 12–28)
BUN: 17 mg/dL (ref 8–27)
Bilirubin Total: 0.3 mg/dL (ref 0.0–1.2)
CO2: 23 mmol/L (ref 20–29)
Calcium: 9.9 mg/dL (ref 8.7–10.3)
Chloride: 101 mmol/L (ref 96–106)
Creatinine, Ser: 0.91 mg/dL (ref 0.57–1.00)
Globulin, Total: 2.1 g/dL (ref 1.5–4.5)
Glucose: 79 mg/dL (ref 70–99)
Potassium: 4.1 mmol/L (ref 3.5–5.2)
Sodium: 140 mmol/L (ref 134–144)
Total Protein: 6.7 g/dL (ref 6.0–8.5)
eGFR: 71 mL/min/{1.73_m2} (ref >59)

## 2022-10-02 LAB — TSH: TSH: 0.525 u[IU]/mL (ref 0.450–4.500)

## 2022-11-03 DIAGNOSIS — D225 Melanocytic nevi of trunk: Secondary | ICD-10-CM | POA: Diagnosis not present

## 2022-11-03 DIAGNOSIS — L821 Other seborrheic keratosis: Secondary | ICD-10-CM | POA: Diagnosis not present

## 2022-11-03 DIAGNOSIS — L814 Other melanin hyperpigmentation: Secondary | ICD-10-CM | POA: Diagnosis not present

## 2022-11-04 ENCOUNTER — Ambulatory Visit (HOSPITAL_BASED_OUTPATIENT_CLINIC_OR_DEPARTMENT_OTHER)
Admission: RE | Admit: 2022-11-04 | Discharge: 2022-11-04 | Disposition: A | Discharge status: Home / Self Care | Payer: 59 | Source: Ambulatory Visit | Attending: Internal Medicine | Admitting: Internal Medicine

## 2022-11-04 DIAGNOSIS — Z8249 Family history of ischemic heart disease and other diseases of the circulatory system: Secondary | ICD-10-CM

## 2022-11-12 DIAGNOSIS — K449 Diaphragmatic hernia without obstruction or gangrene: Secondary | ICD-10-CM | POA: Diagnosis not present

## 2022-11-12 DIAGNOSIS — K219 Gastro-esophageal reflux disease without esophagitis: Secondary | ICD-10-CM | POA: Diagnosis not present

## 2022-11-12 DIAGNOSIS — R140 Abdominal distension (gaseous): Secondary | ICD-10-CM | POA: Diagnosis not present

## 2022-11-12 DIAGNOSIS — K5900 Constipation, unspecified: Secondary | ICD-10-CM | POA: Diagnosis not present

## 2022-12-08 DIAGNOSIS — M180 Bilateral primary osteoarthritis of first carpometacarpal joints: Secondary | ICD-10-CM | POA: Diagnosis not present

## 2022-12-08 DIAGNOSIS — M1811 Unilateral primary osteoarthritis of first carpometacarpal joint, right hand: Secondary | ICD-10-CM | POA: Diagnosis not present

## 2022-12-08 DIAGNOSIS — M65311 Trigger thumb, right thumb: Secondary | ICD-10-CM | POA: Diagnosis not present

## 2022-12-08 DIAGNOSIS — M1812 Unilateral primary osteoarthritis of first carpometacarpal joint, left hand: Secondary | ICD-10-CM | POA: Diagnosis not present

## 2023-01-05 DIAGNOSIS — H33021 Retinal detachment with multiple breaks, right eye: Secondary | ICD-10-CM | POA: Diagnosis not present

## 2023-01-05 DIAGNOSIS — H35412 Lattice degeneration of retina, left eye: Secondary | ICD-10-CM | POA: Diagnosis not present

## 2023-01-05 DIAGNOSIS — H2513 Age-related nuclear cataract, bilateral: Secondary | ICD-10-CM | POA: Diagnosis not present

## 2023-01-08 DIAGNOSIS — H35412 Lattice degeneration of retina, left eye: Secondary | ICD-10-CM | POA: Diagnosis not present

## 2023-01-08 DIAGNOSIS — H33021 Retinal detachment with multiple breaks, right eye: Secondary | ICD-10-CM | POA: Diagnosis not present

## 2023-01-13 DIAGNOSIS — H33001 Unspecified retinal detachment with retinal break, right eye: Secondary | ICD-10-CM | POA: Diagnosis not present

## 2023-01-13 DIAGNOSIS — H33011 Retinal detachment with single break, right eye: Secondary | ICD-10-CM | POA: Diagnosis not present

## 2023-01-13 DIAGNOSIS — H35412 Lattice degeneration of retina, left eye: Secondary | ICD-10-CM | POA: Diagnosis not present

## 2023-01-13 DIAGNOSIS — Z79899 Other long term (current) drug therapy: Secondary | ICD-10-CM | POA: Diagnosis not present

## 2023-01-13 DIAGNOSIS — H33021 Retinal detachment with multiple breaks, right eye: Secondary | ICD-10-CM | POA: Diagnosis not present

## 2023-01-13 DIAGNOSIS — H33332 Multiple defects of retina without detachment, left eye: Secondary | ICD-10-CM | POA: Diagnosis not present

## 2023-01-13 HISTORY — PX: RETINAL DETACHMENT SURGERY: SHX105

## 2023-01-14 DIAGNOSIS — H33021 Retinal detachment with multiple breaks, right eye: Secondary | ICD-10-CM | POA: Diagnosis not present

## 2023-01-21 DIAGNOSIS — H2513 Age-related nuclear cataract, bilateral: Secondary | ICD-10-CM | POA: Diagnosis not present

## 2023-01-21 DIAGNOSIS — H33021 Retinal detachment with multiple breaks, right eye: Secondary | ICD-10-CM | POA: Diagnosis not present

## 2023-01-21 DIAGNOSIS — H35412 Lattice degeneration of retina, left eye: Secondary | ICD-10-CM | POA: Diagnosis not present

## 2023-01-28 DIAGNOSIS — L309 Dermatitis, unspecified: Secondary | ICD-10-CM | POA: Diagnosis not present

## 2023-01-28 DIAGNOSIS — L989 Disorder of the skin and subcutaneous tissue, unspecified: Secondary | ICD-10-CM | POA: Diagnosis not present

## 2023-01-28 DIAGNOSIS — L920 Granuloma annulare: Secondary | ICD-10-CM | POA: Diagnosis not present

## 2023-02-01 ENCOUNTER — Ambulatory Visit: Payer: 59 | Admitting: Internal Medicine

## 2023-02-01 ENCOUNTER — Encounter: Payer: Self-pay | Admitting: Internal Medicine

## 2023-02-01 VITALS — BP 110/78 | HR 72 | Temp 98.4°F | Ht 61.0 in | Wt 98.6 lb

## 2023-02-01 DIAGNOSIS — L989 Disorder of the skin and subcutaneous tissue, unspecified: Secondary | ICD-10-CM | POA: Diagnosis not present

## 2023-02-01 DIAGNOSIS — I10 Essential (primary) hypertension: Secondary | ICD-10-CM

## 2023-02-01 DIAGNOSIS — U071 COVID-19: Secondary | ICD-10-CM | POA: Diagnosis not present

## 2023-02-01 DIAGNOSIS — R1310 Dysphagia, unspecified: Secondary | ICD-10-CM | POA: Diagnosis not present

## 2023-02-01 DIAGNOSIS — E010 Iodine-deficiency related diffuse (endemic) goiter: Secondary | ICD-10-CM | POA: Diagnosis not present

## 2023-02-01 LAB — POC SOFIA 2 FLU + SARS ANTIGEN FIA
Influenza A, POC: NEGATIVE
Influenza B, POC: NEGATIVE
SARS Coronavirus 2 Ag: POSITIVE — AB

## 2023-02-01 MED ORDER — NIRMATRELVIR/RITONAVIR (PAXLOVID)TABLET: 3.0000 | ORAL_TABLET | Freq: Two times a day (BID) | ORAL | 0 refills | Status: AC

## 2023-02-01 NOTE — Progress Notes (Unsigned)
I,[name], CMA,acting as a Neurosurgeon for Susan Aliment, MD.,have documented all relevant documentation on the behalf of Susan Aliment, MD,as directed by  Susan Aliment, MD while in the presence of Susan Aliment, MD.  Subjective:  Patient ID: Susan Mason , female    DOB: [birth year] , 62 y.o.   MRN: [id number]  Chief Complaint  Patient presents with   Hypertension   URI    HPI  The patient is here today for a blood pressure follow-up. She reports compliance with meds. Patient also complains of a headache and scratchy throat. Denies fever/chills. She denies ill contacts. Admits she does not leave the house much, she states no one else in her household is sick.    Hypertension This is a chronic problem. The current episode started more than 1 year ago. The problem is unchanged. The problem is controlled. Associated symptoms include headaches. Pertinent negatives include no anxiety or malaise/fatigue. Past treatments include ACE inhibitors. There are no compliance problems.  There is no history of CAD/MI.  URI  Associated symptoms include congestion, headaches and a sore throat.     Past Medical History:  Diagnosis Date   Abnormal Pap smear 08/03/2006   ASCUS   Granuloma annulare    High blood pressure    Metrorrhagia    Osteopenia    Ovarian cyst    Yeast vaginitis      Family History  Problem Relation Age of Onset   Hyperlipidemia Mother    Hypertension Mother    Heart Problems Mother    Kidney disease Mother    Lung cancer Father    Emphysema Father    Breast cancer Neg Hx      Current Outpatient Medications:    brimonidine (ALPHAGAN) 0.2 % ophthalmic solution, Place 1 drop into the right eye 3 (three) times daily., Disp: , Rfl:    cetirizine (ZYRTEC) 10 MG tablet, Take 10 mg by mouth daily. , Disp: , Rfl:    cholecalciferol (VITAMIN D) 1000 UNITS tablet, Take 1,000 Units by mouth daily., Disp: , Rfl:    lisinopril (ZESTRIL) 5 MG tablet, TAKE 1 TABLET  BY MOUTH DAILY, Disp: 90 tablet, Rfl: 2   Multiple Vitamins-Minerals (MULTIVITAMIN WITH MINERALS) tablet, Take 1 tablet by mouth daily., Disp: , Rfl:    nirmatrelvir/ritonavir (PAXLOVID) 20 x 150 MG & 10 x 100MG  TABS, Take 3 tablets by mouth 2 (two) times daily for 5 days. Take nirmatrelvir (150 mg) two tablets twice daily for 5 days and ritonavir (100 mg) one tablet twice daily for 5 days., Disp: 30 tablet, Rfl: 0   pantoprazole (PROTONIX) 40 MG tablet, Take 1 tablet (40 mg total) by mouth daily. (Patient taking differently: Take 40 mg by mouth 2 (two) times daily.), Disp: 30 tablet, Rfl: 1   prednisoLONE acetate (PRED FORTE) 1 % ophthalmic suspension, Place 1 drop into the right eye 6 (six) times daily., Disp: , Rfl:    Probiotic Product (PROBIOTIC-10 PO), Take 1 tablet by mouth daily., Disp: , Rfl:    timolol (BETIMOL) 0.5 % ophthalmic solution, 1 drop in the morning, at noon, and at bedtime., Disp: , Rfl:    Allergies  Allergen Reactions   Clindamycin Rash   Nsaids Other (See Comments)   Penicillins    Doxycycline    Doxycycline Calcium Hives   Erythromycin    Erythromycin Base      Review of Systems  Constitutional:  Positive for fatigue. Negative for malaise/fatigue.  HENT:  Positive for congestion and sore throat.   Eyes: Negative.   Respiratory: Negative.    Cardiovascular: Negative.   Gastrointestinal: Negative.        She c/o difficulty swallowing liquids/solids. She states sx are intermittent. Not sure what has triggered her sx. No recent abx. Has h/o enlarged thyroid gland. There is no pain with swallowing.   Musculoskeletal: Negative.   Skin: Negative.   Neurological:  Positive for headaches.  Psychiatric/Behavioral: Negative.       Today's Vitals   02/01/23 1442  BP: 110/78  Pulse: 72  Temp: 98.4 F (36.9 C)  Weight: 98 lb 9.6 oz (44.7 kg)  Height: 5\' 1"  (1.549 m)  PainSc: 7   PainLoc: Head   Body mass index is 18.63 kg/m.  Wt Readings from Last 3  Encounters:  02/01/23 98 lb 9.6 oz (44.7 kg)  10/01/22 98 lb 6.4 oz (44.6 kg)  04/02/22 100 lb 9.6 oz (45.6 kg)    The 10-year ASCVD risk score (Arnett DK, et al., 2019) is: 4.2%   Values used to calculate the score:     Age: 69 years     Sex: Female     Is Non-Hispanic African American: No     Diabetic: No     Tobacco smoker: No     Systolic Blood Pressure: 110 mmHg     Is BP treated: Yes     HDL Cholesterol: 59 mg/dL     Total Cholesterol: 193 mg/dL  Objective:  Physical Exam Vitals and nursing note reviewed.  Constitutional:      Appearance: Normal appearance. She is ill-appearing.  HENT:     Head: Normocephalic and atraumatic.     Right Ear: Tympanic membrane, ear canal and external ear normal. There is no impacted cerumen.     Left Ear: Tympanic membrane, ear canal and external ear normal. There is no impacted cerumen.     Mouth/Throat:     Pharynx: Posterior oropharyngeal erythema present. No oropharyngeal exudate.  Eyes:     Extraocular Movements: Extraocular movements intact.  Cardiovascular:     Rate and Rhythm: Normal rate and regular rhythm.     Heart sounds: Normal heart sounds.  Pulmonary:     Effort: Pulmonary effort is normal.     Breath sounds: Normal breath sounds.  Skin:    General: Skin is warm.     Comments: Skin lesion r arm, sl erythema   Neurological:     General: No focal deficit present.     Mental Status: She is alert.  Psychiatric:        Mood and Affect: Mood normal.        Behavior: Behavior normal.         Assessment And Plan:  Essential hypertension Assessment & Plan: Chronic, well controlled. Advised to follow low sodium diet. She will c/w lisinopril 5mg  daily. Advised to follow low sodium diet.   Orders: -     CMP14+EGFR  COVID-19 Assessment & Plan: POC test is neg for flu A/B and pos for COVID. This is her first time having this infection. Agrees to treatment with Paxlovid. Unable to do Mychart temp monitoring, she is not  on Mychart. Encouraged to call office in 24-48 hours to give an update on how she is feeling.  I will also refer her for home monitoring/temperature monitoring program. She is encouraged to go to ER should she develop worsening SOB. Pt advised that she will be out of work  for at least one week.  She verbalizes understanding of her treatment plan. All questions were answered to her satisfaction. She understands that she needs to continue to self quarantine for 5 days, then mask for another five days.   Orders: -     POC SOFIA 2 FLU + SARS ANTIGEN FIA -     nirmatrelvir/ritonavir; Take 3 tablets by mouth 2 (two) times daily for 5 days. Take nirmatrelvir (150 mg) two tablets twice daily for 5 days and ritonavir (100 mg) one tablet twice daily for 5 days.  Dispense: 30 tablet; Refill: 0  Skin lesion Assessment & Plan: Recently biopsied by Derm, thought to be granuloma annulare. Currently awaiting results.    Dysphagia, unspecified type -     US THYROID; Future  Thyromegaly Assessment & Plan: I will check repeat thyroid ultrasound given her new symptoms of dysphagia.   Orders: -     US THYROID; Future    Return in about 4 months (around 06/04/2023) for bp check.  Patient was given opportunity to ask questions. Patient verbalized understanding of the plan and was able to repeat key elements of the plan. All questions were answered to their satisfaction.    I, Susan Aliment, MD, have reviewed all documentation for this visit. The documentation on 02/01/23 for the exam, diagnosis, procedures, and orders are all accurate and complete.   IF YOU HAVE BEEN REFERRED TO A SPECIALIST, IT MAY TAKE 1-2 WEEKS TO SCHEDULE/PROCESS THE REFERRAL. IF YOU HAVE NOT HEARD FROM US/SPECIALIST IN TWO WEEKS, PLEASE GIVE [place] A CALL AT [phone].

## 2023-02-01 NOTE — Patient Instructions (Signed)
Hypertension, Adult Hypertension is another name for high blood pressure. High blood pressure forces your heart to work harder to pump blood. This can cause problems over time. There are two numbers in a blood pressure reading. There is a top number (systolic) over a bottom number (diastolic). It is best to have a blood pressure that is below 120/80. What are the causes? The cause of this condition is not known. Some other conditions can lead to high blood pressure. What increases the risk? Some lifestyle factors can make you more likely to develop high blood pressure: Smoking. Not getting enough exercise or physical activity. Being overweight. Having too much fat, sugar, calories, or salt (sodium) in your diet. Drinking too much alcohol. Other risk factors include: Having any of these conditions: Heart disease. Diabetes. High cholesterol. Kidney disease. Obstructive sleep apnea. Having a family history of high blood pressure and high cholesterol. Age. The risk increases with age. Stress. What are the signs or symptoms? High blood pressure may not cause symptoms. Very high blood pressure (hypertensive crisis) may cause: Headache. Fast or uneven heartbeats (palpitations). Shortness of breath. Nosebleed. Vomiting or feeling like you may vomit (nauseous). Changes in how you see. Very bad chest pain. Feeling dizzy. Seizures. How is this treated? This condition is treated by making healthy lifestyle changes, such as: Eating healthy foods. Exercising more. Drinking less alcohol. Your doctor may prescribe medicine if lifestyle changes do not help enough and if: Your top number is above 130. Your bottom number is above 80. Your personal target blood pressure may vary. Follow these instructions at home: Eating and drinking  If told, follow the DASH eating plan. To follow this plan: Fill one half of your plate at each meal with fruits and vegetables. Fill one fourth of your plate  at each meal with whole grains. Whole grains include whole-wheat pasta, brown rice, and whole-grain bread. Eat or drink low-fat dairy products, such as skim milk or low-fat yogurt. Fill one fourth of your plate at each meal with low-fat (lean) proteins. Low-fat proteins include fish, chicken without skin, eggs, beans, and tofu. Avoid fatty meat, cured and processed meat, or chicken with skin. Avoid pre-made or processed food. Limit the amount of salt in your diet to less than 1,500 mg each day. Do not drink alcohol if: Your doctor tells you not to drink. You are pregnant, may be pregnant, or are planning to become pregnant. If you drink alcohol: Limit how much you have to: 0-1 drink a day for women. 0-2 drinks a day for men. Know how much alcohol is in your drink. In the [place] one drink equals one 12 oz bottle of beer (355 mL), one 5 oz glass of wine (148 mL), or one 1 oz glass of hard liquor (44 mL). Lifestyle  Work with your doctor to stay at a healthy weight or to lose weight. Ask your doctor what the best weight is for you. Get at least 30 minutes of exercise that causes your heart to beat faster (aerobic exercise) most days of the week. This may include walking, swimming, or biking. Get at least 30 minutes of exercise that strengthens your muscles (resistance exercise) at least 3 days a week. This may include lifting weights or doing Pilates. Do not smoke or use any products that contain nicotine or tobacco. If you need help quitting, ask your doctor. Check your blood pressure at home as told by your doctor. Keep all follow-up visits. Medicines Take over-the-counter and prescription medicines   only as told by your doctor. Follow directions carefully. Do not skip doses of blood pressure medicine. The medicine does not work as well if you skip doses. Skipping doses also puts you at risk for problems. Ask your doctor about side effects or reactions to medicines that you should watch  for. Contact a doctor if: You think you are having a reaction to the medicine you are taking. You have headaches that keep coming back. You feel dizzy. You have swelling in your ankles. You have trouble with your vision. Get help right away if: You get a very bad headache. You start to feel mixed up (confused). You feel weak or numb. You feel faint. You have very bad pain in your: Chest. Belly (abdomen). You vomit more than once. You have trouble breathing. These symptoms may be an emergency. Get help right away. Call 911. Do not wait to see if the symptoms will go away. Do not drive yourself to the hospital. Summary Hypertension is another name for high blood pressure. High blood pressure forces your heart to work harder to pump blood. For most people, a normal blood pressure is less than 120/80. Making healthy choices can help lower blood pressure. If your blood pressure does not get lower with healthy choices, you may need to take medicine. This information is not intended to replace advice given to you by your health care provider. Make sure you discuss any questions you have with your health care provider. Document Revised: 05/08/2021 Document Reviewed: 05/08/2021 Elsevier Patient Education  2024 Else[employer] Inc.  

## 2023-02-02 ENCOUNTER — Encounter: Payer: Self-pay | Admitting: Internal Medicine

## 2023-02-02 DIAGNOSIS — U071 COVID-19: Secondary | ICD-10-CM | POA: Insufficient documentation

## 2023-02-02 DIAGNOSIS — E049 Nontoxic goiter, unspecified: Secondary | ICD-10-CM | POA: Insufficient documentation

## 2023-02-02 DIAGNOSIS — R1310 Dysphagia, unspecified: Secondary | ICD-10-CM | POA: Insufficient documentation

## 2023-02-02 DIAGNOSIS — E010 Iodine-deficiency related diffuse (endemic) goiter: Secondary | ICD-10-CM | POA: Insufficient documentation

## 2023-02-02 DIAGNOSIS — L989 Disorder of the skin and subcutaneous tissue, unspecified: Secondary | ICD-10-CM | POA: Insufficient documentation

## 2023-02-02 LAB — CMP14+EGFR
ALT: 14 IU/L (ref 0–32)
AST: 19 IU/L (ref 0–40)
Albumin: 4.4 g/dL (ref 3.9–4.9)
Alkaline Phosphatase: 61 IU/L (ref 44–121)
BUN/Creatinine Ratio: 12 (ref 12–28)
BUN: 13 mg/dL (ref 8–27)
Bilirubin Total: 0.4 mg/dL (ref 0.0–1.2)
CO2: 21 mmol/L (ref 20–29)
Calcium: 9.1 mg/dL (ref 8.7–10.3)
Chloride: 94 mmol/L — ABNORMAL LOW (ref 96–106)
Creatinine, Ser: 1.08 mg/dL — ABNORMAL HIGH (ref 0.57–1.00)
Globulin, Total: 2.2 g/dL (ref 1.5–4.5)
Glucose: 83 mg/dL (ref 70–99)
Potassium: 4.3 mmol/L (ref 3.5–5.2)
Sodium: 128 mmol/L — ABNORMAL LOW (ref 134–144)
Total Protein: 6.6 g/dL (ref 6.0–8.5)
eGFR: 58 mL/min/{1.73_m2} — ABNORMAL LOW (ref >59)

## 2023-02-02 NOTE — Assessment & Plan Note (Signed)
POC test is neg for flu A/B and pos for COVID. This is her first time having this infection. Agrees to treatment with Paxlovid. Unable to do Mychart temp monitoring, she is not on Mychart. Encouraged to call office in 24-48 hours to give an update on how she is feeling.  I will also refer her for home monitoring/temperature monitoring program. She is encouraged to go to ER should she develop worsening SOB. Pt advised that she will be out of work for at least one week.  She verbalizes understanding of her treatment plan. All questions were answered to her satisfaction. She understands that she needs to continue to self quarantine for 5 days, then mask for another five days.

## 2023-02-02 NOTE — Assessment & Plan Note (Signed)
I will check repeat thyroid ultrasound given her new symptoms of dysphagia.

## 2023-02-02 NOTE — Assessment & Plan Note (Signed)
Due to history of thyromegaly, I will first check thyroid ultrasound. She understands that I will likely need to evaluate her with a barium swallow as well. She agrees to treatment plan.

## 2023-02-02 NOTE — Assessment & Plan Note (Signed)
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 Currently awaiting results.

## 2023-02-02 NOTE — Assessment & Plan Note (Signed)
Chronic, well controlled. Advised to follow low sodium diet. She will c/w lisinopril 5mg  daily. Advised to follow low sodium diet.

## 2023-02-12 DIAGNOSIS — L309 Dermatitis, unspecified: Secondary | ICD-10-CM | POA: Diagnosis not present

## 2023-02-16 ENCOUNTER — Ambulatory Visit (HOSPITAL_BASED_OUTPATIENT_CLINIC_OR_DEPARTMENT_OTHER)
Admission: RE | Admit: 2023-02-16 | Discharge: 2023-02-16 | Disposition: A | Discharge status: Home / Self Care | Payer: 59 | Source: Ambulatory Visit | Attending: Internal Medicine | Admitting: Internal Medicine

## 2023-02-16 DIAGNOSIS — R1310 Dysphagia, unspecified: Secondary | ICD-10-CM | POA: Insufficient documentation

## 2023-02-16 DIAGNOSIS — E010 Iodine-deficiency related diffuse (endemic) goiter: Secondary | ICD-10-CM | POA: Insufficient documentation

## 2023-02-16 DIAGNOSIS — H2513 Age-related nuclear cataract, bilateral: Secondary | ICD-10-CM | POA: Diagnosis not present

## 2023-02-16 DIAGNOSIS — E049 Nontoxic goiter, unspecified: Secondary | ICD-10-CM | POA: Diagnosis not present

## 2023-02-16 DIAGNOSIS — H35412 Lattice degeneration of retina, left eye: Secondary | ICD-10-CM | POA: Diagnosis not present

## 2023-02-16 DIAGNOSIS — H33021 Retinal detachment with multiple breaks, right eye: Secondary | ICD-10-CM | POA: Diagnosis not present

## 2023-02-21 ENCOUNTER — Telehealth (HOSPITAL_COMMUNITY): Payer: Self-pay | Admitting: Family Medicine

## 2023-02-21 ENCOUNTER — Ambulatory Visit (INDEPENDENT_AMBULATORY_CARE_PROVIDER_SITE_OTHER): Payer: 59

## 2023-02-21 ENCOUNTER — Encounter (HOSPITAL_COMMUNITY): Payer: Self-pay | Admitting: Emergency Medicine

## 2023-02-21 ENCOUNTER — Ambulatory Visit (HOSPITAL_COMMUNITY)
Admission: EM | Admit: 2023-02-21 | Discharge: 2023-02-21 | Disposition: A | Discharge status: Home / Self Care | Payer: 59 | Attending: Family Medicine | Admitting: Family Medicine

## 2023-02-21 DIAGNOSIS — I10 Essential (primary) hypertension: Secondary | ICD-10-CM | POA: Diagnosis not present

## 2023-02-21 DIAGNOSIS — R053 Chronic cough: Secondary | ICD-10-CM | POA: Diagnosis not present

## 2023-02-21 DIAGNOSIS — Z8616 Personal history of COVID-19: Secondary | ICD-10-CM | POA: Diagnosis not present

## 2023-02-21 DIAGNOSIS — R058 Other specified cough: Secondary | ICD-10-CM | POA: Diagnosis not present

## 2023-02-21 NOTE — Telephone Encounter (Signed)
Normal xray result discussed with her.   DG Chest 2 View  Result Date: 02/21/2023 CLINICAL DATA:  History of COVID with persistent cough, initial encounter EXAM: CHEST - 2 VIEW COMPARISON:  11/04/2023 FINDINGS: Cardiac shadow is within normal limits. Lungs are well aerated bilaterally. No focal infiltrate or sizable effusion is seen. No bony abnormality is noted IMPRESSION: No acute abnormality noted. Electronically Signed   By: Alcide Clever M.D.   On: 02/21/2023 17:48

## 2023-02-21 NOTE — Discharge Instructions (Signed)
It was nice seeing you. 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 Your chest xray reviewed by me is reassuring. However, I will contact you by phone if the radiologist's read is abnormal Continue Delsym as needed for cough.

## 2023-02-21 NOTE — ED Provider Notes (Signed)
[facility]    CSN: [id number] Arrival date & time: 02/21/23  1607      History   Chief Complaint Chief Complaint  Patient presents with   Cough    HPI Susan Mason is a 62 y.o. female.   The history is provided by the patient. No language interpreter was used.  Cough Cough characteristics:  Non-productive Severity:  Moderate Onset quality:  Gradual Duration:  3 weeks (She tested for COVID-19 3 weeks ago) Progression:  Waxing and waning Smoker: no   Context comment:  Husband also tested positive at home for COVID-19 Relieved by: Delsum helped a bit and then she stopped it two weeks ago. Took Paxlovid x 3 days when she tested COVID positive. Ineffective treatments:  None tried Associated symptoms: sinus congestion   Associated symptoms: no chest pain, no fever, no headaches, no shortness of breath, no sore throat and no wheezing   Hypertension This is a chronic problem. Pertinent negatives include no chest pain, no abdominal pain, no headaches and no shortness of breath. Treatments tried: She is on Lisinopril 5 mg QD. Has not checked home BP recently.     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   Dysphagia 02/02/2023   Fatigue 08/21/2021   Thinning hair 07/31/2021   Brittle nails 07/31/2021   Allergy desensitization therapy 12/26/2020   Other allergic rhinitis 09/17/2020   Essential hypertension 06/16/2018   Osteopenia 04/01/2012    Past Surgical History:  Procedure Laterality Date   COLPOSCOPY  08/03/2006   neg bx   EYE SURGERY     RETINAL DETACHMENT SURGERY Right 01/13/2023    OB History     Gravida  0   Para      Term      Preterm      AB      Living          SAB      IAB      Ectopic      Multiple      Live Births               Home Medications    Prior to Admission medications   Medication Sig Start Date End Date Taking? Authorizing Provider  brimonidine (ALPHAGAN) 0.2 % ophthalmic solution Place 1 drop into the right eye 3 (three) times daily.    [provider]  cetirizine (ZYRTEC) 10 MG tablet Take 10 mg by mouth daily.     [provider]  cholecalciferol (VITAMIN D) 1000 UNITS tablet Take 1,000 Units by mouth daily.    [provider]  lisinopril (ZESTRIL) 5 MG tablet TAKE 1 TABLET BY MOUTH DAILY 08/05/22   Dorothyann Peng, MD  Multiple Vitamins-Minerals (MULTIVITAMIN WITH MINERALS) tablet Take 1 tablet by mouth daily.    [provider]  pantoprazole (PROTONIX) 40 MG tablet Take 1 tablet (40 mg total) by mouth daily. Patient taking differently: Take 40 mg by mouth 2 (two) times daily. 04/02/22 04/02/23  Dorothyann Peng, MD  prednisoLONE acetate (PRED FORTE) 1 % ophthalmic suspension Place 1 drop into the right eye 6 (six) times daily.  01/21/23   [provider]  Probiotic Product (PROBIOTIC-10 PO) Take 1 tablet by mouth daily.    [provider]  timolol (BETIMOL) 0.5 % ophthalmic solution 1 drop in the morning, at noon, and at bedtime.    [provider]    Family History Family History  Problem Relation Age of Onset   Hyperlipidemia Mother    Hypertension Mother    Heart Problems Mother    Kidney disease Mother    Lung cancer Father    Emphysema Father    Breast cancer Neg Hx     Social History Social History   Tobacco Use   Smoking status: Never   Smokeless tobacco: Never  Vaping Use   Vaping status: Never Used  Substance Use Topics   Alcohol use: No   Drug use: No     Allergies   Clindamycin, Nsaids, Penicillins, Doxycycline, Doxycycline calcium, Erythromycin, and Erythromycin base   Review of Systems Review of Systems   Constitutional:  Negative for fever.  HENT:  Negative for sore throat.   Respiratory:  Positive for cough. Negative for shortness of breath and wheezing.   Cardiovascular:  Negative for chest pain.  Gastrointestinal:  Negative for abdominal pain.  Neurological:  Negative for headaches.  All other systems reviewed and are negative.    Physical Exam Triage Vital Signs ED Triage Vitals  Encounter Vitals Group     BP 02/21/23 1700 (!) 176/94     Systolic BP Percentile --      Diastolic BP Percentile --      Pulse Rate 02/21/23 1700 77     Resp 02/21/23 1700 16     Temp 02/21/23 1700 98.5 F (36.9 C)     Temp Source 02/21/23 1700 Oral     SpO2 02/21/23 1700 98 %     Weight --      Height --      Head Circumference --      Peak Flow --      Pain Score 02/21/23 1659 0     Pain Loc --      Pain Education --      Exclude from Growth Chart --    No data found.  Updated Vital Signs BP (!) 166/94 (BP Location: Right Arm)   Pulse 77   Temp 98.5 F (36.9 C) (Oral)   Resp 16   SpO2 98%   Visual Acuity Right Eye Distance:   Left Eye Distance:   Bilateral Distance:    Right Eye Near:   Left Eye Near:    Bilateral Near:     Physical Exam Vitals and nursing note reviewed.  Constitutional:      General: She is not in acute distress.    Appearance: Normal appearance. She is not ill-appearing, toxic-appearing or diaphoretic.  Cardiovascular:     Rate and Rhythm: Normal rate and regular rhythm.     Heart sounds: Normal heart sounds. No murmur heard. Pulmonary:     Effort: Pulmonary effort is normal. No respiratory distress.     Breath sounds: Normal breath sounds. No wheezing or rhonchi.      UC Treatments / Results  Labs (all labs ordered are listed, but only abnormal results are displayed) Labs Reviewed - No data to display  EKG   Radiology No results found.  Procedures Procedures (including critical care time)  Medications Ordered in UC Medications - No  data to display  Initial Impression / Assessment and Plan /  UC Course  I have reviewed the triage vital signs and the nursing notes.  Pertinent labs & imaging results that were available during my care of the patient were reviewed by me and considered in my medical decision making (see chart for details).  Clinical Course as of 02/21/23 1747  Sun Feb 21, 2023  1723 DG Chest 2 View [KE]    Clinical Course User Index [KE] Doreene Eland, MD    Post-viral cough: Recent diagnosis of COVID-19 infection s/p Paxlovid therapy - Incomplete treatment due to intolerance O2 Sat on RA reassuring Normal Pulm exam Chest xray read by me normal. As discussed with her, I will call her back if the radiology read is abnormal and different from my read. She verbalized understanding. Continue Delsym prn F/U soon if symptoms worsens.  HTN: BP elevated in the settings of cough but asymptomatic Repeat BP improved Per the patient, her home BP had always been good, although no recent check Plan to f/u with PCP this week for BP management Monitor BP closely at home   Final Clinical Impressions(s) / UC Diagnoses   Final diagnoses:  Post-viral cough syndrome  Essential hypertension     Discharge Instructions      It was nice seeing you. 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 Your chest xray reviewed by me is reassuring. However, I will contact you by phone if the radiologist's read is abnormal Continue Delsym as needed for cough.      ED Prescriptions   None    PDMP not reviewed this encounter.   Doreene Eland, MD 02/21/23 [phone]

## 2023-02-21 NOTE — ED Triage Notes (Signed)
Pt reports was Covid + on 7/1. Took OTC Delsym when first positive with covid. . Reports past 2 days had constant cough.

## 2023-02-23 ENCOUNTER — Other Ambulatory Visit: Payer: Self-pay | Admitting: Internal Medicine

## 2023-02-23 DIAGNOSIS — Z1231 Encounter for screening mammogram for malignant neoplasm of breast: Secondary | ICD-10-CM

## 2023-02-25 ENCOUNTER — Ambulatory Visit (HOSPITAL_COMMUNITY)
Admission: RE | Admit: 2023-02-25 | Discharge: 2023-02-25 | Disposition: A | Discharge status: Home / Self Care | Payer: 59 | Source: Ambulatory Visit | Attending: Internal Medicine | Admitting: Internal Medicine

## 2023-02-25 ENCOUNTER — Encounter (HOSPITAL_COMMUNITY): Payer: Self-pay

## 2023-02-25 VITALS — BP 166/85 | HR 82 | Temp 98.3°F | Resp 18

## 2023-02-25 DIAGNOSIS — R0609 Other forms of dyspnea: Secondary | ICD-10-CM

## 2023-02-25 DIAGNOSIS — U099 Post covid-19 condition, unspecified: Secondary | ICD-10-CM

## 2023-02-25 MED ORDER — PREDNISONE 10 MG PO TABS: 20.0000 mg | ORAL_TABLET | Freq: Every day | ORAL | 0 refills | Status: AC

## 2023-02-25 MED ORDER — ALBUTEROL SULFATE HFA 108 (90 BASE) MCG/ACT IN AERS: 1.0000 | INHALATION_SPRAY | Freq: Four times a day (QID) | RESPIRATORY_TRACT | 0 refills | Status: DC | PRN

## 2023-02-25 NOTE — ED Triage Notes (Signed)
Pt states she is here today for follow up from the last visit she states cough is better with delsym but she would like an albuterol inhaler since she still has a cough and is SOB. She states that she just get SOB when she is doing activities and that s new since her covid dx.

## 2023-02-25 NOTE — ED Provider Notes (Signed)
[facility]    CSN: [id number] Arrival date & time: 02/25/23  0847      History   Chief Complaint Chief Complaint  Patient presents with   Cough   Shortness of Breath    HPI Susan Mason is a 62 y.o. female recently diagnosed with COVID-19 infection comes to urgent care with redness of breath on exertion.  Patient was diagnosed with COVID-19 infection in the early part of July.  Cough has improved with Delsym.  Patient denies any wheezing.  No sputum production.  No generalized body aches or headaches.  She has a history of allergies and used to get allergy shots.  She denies any chest tightness.  No nausea, vomiting or diarrhea.  Patient was seen a few days ago and a chest x-ray done was negative for lung infiltrate.  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   Dysphagia 02/02/2023   Fatigue 08/21/2021   Thinning hair 07/31/2021   Brittle nails 07/31/2021   Allergy desensitization therapy 12/26/2020   Other allergic rhinitis 09/17/2020   Essential hypertension 06/16/2018   Osteopenia 04/01/2012    Past Surgical History:  Procedure Laterality Date   COLPOSCOPY  08/03/2006   neg bx   EYE SURGERY     RETINAL DETACHMENT SURGERY Right 01/13/2023    OB History     Gravida  0   Para      Term      Preterm      AB      Living         SAB      IAB      Ectopic      Multiple      Live Births               Home Medications    Prior to Admission medications   Medication Sig Start Date End Date Taking? Authorizing Provider  albuterol (VENTOLIN HFA) 108 (90 Base) MCG/ACT inhaler Inhale 1 puff into the lungs every 6 (six) hours as needed for wheezing or  shortness of breath. 02/25/23  Yes Yolette Hastings, Britta Mccreedy, MD  cetirizine (ZYRTEC) 10 MG tablet Take 10 mg by mouth daily.    Yes [provider]  cholecalciferol (VITAMIN D) 1000 UNITS tablet Take 1,000 Units by mouth daily.   Yes [provider]  lisinopril (ZESTRIL) 5 MG tablet TAKE 1 TABLET BY MOUTH DAILY 08/05/22  Yes Dorothyann Peng, MD  Multiple Vitamins-Minerals (MULTIVITAMIN WITH MINERALS) tablet Take 1 tablet by mouth daily.   Yes [provider]  pantoprazole (PROTONIX) 40 MG tablet Take 1 tablet (40 mg total) by mouth daily. Patient taking differently: Take 40 mg by mouth 2 (two) times daily. 04/02/22 04/02/23 Yes Dorothyann Peng, MD  predniSONE (DELTASONE) 10 MG tablet Take 2 tablets (20 mg total) by mouth daily for 5 days. 02/25/23 03/02/23 Yes Seymore Brodowski, Britta Mccreedy, MD  Probiotic Product (PROBIOTIC-10 PO) Take 1 tablet by mouth daily.   Yes [provider]  brimonidine (ALPHAGAN) 0.2 % ophthalmic solution Place 1 drop into the right eye 3 (three) times daily.    [provider]  prednisoLONE acetate (PRED FORTE) 1 % ophthalmic suspension Place 1 drop into the right eye 6 (six) times daily. 01/21/23   [provider]  timolol (BETIMOL) 0.5 % ophthalmic solution 1 drop in the morning, at noon, and at bedtime.    [provider]    Family History Family History  Problem Relation Age of Onset   Hyperlipidemia Mother    Hypertension Mother    Heart Problems Mother    Kidney disease Mother    Lung cancer Father    Emphysema Father    Breast cancer Neg Hx     Social History Social History   Tobacco Use   Smoking status: Never   Smokeless tobacco: Never  Vaping Use   Vaping status: Never Used  Substance Use Topics   Alcohol use: No   Drug use: No     Allergies   Clindamycin, Nsaids, Penicillins, Doxycycline, Doxycycline calcium, Erythromycin, and Erythromycin base   Review of Systems Review of Systems As per  HPI  Physical Exam Triage Vital Signs ED Triage Vitals  Encounter Vitals Group     BP 02/25/23 0905 (!) 166/85     Systolic BP Percentile --      Diastolic BP Percentile --      Pulse Rate 02/25/23 0905 82     Resp 02/25/23 0905 18     Temp 02/25/23 0905 98.3 F (36.8 C)     Temp Source 02/25/23 0905 Oral     SpO2 02/25/23 0905 98 %     Weight --      Height --      Head Circumference --      Peak Flow --      Pain Score 02/25/23 0903 0     Pain Loc --      Pain Education --      Exclude from Growth Chart --    No data found.  Updated Vital Signs BP (!) 166/85 (BP Location: Right Arm)   Pulse 82   Temp 98.3 F (36.8 C) (Oral)   Resp 18   SpO2 98%   Visual Acuity Right Eye Distance:   Left Eye Distance:   Bilateral Distance:    Right Eye Near:   Left Eye Near:    Bilateral Near:     Physical Exam Vitals and nursing note reviewed.  Constitutional:      General: She is not in acute distress.    Appearance: She is not ill-appearing.     Interventions: She is not intubated. Cardiovascular:     Rate and Rhythm: Normal rate and regular rhythm.     Pulses: Normal pulses.     Heart sounds: Normal heart sounds.  Pulmonary:     Effort: Pulmonary effort is normal. No accessory muscle usage or respiratory distress. She is not intubated.     Breath sounds: Normal breath sounds. No stridor. No decreased breath sounds, wheezing, rhonchi or rales.  Musculoskeletal:     Cervical back: Normal range of motion and neck supple.  Neurological:     Mental Status: She is alert.      UC Treatments / Results  Labs (all labs ordered are listed, but only abnormal results are displayed) Labs Reviewed - No data to display  EKG   Radiology No results found.  Procedures Procedures (including critical care time)  Medications Ordered in UC Medications - No data to display  Initial Impression / Assessment and Plan / UC Course  I have reviewed the triage vital signs and  the nursing notes.  Pertinent labs & imaging results that were available during my care of the patient were reviewed by me and considered in my medical decision making (see chart for details).     1.  Post-COVID chronic dyspnea: Short course of steroids-prednisone 20 mg orally daily for 5 days Albuterol inhaler No indication for chest x-ray since patient had a chest x-ray few days ago Patient's pulse oximetry is 98% and respirations are reassuring. Return precautions given. Final Clinical Impressions(s) / UC Diagnoses   Final diagnoses:  Post-COVID chronic dyspnea     Discharge Instructions      Please take medications as prescribed The shortness of breath is likely related to COVID Short course of steroids will help with the inflammation in your lungs and the inhaler will help with inability to take a deep breath It may take a few weeks for your symptoms to significantly improve If you have worsening symptoms please return to the urgent care to be reevaluated.   ED Prescriptions     Medication Sig Dispense Auth. Provider   predniSONE (DELTASONE) 10 MG tablet Take 2 tablets (20 mg total) by mouth daily for 5 days. 10 tablet Cindi Ghazarian, Britta Mccreedy, MD   albuterol (VENTOLIN HFA) 108 (90 Base) MCG/ACT inhaler Inhale 1 puff into the lungs every 6 (six) hours as needed for wheezing or shortness of breath. 16 g Jerauld Bostwick, Britta Mccreedy, MD      PDMP not reviewed this encounter.   Merrilee Jansky, MD 02/25/23 [phone]

## 2023-02-25 NOTE — Discharge Instructions (Addendum)
Please take medications as prescribed The shortness of breath is likely related to COVID Short course of steroids will help with the inflammation in your lungs and the inhaler will help with inability to take a deep breath It may take a few weeks for your symptoms to significantly improve If you have worsening symptoms please return to the urgent care to be reevaluated.

## 2023-02-26 ENCOUNTER — Ambulatory Visit
Admission: RE | Admit: 2023-02-26 | Discharge: 2023-02-26 | Disposition: A | Discharge status: Home / Self Care | Payer: 59 | Source: Ambulatory Visit | Attending: Internal Medicine | Admitting: Internal Medicine

## 2023-02-26 DIAGNOSIS — E349 Endocrine disorder, unspecified: Secondary | ICD-10-CM | POA: Diagnosis not present

## 2023-02-26 DIAGNOSIS — M85851 Other specified disorders of bone density and structure, right thigh: Secondary | ICD-10-CM

## 2023-02-26 DIAGNOSIS — M8588 Other specified disorders of bone density and structure, other site: Secondary | ICD-10-CM | POA: Diagnosis not present

## 2023-02-26 DIAGNOSIS — N958 Other specified menopausal and perimenopausal disorders: Secondary | ICD-10-CM | POA: Diagnosis not present

## 2023-03-02 ENCOUNTER — Ambulatory Visit: Payer: 59 | Admitting: Internal Medicine

## 2023-03-04 ENCOUNTER — Other Ambulatory Visit: Payer: Self-pay

## 2023-03-04 DIAGNOSIS — E049 Nontoxic goiter, unspecified: Secondary | ICD-10-CM

## 2023-03-08 ENCOUNTER — Other Ambulatory Visit: Payer: 59

## 2023-03-08 DIAGNOSIS — H30031 Focal chorioretinal inflammation, peripheral, right eye: Secondary | ICD-10-CM | POA: Diagnosis not present

## 2023-03-08 DIAGNOSIS — H33021 Retinal detachment with multiple breaks, right eye: Secondary | ICD-10-CM | POA: Diagnosis not present

## 2023-03-08 DIAGNOSIS — H2513 Age-related nuclear cataract, bilateral: Secondary | ICD-10-CM | POA: Diagnosis not present

## 2023-03-08 DIAGNOSIS — E049 Nontoxic goiter, unspecified: Secondary | ICD-10-CM

## 2023-03-08 DIAGNOSIS — H35412 Lattice degeneration of retina, left eye: Secondary | ICD-10-CM | POA: Diagnosis not present

## 2023-03-09 LAB — TSH+FREE T4
Free T4: 1.18 ng/dL (ref 0.82–1.77)
TSH: 0.707 u[IU]/mL (ref 0.450–4.500)

## 2023-04-02 DIAGNOSIS — H30031 Focal chorioretinal inflammation, peripheral, right eye: Secondary | ICD-10-CM | POA: Diagnosis not present

## 2023-04-02 DIAGNOSIS — H33021 Retinal detachment with multiple breaks, right eye: Secondary | ICD-10-CM | POA: Diagnosis not present

## 2023-04-02 DIAGNOSIS — H2513 Age-related nuclear cataract, bilateral: Secondary | ICD-10-CM | POA: Diagnosis not present

## 2023-04-02 DIAGNOSIS — H35412 Lattice degeneration of retina, left eye: Secondary | ICD-10-CM | POA: Diagnosis not present

## 2023-04-07 ENCOUNTER — Other Ambulatory Visit: Payer: 59

## 2023-04-07 ENCOUNTER — Ambulatory Visit
Admission: RE | Admit: 2023-04-07 | Discharge: 2023-04-07 | Disposition: A | Discharge status: Home / Self Care | Payer: 59 | Source: Ambulatory Visit | Attending: Internal Medicine | Admitting: Internal Medicine

## 2023-04-07 ENCOUNTER — Other Ambulatory Visit: Payer: Self-pay

## 2023-04-07 DIAGNOSIS — Z0000 Encounter for general adult medical examination without abnormal findings: Secondary | ICD-10-CM

## 2023-04-07 DIAGNOSIS — Z1231 Encounter for screening mammogram for malignant neoplasm of breast: Secondary | ICD-10-CM | POA: Diagnosis not present

## 2023-04-07 LAB — BASIC METABOLIC PANEL
BUN/Creatinine Ratio: 20 (ref 12–28)
BUN: 18 mg/dL (ref 8–27)
CO2: 22 mmol/L (ref 20–29)
Calcium: 9.4 mg/dL (ref 8.7–10.3)
Chloride: 103 mmol/L (ref 96–106)
Creatinine, Ser: 0.92 mg/dL (ref 0.57–1.00)
Glucose: 76 mg/dL (ref 70–99)
Potassium: 4.1 mmol/L (ref 3.5–5.2)
Sodium: 139 mmol/L (ref 134–144)
eGFR: 70 mL/min/{1.73_m2} (ref >59)

## 2023-04-07 LAB — LIPID PANEL
Chol/HDL Ratio: 3.2 ratio (ref 0.0–4.4)
Cholesterol, Total: 205 mg/dL — ABNORMAL HIGH (ref 100–199)
HDL: 64 mg/dL (ref >39)
LDL Chol Calc (NIH): 126 mg/dL — ABNORMAL HIGH (ref 0–99)
Triglycerides: 82 mg/dL (ref 0–149)
VLDL Cholesterol Cal: 15 mg/dL (ref 5–40)

## 2023-04-09 NOTE — Patient Instructions (Incomplete)

## 2023-04-12 ENCOUNTER — Ambulatory Visit (INDEPENDENT_AMBULATORY_CARE_PROVIDER_SITE_OTHER): Payer: 59 | Admitting: Internal Medicine

## 2023-04-12 ENCOUNTER — Encounter: Payer: Self-pay | Admitting: Internal Medicine

## 2023-04-12 VITALS — BP 110/70 | HR 67 | Temp 98.0°F | Ht 61.0 in | Wt 97.4 lb

## 2023-04-12 DIAGNOSIS — Z0001 Encounter for general adult medical examination with abnormal findings: Secondary | ICD-10-CM | POA: Diagnosis not present

## 2023-04-12 DIAGNOSIS — I10 Essential (primary) hypertension: Secondary | ICD-10-CM | POA: Diagnosis not present

## 2023-04-12 DIAGNOSIS — H3321 Serous retinal detachment, right eye: Secondary | ICD-10-CM | POA: Insufficient documentation

## 2023-04-12 DIAGNOSIS — Z8249 Family history of ischemic heart disease and other diseases of the circulatory system: Secondary | ICD-10-CM | POA: Diagnosis not present

## 2023-04-12 DIAGNOSIS — E049 Nontoxic goiter, unspecified: Secondary | ICD-10-CM

## 2023-04-12 DIAGNOSIS — H209 Unspecified iridocyclitis: Secondary | ICD-10-CM | POA: Diagnosis not present

## 2023-04-12 DIAGNOSIS — Z0000 Encounter for general adult medical examination without abnormal findings: Secondary | ICD-10-CM | POA: Insufficient documentation

## 2023-04-12 LAB — POCT URINALYSIS DIPSTICK
Bilirubin, UA: NEGATIVE
Blood, UA: NEGATIVE
Glucose, UA: NEGATIVE
Ketones, UA: NEGATIVE
Leukocytes, UA: NEGATIVE
Nitrite, UA: NEGATIVE
Protein, UA: NEGATIVE
Spec Grav, UA: 1.02 (ref 1.010–1.025)
Urobilinogen, UA: 0.2 U/dL
pH, UA: 7 (ref 5.0–8.0)

## 2023-04-12 NOTE — Assessment & Plan Note (Signed)
She has family history of heart disease on her mother's side.  I will check Lp(a) today. She has had calcium score, ZERO which is great.

## 2023-04-12 NOTE — Assessment & Plan Note (Signed)
Chronic, most recent thyroid ultrasound reviewed in detail per her request.

## 2023-04-12 NOTE — Progress Notes (Unsigned)
I,Susan Mason, CMA,acting as a Neurosurgeon for Susan Aliment, MD.,have documented all relevant documentation on the behalf of Susan Aliment, MD,as directed by  Susan Aliment, MD while in the presence of Susan Aliment, MD.  Subjective:    Patient ID: Susan Mason , female    DOB: 08/25/1960 , 62 y.o.   MRN: [id number]  Chief Complaint  Patient presents with   Annual Exam   Hypertension    HPI  She is here today for a full physical examination. She is followed by [id number] for her pelvic exams. She has no specific concerns or complaints at this time. Patient reports compliance with medications. Denies headache, chest pain, and SOB.   Letter sent to GYN for pap result.         Hypertension This is a chronic problem. The current episode started more than 1 year ago. The problem has been gradually improving since onset. The problem is controlled. Associated symptoms include neck pain. Pertinent negatives include no blurred vision, chest pain, palpitations or shortness of breath. Risk factors for coronary artery disease include post-menopausal state. Past treatments include ACE inhibitors. The current treatment provides moderate improvement. There are no compliance problems.      Past Medical History:  Diagnosis Date   Abnormal Pap smear 08/03/2006   ASCUS   Granuloma annulare    High blood pressure    Metrorrhagia    Osteopenia    Ovarian cyst    Yeast vaginitis      Family History  Problem Relation Age of Onset   Hyperlipidemia Mother    Hypertension Mother    Heart Problems Mother    Kidney disease Mother    Lung cancer Father    Emphysema Father    Breast cancer Neg Hx      Current Outpatient Medications:    albuterol (VENTOLIN HFA) 108 (90 Base) MCG/ACT inhaler, Inhale 1 puff into the lungs every 6 (six) hours as needed for wheezing or shortness of breath., Disp: 16 g, Rfl: 0   azaTHIOprine (IMURAN) 50 MG tablet, Take 50 mg by mouth daily.,  Disp: , Rfl:    cetirizine (ZYRTEC) 10 MG tablet, Take 10 mg by mouth daily. , Disp: , Rfl:    cholecalciferol (VITAMIN D) 1000 UNITS tablet, Take 1,000 Units by mouth daily., Disp: , Rfl:    lisinopril (ZESTRIL) 5 MG tablet, TAKE 1 TABLET BY MOUTH DAILY, Disp: 90 tablet, Rfl: 2   Multiple Vitamins-Minerals (MULTIVITAMIN WITH MINERALS) tablet, Take 1 tablet by mouth daily., Disp: , Rfl:    nepafenac (NEVANAC) 0.1 % ophthalmic suspension, Place 1 drop into the right eye once., Disp: , Rfl:    Probiotic Product (PROBIOTIC-10 PO), Take 1 tablet by mouth daily., Disp: , Rfl:    brimonidine (ALPHAGAN) 0.2 % ophthalmic solution, Place 1 drop into the right eye 3 (three) times daily. (Patient not taking: Reported on 04/12/2023), Disp: , Rfl:    pantoprazole (PROTONIX) 40 MG tablet, Take 1 tablet (40 mg total) by mouth daily. (Patient taking differently: Take 40 mg by mouth 2 (two) times daily.), Disp: 30 tablet, Rfl: 1   prednisoLONE acetate (PRED FORTE) 1 % ophthalmic suspension, Place 1 drop into the right eye 6 (six) times daily. (Patient not taking: Reported on 04/12/2023), Disp: , Rfl:    timolol (BETIMOL) 0.5 % ophthalmic solution, 1 drop in the morning, at noon, and at bedtime. (Patient not taking: Reported on 04/12/2023), Disp: , Rfl:  Allergies  Allergen Reactions   Clindamycin Rash   Nsaids Other (See Comments)   Penicillins    Doxycycline    Doxycycline Calcium Hives   Erythromycin    Erythromycin Base       The patient states she uses none for birth control. No LMP recorded. Patient is postmenopausal.. Negative for Dysmenorrhea. Negative for: breast discharge, breast lump(s), breast pain and breast self exam. Associated symptoms include abnormal vaginal bleeding. Pertinent negatives include abnormal bleeding (hematology), anxiety, decreased libido, depression, difficulty falling sleep, dyspareunia, history of infertility, nocturia, sexual dysfunction, sleep disturbances, urinary  incontinence, urinary urgency, vaginal discharge and vaginal itching. Diet regular.The patient states her exercise level is  decreased due to recent eye surgery.    . The patient's tobacco use is:  Social History   Tobacco Use  Smoking Status Never  Smokeless Tobacco Never  . She has been exposed to passive smoke. The patient's alcohol use is:  Social History   Substance and Sexual Activity  Alcohol Use No    Review of Systems  Constitutional: Negative.   HENT: Negative.    Eyes: Negative.  Negative for blurred vision.  Respiratory: Negative.  Negative for shortness of breath.   Cardiovascular: Negative.  Negative for chest pain and palpitations.  Gastrointestinal: Negative.   Endocrine: Negative.   Genitourinary: Negative.   Musculoskeletal:  Positive for neck pain.  Skin: Negative.   Allergic/Immunologic: Negative.   Neurological: Negative.   Hematological: Negative.   Psychiatric/Behavioral: Negative.       Today's Vitals   04/12/23 0851  BP: 110/70  Pulse: 67  Temp: 98 F (36.7 C)  SpO2: 97%  Weight: 97 lb 6.4 oz (44.2 kg)  Height: 5\' 1"  (1.549 m)   Body mass index is 18.4 kg/m.  Wt Readings from Last 3 Encounters:  04/12/23 97 lb 6.4 oz (44.2 kg)  02/01/23 98 lb 9.6 oz (44.7 kg)  10/01/22 98 lb 6.4 oz (44.6 kg)    The 10-year ASCVD risk score (Arnett DK, et al., 2019) is: 4.2%   Values used to calculate the score:     Age: 45 years     Sex: Female     Is Non-Hispanic African American: No     Diabetic: No     Tobacco smoker: No     Systolic Blood Pressure: 110 mmHg     Is BP treated: Yes     HDL Cholesterol: 64 mg/dL     Total Cholesterol: 205 mg/dL   Objective:  Physical Exam Vitals and nursing note reviewed.  Constitutional:      Appearance: Normal appearance.  HENT:     Head: Normocephalic and atraumatic.     Right Ear: Tympanic membrane, ear canal and external ear normal.     Left Ear: Tympanic membrane, ear canal and external ear normal.      Nose: Nose normal.     Mouth/Throat:     Mouth: Mucous membranes are moist.     Pharynx: Oropharynx is clear.  Eyes:     Extraocular Movements: Extraocular movements intact.     Conjunctiva/sclera: Conjunctivae normal.     Pupils: Pupils are equal, round, and reactive to light.  Cardiovascular:     Rate and Rhythm: Normal rate and regular rhythm.     Pulses: Normal pulses.     Heart sounds: Normal heart sounds.  Pulmonary:     Effort: Pulmonary effort is normal.     Breath sounds: Normal breath sounds.  Chest:  Breasts:    Tanner Score is 5.     Right: Normal.     Left: Normal.  Abdominal:     General: Abdomen is flat. Bowel sounds are normal.     Palpations: Abdomen is soft.  Genitourinary:    Comments: deferred Musculoskeletal:        General: Normal range of motion.     Cervical back: Normal range of motion and neck supple.  Skin:    General: Skin is warm and dry.  Neurological:     General: No focal deficit present.     Mental Status: She is alert and oriented to person, place, and time.  Psychiatric:        Mood and Affect: Mood normal.        Behavior: Behavior normal.         Assessment And Plan:     Annual physical exam Assessment & Plan: A full exam was performed.  Importance of monthly self breast exams was discussed with the patient.  She is advised to get 30-45 minutes of regular exercise, no less than four to five days per week. Both weight-bearing and aerobic exercises are recommended.  She is advised to follow a healthy diet with at least six fruits/veggies per day, decrease intake of red meat and other saturated fats and to increase fish intake to twice weekly.  Meats/fish should not be fried -- baked, boiled or broiled is preferable. It is also important to cut back on your sugar intake.  Be sure to read labels - try to avoid anything with added sugar, high fructose corn syrup or other sweeteners.  If you must use a sweetener, you can try stevia or  monkfruit.  It is also important to avoid artificially sweetened foods/beverages and diet drinks. Lastly, wear SPF 50 sunscreen on exposed skin and when in direct sunlight for an extended period of time.  Be sure to avoid fast food restaurants and aim for at least 60 ounces of water daily.       Essential hypertension Assessment & Plan: Chronic, well controlled. EKG performed, NSR w/o acute changes. Advised to follow low sodium diet. She will c/w lisinopril 5mg  daily. Advised to follow low sodium diet.   Orders: -     POCT urinalysis dipstick -     Microalbumin / creatinine urine ratio -     EKG 12-Lead  Goiter Assessment & Plan: Chronic, most recent thyroid ultrasound reviewed in detail per her request.    Uveitis of right eye -     Ambulatory referral to Ophthalmology  Family history of chronic ischemic heart disease Assessment & Plan: She has family history of heart disease on her mother's side.  I will check Lp(a) today. She has had calcium score, ZERO which is great.   Orders: -     Lipoprotein A (LPA)     Return if symptoms worsen or fail to improve. Patient was given opportunity to ask questions. Patient verbalized understanding of the plan and was able to repeat key elements of the plan. All questions were answered to their satisfaction.    I, Susan Aliment, MD, have reviewed all documentation for this visit. The documentation on 04/12/23 for the exam, diagnosis, procedures, and orders are all accurate and complete.

## 2023-04-12 NOTE — Assessment & Plan Note (Signed)
Chronic, well controlled. EKG performed, NSR w/o acute changes. Advised to follow low sodium diet. She will c/w lisinopril 5mg  daily. Advised to follow low sodium diet.

## 2023-04-12 NOTE — Assessment & Plan Note (Signed)

## 2023-04-13 LAB — MICROALBUMIN / CREATININE URINE RATIO
Creatinine, Urine: 33.9 mg/dL
Microalb/Creat Ratio: 11 mg/g{creat} (ref 0–29)
Microalbumin, Urine: 3.8 ug/mL

## 2023-04-13 LAB — LIPOPROTEIN A (LPA): Lipoprotein (a): <8.4 nmol/L (ref <75.0)

## 2023-04-13 NOTE — Assessment & Plan Note (Signed)
Recent diagnosis, most recent Ophthalmology notes reviewed in Care Everywhere. I will refer her to [facility] as requested.

## 2023-04-16 DIAGNOSIS — H30031 Focal chorioretinal inflammation, peripheral, right eye: Secondary | ICD-10-CM | POA: Diagnosis not present

## 2023-04-20 DIAGNOSIS — H3321 Serous retinal detachment, right eye: Secondary | ICD-10-CM | POA: Diagnosis not present

## 2023-04-21 DIAGNOSIS — H3341 Traction detachment of retina, right eye: Secondary | ICD-10-CM | POA: Diagnosis not present

## 2023-04-22 DIAGNOSIS — H3321 Serous retinal detachment, right eye: Secondary | ICD-10-CM | POA: Diagnosis not present

## 2023-04-30 DIAGNOSIS — H30031 Focal chorioretinal inflammation, peripheral, right eye: Secondary | ICD-10-CM | POA: Diagnosis not present

## 2023-04-30 DIAGNOSIS — H33021 Retinal detachment with multiple breaks, right eye: Secondary | ICD-10-CM | POA: Diagnosis not present

## 2023-04-30 DIAGNOSIS — H35412 Lattice degeneration of retina, left eye: Secondary | ICD-10-CM | POA: Diagnosis not present

## 2023-04-30 DIAGNOSIS — H2513 Age-related nuclear cataract, bilateral: Secondary | ICD-10-CM | POA: Diagnosis not present

## 2023-05-11 DIAGNOSIS — H43822 Vitreomacular adhesion, left eye: Secondary | ICD-10-CM | POA: Diagnosis not present

## 2023-05-11 DIAGNOSIS — H3521 Other non-diabetic proliferative retinopathy, right eye: Secondary | ICD-10-CM | POA: Diagnosis not present

## 2023-05-11 DIAGNOSIS — H2011 Chronic iridocyclitis, right eye: Secondary | ICD-10-CM | POA: Diagnosis not present

## 2023-05-11 DIAGNOSIS — H33021 Retinal detachment with multiple breaks, right eye: Secondary | ICD-10-CM | POA: Diagnosis not present

## 2023-05-18 DIAGNOSIS — H2513 Age-related nuclear cataract, bilateral: Secondary | ICD-10-CM | POA: Diagnosis not present

## 2023-05-18 DIAGNOSIS — H35412 Lattice degeneration of retina, left eye: Secondary | ICD-10-CM | POA: Diagnosis not present

## 2023-05-18 DIAGNOSIS — H33021 Retinal detachment with multiple breaks, right eye: Secondary | ICD-10-CM | POA: Diagnosis not present

## 2023-05-18 DIAGNOSIS — H30031 Focal chorioretinal inflammation, peripheral, right eye: Secondary | ICD-10-CM | POA: Diagnosis not present

## 2023-05-18 DIAGNOSIS — Z79899 Other long term (current) drug therapy: Secondary | ICD-10-CM | POA: Diagnosis not present

## 2023-05-25 DIAGNOSIS — H21541 Posterior synechiae (iris), right eye: Secondary | ICD-10-CM | POA: Diagnosis not present

## 2023-05-25 DIAGNOSIS — H33021 Retinal detachment with multiple breaks, right eye: Secondary | ICD-10-CM | POA: Diagnosis not present

## 2023-05-25 DIAGNOSIS — H35412 Lattice degeneration of retina, left eye: Secondary | ICD-10-CM | POA: Diagnosis not present

## 2023-05-25 DIAGNOSIS — Z79899 Other long term (current) drug therapy: Secondary | ICD-10-CM | POA: Diagnosis not present

## 2023-05-25 DIAGNOSIS — H4020X0 Unspecified primary angle-closure glaucoma, stage unspecified: Secondary | ICD-10-CM | POA: Diagnosis not present

## 2023-05-25 DIAGNOSIS — H30031 Focal chorioretinal inflammation, peripheral, right eye: Secondary | ICD-10-CM | POA: Diagnosis not present

## 2023-05-25 DIAGNOSIS — H2513 Age-related nuclear cataract, bilateral: Secondary | ICD-10-CM | POA: Diagnosis not present

## 2023-05-27 DIAGNOSIS — Z79899 Other long term (current) drug therapy: Secondary | ICD-10-CM | POA: Diagnosis not present

## 2023-05-27 DIAGNOSIS — H21541 Posterior synechiae (iris), right eye: Secondary | ICD-10-CM | POA: Diagnosis not present

## 2023-05-27 DIAGNOSIS — H35412 Lattice degeneration of retina, left eye: Secondary | ICD-10-CM | POA: Diagnosis not present

## 2023-05-27 DIAGNOSIS — H2513 Age-related nuclear cataract, bilateral: Secondary | ICD-10-CM | POA: Diagnosis not present

## 2023-05-27 DIAGNOSIS — H4020X0 Unspecified primary angle-closure glaucoma, stage unspecified: Secondary | ICD-10-CM | POA: Diagnosis not present

## 2023-05-27 DIAGNOSIS — H33021 Retinal detachment with multiple breaks, right eye: Secondary | ICD-10-CM | POA: Diagnosis not present

## 2023-05-27 DIAGNOSIS — H30031 Focal chorioretinal inflammation, peripheral, right eye: Secondary | ICD-10-CM | POA: Diagnosis not present

## 2023-06-01 DIAGNOSIS — H30031 Focal chorioretinal inflammation, peripheral, right eye: Secondary | ICD-10-CM | POA: Diagnosis not present

## 2023-06-01 DIAGNOSIS — H35412 Lattice degeneration of retina, left eye: Secondary | ICD-10-CM | POA: Diagnosis not present

## 2023-06-01 DIAGNOSIS — H33021 Retinal detachment with multiple breaks, right eye: Secondary | ICD-10-CM | POA: Diagnosis not present

## 2023-06-01 DIAGNOSIS — H4020X0 Unspecified primary angle-closure glaucoma, stage unspecified: Secondary | ICD-10-CM | POA: Diagnosis not present

## 2023-06-01 DIAGNOSIS — Z79899 Other long term (current) drug therapy: Secondary | ICD-10-CM | POA: Diagnosis not present

## 2023-06-01 DIAGNOSIS — H2513 Age-related nuclear cataract, bilateral: Secondary | ICD-10-CM | POA: Diagnosis not present

## 2023-06-01 DIAGNOSIS — H21541 Posterior synechiae (iris), right eye: Secondary | ICD-10-CM | POA: Diagnosis not present

## 2023-06-07 ENCOUNTER — Ambulatory Visit (INDEPENDENT_AMBULATORY_CARE_PROVIDER_SITE_OTHER): Payer: 59 | Admitting: Internal Medicine

## 2023-06-07 ENCOUNTER — Encounter: Payer: Self-pay | Admitting: Internal Medicine

## 2023-06-07 VITALS — BP 142/90 | HR 62 | Temp 97.5°F | Ht 61.0 in | Wt 96.8 lb

## 2023-06-07 DIAGNOSIS — L659 Nonscarring hair loss, unspecified: Secondary | ICD-10-CM

## 2023-06-07 DIAGNOSIS — H3321 Serous retinal detachment, right eye: Secondary | ICD-10-CM

## 2023-06-07 DIAGNOSIS — I10 Essential (primary) hypertension: Secondary | ICD-10-CM | POA: Diagnosis not present

## 2023-06-07 DIAGNOSIS — Z2821 Immunization not carried out because of patient refusal: Secondary | ICD-10-CM

## 2023-06-07 MED ORDER — LISINOPRIL 10 MG PO TABS: 10.0000 mg | ORAL_TABLET | Freq: Every day | ORAL | 1 refills | Status: DC

## 2023-06-07 NOTE — Assessment & Plan Note (Signed)
Possibly related to steroid gtt. Will check labs as below and supplement iron as needed.

## 2023-06-07 NOTE — Assessment & Plan Note (Addendum)
Chronic, uncontrolled. I will increase lisinopril to 10mg  daily. She agrees to rto in 2 weeks for re-evaluation. I will recheck a BMP at that time. I suspect her BP elevation may be related to steroid gtt. I plan to see her again in six weeks.

## 2023-06-07 NOTE — Assessment & Plan Note (Signed)
Chronic, followed by [facility].  She did go to [facility] for second opinion who agreed with their treatment plan. Unfortunately, this appears to have exacerbated her cataract. She is planning for cataract extraction in Feb 2025.

## 2023-06-07 NOTE — Progress Notes (Signed)
I,[name], CMA,acting as a Neurosurgeon for [name], MD.,have documented all relevant documentation on the behalf of [name], MD,as directed by  [name], MD while in the presence of [name], MD.  Subjective:  Patient ID: Susan Mason , female    DOB: 09-06-1960 , 62 y.o.   MRN: [id number]  Chief Complaint  Patient presents with   Hypertension    HPI  The patient is here today for a blood pressure follow-up. She reports compliance with meds.Patient would like to know if she takes fish oil would that help lower her LDL. She also reports she is losing her hair and she thinks it is the eyedrops she is using. She was advised that the steroid drops could affect her hair.   Since her last visit, she has had a second surgery for detached retina (right). She states she still has decreased vision in her right eye. Now has worsening of her cataract, doesn't plan to have surgical repair in Feb 2025.   Hypertension This is a chronic problem. The current episode started more than 1 year ago. The problem is unchanged. The problem is controlled. Pertinent negatives include no anxiety or malaise/fatigue. Past treatments include ACE inhibitors. There are no compliance problems.  There is no history of CAD/MI.     Past Medical History:  Diagnosis Date   Abnormal Pap smear 08/03/2006   ASCUS   Granuloma annulare    High blood pressure    Metrorrhagia    Osteopenia    Ovarian cyst    Yeast vaginitis      Family History  Problem Relation Age of Onset   Hyperlipidemia Mother    Hypertension Mother    Heart Problems Mother    Kidney disease Mother    Lung cancer Father    Emphysema Father    Breast cancer Neg Hx      Current Outpatient Medications:    brimonidine (ALPHAGAN) 0.2 % ophthalmic solution, Place 1 drop into the right eye 3 (three) times daily., Disp: , Rfl:    cetirizine (ZYRTEC) 10 MG tablet, Take 10 mg by mouth daily. , Disp: , Rfl:     cholecalciferol (VITAMIN D) 1000 UNITS tablet, Take 1,000 Units by mouth daily., Disp: , Rfl:    lisinopril (ZESTRIL) 10 MG tablet, Take 1 tablet (10 mg total) by mouth daily., Disp: 90 tablet, Rfl: 1   Multiple Vitamins-Minerals (MULTIVITAMIN WITH MINERALS) tablet, Take 1 tablet by mouth daily., Disp: , Rfl:    prednisoLONE acetate (PRED FORTE) 1 % ophthalmic suspension, Place 1 drop into the right eye 6 (six) times daily., Disp: , Rfl:    Probiotic Product (PROBIOTIC-10 PO), Take 1 tablet by mouth daily., Disp: , Rfl:    timolol (BETIMOL) 0.5 % ophthalmic solution, 1 drop in the morning, at noon, and at bedtime., Disp: , Rfl:    Allergies  Allergen Reactions   Clindamycin Rash   Nsaids Other (See Comments)   Penicillins    Doxycycline    Doxycycline Calcium Hives   Erythromycin    Erythromycin Base      Review of Systems  Constitutional: Negative.  Negative for malaise/fatigue.  Eyes: Negative.   Respiratory: Negative.    Cardiovascular: Negative.   Gastrointestinal: Negative.   Musculoskeletal: Negative.   Skin: Negative.   Psychiatric/Behavioral: Negative.       Today's Vitals   06/07/23 0929 06/07/23 0955  BP: (!) 160/80 (!) 142/90  Pulse: 62  Temp: (!) 97.5 F (36.4 C)   Weight: 96 lb 12.8 oz (43.9 kg)   Height: 5\' 1"  (1.549 m)   PainSc: 0-No pain    Body mass index is 18.29 kg/m.  Wt Readings from Last 3 Encounters:  06/07/23 96 lb 12.8 oz (43.9 kg)  04/12/23 97 lb 6.4 oz (44.2 kg)  02/01/23 98 lb 9.6 oz (44.7 kg)   BP Readings from Last 3 Encounters:  06/07/23 (!) 142/90  04/12/23 110/70  02/25/23 (!) 166/85      The 10-year ASCVD risk score (Arnett DK, et al., 2019) is: 7%   Values used to calculate the score:     Age: 8 years     Sex: Female     Is Non-Hispanic African American: No     Diabetic: No     Tobacco smoker: No     Systolic Blood Pressure: 142 mmHg     Is BP treated: Yes     HDL Cholesterol: 64 mg/dL     Total Cholesterol: 205  mg/dL  Objective:  Physical Exam Vitals and nursing note reviewed.  Constitutional:      Appearance: Normal appearance.  HENT:     Head: Normocephalic and atraumatic.  Eyes:     Extraocular Movements: Extraocular movements intact.  Cardiovascular:     Rate and Rhythm: Normal rate and regular rhythm.     Heart sounds: Normal heart sounds.  Pulmonary:     Effort: Pulmonary effort is normal.     Breath sounds: Normal breath sounds.  Musculoskeletal:     Cervical back: Normal range of motion.  Skin:    General: Skin is warm.  Neurological:     General: No focal deficit present.     Mental Status: She is alert.  Psychiatric:        Mood and Affect: Mood normal.        Behavior: Behavior normal.         Assessment And Plan:  Essential hypertension Assessment & Plan: Chronic, uncontrolled. I will increase lisinopril to 10mg  daily. She agrees to rto in 2 weeks for re-evaluation. I will recheck a BMP at that time. I suspect her BP elevation may be related to steroid gtt. I plan to see her again in six weeks.   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  She did go to [facility] for second opinion who agreed with their treatment plan. Unfortunately, this appears to have exacerbated her cataract. She is planning for cataract extraction in Feb 2025.    Hair loss Assessment & Plan: Possibly related to steroid gtt. Will check labs as below and supplement iron as needed.   Orders: -     CBC -     Iron, TIBC and Ferritin Panel  COVID-19 vaccination declined  Other orders -     Lisinopril; Take 1 tablet (10 mg total) by mouth daily.  Dispense: 90 tablet; Refill: 1    Return in about 2 weeks (around 06/21/2023), or bp check NV and lab visit, for six weeks - bp check w/ rs.  Patient was given opportunity to ask questions. Patient verbalized understanding of the plan and was able to repeat key elements  of the plan. All questions were answered to their satisfaction.    I, [name], MD, have reviewed all documentation for this visit. The documentation on 06/07/23 for the exam, diagnosis, procedures, and orders  are all accurate and complete.   IF YOU HAVE BEEN REFERRED TO A SPECIALIST, IT MAY TAKE 1-2 WEEKS TO SCHEDULE/PROCESS THE REFERRAL. IF YOU HAVE NOT HEARD FROM US/SPECIALIST IN TWO WEEKS, PLEASE GIVE [place] A CALL AT [phone].

## 2023-06-08 ENCOUNTER — Encounter: Payer: Self-pay | Admitting: Internal Medicine

## 2023-06-08 LAB — CBC
Hematocrit: 36.7 % (ref 34.0–46.6)
Hemoglobin: 12.2 g/dL (ref 11.1–15.9)
MCH: 30.7 pg (ref 26.6–33.0)
MCHC: 33.2 g/dL (ref 31.5–35.7)
MCV: 92 fL (ref 79–97)
Platelets: 309 10*3/uL (ref 150–450)
RBC: 3.98 x10E6/uL (ref 3.77–5.28)
RDW: 13 % (ref 11.7–15.4)
WBC: 5.9 10*3/uL (ref 3.4–10.8)

## 2023-06-08 LAB — IRON,TIBC AND FERRITIN PANEL
Ferritin: 96 ng/mL (ref 15–150)
Iron Saturation: 35 % (ref 15–55)
Iron: 107 ug/dL (ref 27–139)
Total Iron Binding Capacity: 310 ug/dL (ref 250–450)
UIBC: 203 ug/dL (ref 118–369)

## 2023-06-15 DIAGNOSIS — H4020X0 Unspecified primary angle-closure glaucoma, stage unspecified: Secondary | ICD-10-CM | POA: Diagnosis not present

## 2023-06-15 DIAGNOSIS — H35412 Lattice degeneration of retina, left eye: Secondary | ICD-10-CM | POA: Diagnosis not present

## 2023-06-15 DIAGNOSIS — Z79899 Other long term (current) drug therapy: Secondary | ICD-10-CM | POA: Diagnosis not present

## 2023-06-15 DIAGNOSIS — H33021 Retinal detachment with multiple breaks, right eye: Secondary | ICD-10-CM | POA: Diagnosis not present

## 2023-06-15 DIAGNOSIS — H2513 Age-related nuclear cataract, bilateral: Secondary | ICD-10-CM | POA: Diagnosis not present

## 2023-06-15 DIAGNOSIS — H21541 Posterior synechiae (iris), right eye: Secondary | ICD-10-CM | POA: Diagnosis not present

## 2023-06-15 DIAGNOSIS — H30031 Focal chorioretinal inflammation, peripheral, right eye: Secondary | ICD-10-CM | POA: Diagnosis not present

## 2023-06-22 ENCOUNTER — Ambulatory Visit: Payer: 59

## 2023-06-22 ENCOUNTER — Other Ambulatory Visit: Payer: Self-pay

## 2023-06-29 ENCOUNTER — Ambulatory Visit: Payer: 59

## 2023-06-29 ENCOUNTER — Other Ambulatory Visit: Payer: 59

## 2023-06-29 VITALS — BP 120/70 | HR 63 | Temp 98.1°F | Ht 61.0 in | Wt 96.0 lb

## 2023-06-29 DIAGNOSIS — I10 Essential (primary) hypertension: Secondary | ICD-10-CM

## 2023-06-29 NOTE — Progress Notes (Signed)
Patient presents today for a blood pressure check. Patient reports she takes her lisinopril 10mg  in the mornings. I checked her blood pressure and it was 120/70 P63. Per provider Blood pressure looks great patient is to continue with her current medications and keep her follow up as scheduled. YL,RMA    BP Readings from Last 3 Encounters:  06/07/23 (!) 142/90  04/12/23 110/70  02/25/23 (!) 166/85

## 2023-06-30 LAB — BASIC METABOLIC PANEL
BUN/Creatinine Ratio: 28 (ref 12–28)
BUN: 21 mg/dL (ref 8–27)
CO2: 24 mmol/L (ref 20–29)
Calcium: 9.7 mg/dL (ref 8.7–10.3)
Chloride: 103 mmol/L (ref 96–106)
Creatinine, Ser: 0.74 mg/dL (ref 0.57–1.00)
Glucose: 81 mg/dL (ref 70–99)
Potassium: 4.6 mmol/L (ref 3.5–5.2)
Sodium: 140 mmol/L (ref 134–144)
eGFR: 91 mL/min/{1.73_m2} (ref >59)

## 2023-07-27 DIAGNOSIS — H3341 Traction detachment of retina, right eye: Secondary | ICD-10-CM | POA: Diagnosis not present

## 2023-07-27 DIAGNOSIS — H2511 Age-related nuclear cataract, right eye: Secondary | ICD-10-CM | POA: Diagnosis not present

## 2023-07-30 DIAGNOSIS — H2511 Age-related nuclear cataract, right eye: Secondary | ICD-10-CM | POA: Diagnosis not present

## 2023-07-30 DIAGNOSIS — H3341 Traction detachment of retina, right eye: Secondary | ICD-10-CM | POA: Diagnosis not present

## 2023-07-30 DIAGNOSIS — T85398A Other mechanical complication of other ocular prosthetic devices, implants and grafts, initial encounter: Secondary | ICD-10-CM | POA: Diagnosis not present

## 2023-07-30 DIAGNOSIS — H21541 Posterior synechiae (iris), right eye: Secondary | ICD-10-CM | POA: Diagnosis not present

## 2023-08-17 ENCOUNTER — Encounter: Payer: Self-pay | Admitting: Internal Medicine

## 2023-08-17 ENCOUNTER — Ambulatory Visit: Payer: 59 | Admitting: Internal Medicine

## 2023-08-17 VITALS — BP 110/78 | HR 66 | Temp 97.6°F | Ht 61.0 in | Wt 96.0 lb

## 2023-08-17 DIAGNOSIS — H3321 Serous retinal detachment, right eye: Secondary | ICD-10-CM

## 2023-08-17 DIAGNOSIS — I10 Essential (primary) hypertension: Secondary | ICD-10-CM | POA: Diagnosis not present

## 2023-08-17 NOTE — Progress Notes (Signed)
 I,Victoria T Emmitt, CMA,acting as a neurosurgeon for Catheryn LOISE Slocumb, MD.,have documented all relevant documentation on the behalf of Catheryn LOISE Slocumb, MD,as directed by  Catheryn LOISE Slocumb, MD while in the presence of Catheryn LOISE Slocumb, MD.  Subjective:  Patient ID: Susan Mason , female    DOB: 01/06/1961 , 63 y.o.   MRN: [id number]  Chief Complaint  Patient presents with   Hypertension    HPI  The patient is here today for a blood pressure follow-up. She reports compliance with meds. Denies headache, chest pain & sob.   She reports having eye surgery 2 days after Christmas. She admits the recovery process is tough. She feels she is slowly but surely getting better.   Hypertension This is a chronic problem. The current episode started more than 1 year ago. The problem is unchanged. The problem is controlled. Pertinent negatives include no anxiety or malaise/fatigue. Past treatments include ACE inhibitors. There are no compliance problems.  There is no history of CAD/MI.     Past Medical History:  Diagnosis Date   Abnormal Pap smear 08/03/2006   ASCUS   Granuloma annulare    High blood pressure    Metrorrhagia    Osteopenia    Ovarian cyst    Yeast vaginitis      Family History  Problem Relation Age of Onset   Hyperlipidemia Mother    Hypertension Mother    Heart Problems Mother    Kidney disease Mother    Lung cancer Father    Emphysema Father    Breast cancer Neg Hx      Current Outpatient Medications:    brimonidine (ALPHAGAN) 0.2 % ophthalmic solution, Place 1 drop into the right eye 3 (three) times daily., Disp: , Rfl:    cetirizine (ZYRTEC) 10 MG tablet, Take 10 mg by mouth daily. , Disp: , Rfl:    cholecalciferol (VITAMIN D ) 1000 UNITS tablet, Take 1,000 Units by mouth daily., Disp: , Rfl:    methotrexate (RHEUMATREX) 2.5 MG tablet, Take by mouth., Disp: , Rfl:    Multiple Vitamins-Minerals (MULTIVITAMIN WITH MINERALS) tablet, Take 1 tablet by mouth daily., Disp: ,  Rfl:    prednisoLONE acetate (PRED FORTE) 1 % ophthalmic suspension, Place 1 drop into the right eye 6 (six) times daily., Disp: , Rfl:    Probiotic Product (PROBIOTIC-10 PO), Take 1 tablet by mouth daily., Disp: , Rfl:    timolol (BETIMOL) 0.5 % ophthalmic solution, 1 drop in the morning, at noon, and at bedtime., Disp: , Rfl:    lisinopril  (ZESTRIL ) 10 MG tablet, Take 1 tablet (10 mg total) by mouth daily., Disp: 90 tablet, Rfl: 1   Allergies  Allergen Reactions   Clindamycin Rash   Nsaids Other (See Comments)   Penicillins    Doxycycline    Doxycycline Calcium Hives   Erythromycin    Erythromycin Base      Review of Systems  Constitutional: Negative.  Negative for malaise/fatigue.  Respiratory: Negative.    Cardiovascular: Negative.   Gastrointestinal: Negative.   Neurological: Negative.   Psychiatric/Behavioral: Negative.       Today's Vitals   08/17/23 0930  BP: 110/78  Pulse: 66  Temp: 97.6 F (36.4 C)  SpO2: 98%  Weight: 96 lb (43.5 kg)  Height: 5' 1 (1.549 m)   Body mass index is 18.14 kg/m.  Wt Readings from Last 3 Encounters:  08/17/23 96 lb (43.5 kg)  06/29/23 96 lb (43.5 kg)  06/07/23 96 lb 12.8  oz (43.9 kg)     Objective:  Physical Exam Vitals and nursing note reviewed.  Constitutional:      Appearance: Normal appearance.  HENT:     Head: Normocephalic and atraumatic.  Eyes:     Extraocular Movements: Extraocular movements intact.     Comments: Ptosis r lid Scleral erythema  Cardiovascular:     Rate and Rhythm: Normal rate and regular rhythm.     Heart sounds: Normal heart sounds.  Pulmonary:     Effort: Pulmonary effort is normal.     Breath sounds: Normal breath sounds.  Musculoskeletal:     Cervical back: Normal range of motion.  Skin:    General: Skin is warm.  Neurological:     General: No focal deficit present.     Mental Status: She is alert.  Psychiatric:        Mood and Affect: Mood normal.        Behavior: Behavior normal.          Assessment And Plan:  Essential hypertension Assessment & Plan: Chronic, improved control with lisinopril  10mg  daily. She will continue with current meds. Encouraged to follow low sodium diet. She will rto in May 2025.    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  Unfortunately, she has had post-operative complications. Most recent Ophthalmology notes reviewed.     Other orders -     Lisinopril ; Take 1 tablet (10 mg total) by mouth daily.  Dispense: 90 tablet; Refill: 1     Return in 4 months (on 12/15/2023), or dm check.  Patient was given opportunity to ask questions. Patient verbalized understanding of the plan and was able to repeat key elements of the plan. All questions were answered to their satisfaction.    I, Catheryn LOISE Slocumb, MD, have reviewed all documentation for this visit. The documentation on 08/17/23 for the exam, diagnosis, procedures, and orders are all accurate and complete.   IF YOU HAVE BEEN REFERRED TO A SPECIALIST, IT MAY TAKE 1-2 WEEKS TO SCHEDULE/PROCESS THE REFERRAL. IF YOU HAVE NOT HEARD FROM US /SPECIALIST IN TWO WEEKS, PLEASE GIVE US  A CALL AT [phone].   THE PATIENT IS ENCOURAGED TO PRACTICE SOCIAL DISTANCING DUE TO THE COVID-19 PANDEMIC.

## 2023-08-17 NOTE — Patient Instructions (Signed)
 Hypertension, Adult Hypertension is another name for high blood pressure. High blood pressure forces your heart to work harder to pump blood. This can cause problems over time. There are two numbers in a blood pressure reading. There is a top number (systolic) over a bottom number (diastolic). It is best to have a blood pressure that is below 120/80. What are the causes? The cause of this condition is not known. Some other conditions can lead to high blood pressure. What increases the risk? Some lifestyle factors can make you more likely to develop high blood pressure: Smoking. Not getting enough exercise or physical activity. Being overweight. Having too much fat, sugar, calories, or salt (sodium) in your diet. Drinking too much alcohol. Other risk factors include: Having any of these conditions: Heart disease. Diabetes. High cholesterol. Kidney disease. Obstructive sleep apnea. Having a family history of high blood pressure and high cholesterol. Age. The risk increases with age. Stress. What are the signs or symptoms? High blood pressure may not cause symptoms. Very high blood pressure (hypertensive crisis) may cause: Headache. Fast or uneven heartbeats (palpitations). Shortness of breath. Nosebleed. Vomiting or feeling like you may vomit (nauseous). Changes in how you see. Very bad chest pain. Feeling dizzy. Seizures. How is this treated? This condition is treated by making healthy lifestyle changes, such as: Eating healthy foods. Exercising more. Drinking less alcohol. Your doctor may prescribe medicine if lifestyle changes do not help enough and if: Your top number is above 130. Your bottom number is above 80. Your personal target blood pressure may vary. Follow these instructions at home: Eating and drinking  If told, follow the DASH eating plan. To follow this plan: Fill one half of your plate at each meal with fruits and vegetables. Fill one fourth of your plate  at each meal with whole grains. Whole grains include whole-wheat pasta, brown rice, and whole-grain bread. Eat or drink low-fat dairy products, such as skim milk or low-fat yogurt. Fill one fourth of your plate at each meal with low-fat (lean) proteins. Low-fat proteins include fish, chicken without skin, eggs, beans, and tofu. Avoid fatty meat, cured and processed meat, or chicken with skin. Avoid pre-made or processed food. Limit the amount of salt in your diet to less than 1,500 mg each day. Do not drink alcohol if: Your doctor tells you not to drink. You are pregnant, may be pregnant, or are planning to become pregnant. If you drink alcohol: Limit how much you have to: 0-1 drink a day for women. 0-2 drinks a day for men. Know how much alcohol is in your drink. In the [place] one drink equals one 12 oz bottle of beer (355 mL), one 5 oz glass of wine (148 mL), or one 1 oz glass of hard liquor (44 mL). Lifestyle  Work with your doctor to stay at a healthy weight or to lose weight. Ask your doctor what the best weight is for you. Get at least 30 minutes of exercise that causes your heart to beat faster (aerobic exercise) most days of the week. This may include walking, swimming, or biking. Get at least 30 minutes of exercise that strengthens your muscles (resistance exercise) at least 3 days a week. This may include lifting weights or doing Pilates. Do not smoke or use any products that contain nicotine or tobacco. If you need help quitting, ask your doctor. Check your blood pressure at home as told by your doctor. Keep all follow-up visits. Medicines Take over-the-counter and prescription medicines  only as told by your doctor. Follow directions carefully. Do not skip doses of blood pressure medicine. The medicine does not work as well if you skip doses. Skipping doses also puts you at risk for problems. Ask your doctor about side effects or reactions to medicines that you should watch  for. Contact a doctor if: You think you are having a reaction to the medicine you are taking. You have headaches that keep coming back. You feel dizzy. You have swelling in your ankles. You have trouble with your vision. Get help right away if: You get a very bad headache. You start to feel mixed up (confused). You feel weak or numb. You feel faint. You have very bad pain in your: Chest. Belly (abdomen). You vomit more than once. You have trouble breathing. These symptoms may be an emergency. Get help right away. Call 911. Do not wait to see if the symptoms will go away. Do not drive yourself to the hospital. Summary Hypertension is another name for high blood pressure. High blood pressure forces your heart to work harder to pump blood. For most people, a normal blood pressure is less than 120/80. Making healthy choices can help lower blood pressure. If your blood pressure does not get lower with healthy choices, you may need to take medicine. This information is not intended to replace advice given to you by your health care provider. Make sure you discuss any questions you have with your health care provider. Document Revised: 05/08/2021 Document Reviewed: 05/08/2021 Elsevier Patient Education  2024 [employer].

## 2023-08-28 MED ORDER — LISINOPRIL 10 MG PO TABS: 10.0000 mg | ORAL_TABLET | Freq: Every day | ORAL | 1 refills | Status: DC

## 2023-08-28 NOTE — Assessment & Plan Note (Signed)
Chronic, followed by [facility].  Unfortunately, she has had post-operative complications. Most recent Ophthalmology notes reviewed.

## 2023-08-28 NOTE — Assessment & Plan Note (Signed)
Chronic, improved control with lisinopril 10mg  daily. She will continue with current meds. Encouraged to follow low sodium diet. She will rto in May 2025.

## 2023-09-09 DIAGNOSIS — H43822 Vitreomacular adhesion, left eye: Secondary | ICD-10-CM | POA: Diagnosis not present

## 2023-09-09 DIAGNOSIS — H33021 Retinal detachment with multiple breaks, right eye: Secondary | ICD-10-CM | POA: Diagnosis not present

## 2023-09-09 DIAGNOSIS — H2011 Chronic iridocyclitis, right eye: Secondary | ICD-10-CM | POA: Diagnosis not present

## 2023-09-09 DIAGNOSIS — H3521 Other non-diabetic proliferative retinopathy, right eye: Secondary | ICD-10-CM | POA: Diagnosis not present

## 2023-09-09 DIAGNOSIS — Z9889 Other specified postprocedural states: Secondary | ICD-10-CM | POA: Diagnosis not present

## 2023-09-09 DIAGNOSIS — H35412 Lattice degeneration of retina, left eye: Secondary | ICD-10-CM | POA: Diagnosis not present

## 2023-09-17 DIAGNOSIS — E049 Nontoxic goiter, unspecified: Secondary | ICD-10-CM | POA: Diagnosis not present

## 2023-10-14 ENCOUNTER — Ambulatory Visit (INDEPENDENT_AMBULATORY_CARE_PROVIDER_SITE_OTHER)

## 2023-10-14 ENCOUNTER — Encounter (HOSPITAL_COMMUNITY): Payer: Self-pay

## 2023-10-14 ENCOUNTER — Other Ambulatory Visit: Payer: Self-pay

## 2023-10-14 ENCOUNTER — Ambulatory Visit (HOSPITAL_COMMUNITY)
Admission: RE | Admit: 2023-10-14 | Discharge: 2023-10-14 | Disposition: A | Discharge status: Home / Self Care | Source: Ambulatory Visit | Attending: Family Medicine | Admitting: Family Medicine

## 2023-10-14 VITALS — BP 151/87 | HR 87 | Temp 99.3°F | Resp 20

## 2023-10-14 DIAGNOSIS — J069 Acute upper respiratory infection, unspecified: Secondary | ICD-10-CM | POA: Diagnosis not present

## 2023-10-14 DIAGNOSIS — R0602 Shortness of breath: Secondary | ICD-10-CM | POA: Diagnosis not present

## 2023-10-14 DIAGNOSIS — R059 Cough, unspecified: Secondary | ICD-10-CM | POA: Diagnosis not present

## 2023-10-14 DIAGNOSIS — R051 Acute cough: Secondary | ICD-10-CM

## 2023-10-14 LAB — POC COVID19/FLU A&B COMBO
Covid Antigen, POC: NEGATIVE
Influenza A Antigen, POC: NEGATIVE
Influenza B Antigen, POC: NEGATIVE

## 2023-10-14 MED ORDER — BENZONATATE 200 MG PO CAPS: 200.0000 mg | ORAL_CAPSULE | Freq: Three times a day (TID) | ORAL | 0 refills | Status: DC | PRN

## 2023-10-14 NOTE — ED Provider Notes (Signed)
 [facility]    CSN: [id number] Arrival date & time: 10/14/23  1535      History   Chief Complaint Chief Complaint  Patient presents with   Cough    Sore throat - Entered by patient   Appointment    4pm    HPI Susan Mason is a 63 y.o. female.    Cough Associated symptoms: rhinorrhea and shortness of breath    She started with a sore throat 3 days ago, also with body aches and feeling fatigued.  She is having a headache.  She is coughing, chest feels tight.  Mild sob.  Some sinus congestion.  No fevers.  This weekend she did have an exposure to flu/covid.  Her home covid test was negative.  She does not have asthma, but she did use an albuterol inhaler that she had at home.        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   Annual physical exam 04/12/2023   Uveitis 04/12/2023   Family history of chronic ischemic heart disease 04/12/2023   COVID-19 02/02/2023   Skin lesion 02/02/2023   Goiter 02/02/2023   Dysphagia 02/02/2023   Fatigue 08/21/2021   Hair loss 07/31/2021   Brittle nails 07/31/2021   Allergy desensitization therapy 12/26/2020   Other allergic rhinitis 09/17/2020   Essential hypertension 06/16/2018   Osteopenia 04/01/2012    Past Surgical History:  Procedure Laterality Date   COLPOSCOPY  08/03/2006   neg bx   EYE SURGERY     RETINAL DETACHMENT SURGERY Right 01/13/2023    OB History     Gravida  0   Para      Term      Preterm      AB      Living         SAB      IAB      Ectopic      Multiple      Live Births               Home Medications    Prior to Admission medications   Medication Sig Start Date End Date Taking? Authorizing Provider   brimonidine (ALPHAGAN) 0.2 % ophthalmic solution Place 1 drop into the right eye 3 (three) times daily.    [provider]  cetirizine (ZYRTEC) 10 MG tablet Take 10 mg by mouth daily.     [provider]  cholecalciferol (VITAMIN D) 1000 UNITS tablet Take 1,000 Units by mouth daily.    [provider]  lisinopril (ZESTRIL) 10 MG tablet Take 1 tablet (10 mg total) by mouth daily. 08/28/23 08/27/24  Dorothyann Peng, MD  methotrexate (RHEUMATREX) 2.5 MG tablet Take by mouth.    [provider]  Multiple Vitamins-Minerals (MULTIVITAMIN WITH MINERALS) tablet Take 1 tablet by mouth daily.    [provider]  prednisoLONE acetate (PRED FORTE) 1 % ophthalmic suspension Place 1 drop into the right eye 6 (six) times daily. 01/21/23   [provider]  Probiotic Product (PROBIOTIC-10 PO) Take 1 tablet by mouth daily.    [provider]  timolol (BETIMOL) 0.5 % ophthalmic solution 1 drop in  the morning, at noon, and at bedtime.    [provider]    Family History Family History  Problem Relation Age of Onset   Hyperlipidemia Mother    Hypertension Mother    Heart Problems Mother    Kidney disease Mother    Lung cancer Father    Emphysema Father    Breast cancer Neg Hx     Social History Social History   Tobacco Use   Smoking status: Never   Smokeless tobacco: Never  Vaping Use   Vaping status: Never Used  Substance Use Topics   Alcohol use: No   Drug use: No     Allergies   Clindamycin, Nsaids, Penicillins, Doxycycline, Doxycycline calcium, Erythromycin, and Erythromycin base   Review of Systems Review of Systems  Constitutional:  Positive for fatigue.  HENT:  Positive for congestion and rhinorrhea.   Respiratory:  Positive for cough and shortness of breath.   Cardiovascular: Negative.   Gastrointestinal: Negative.   Genitourinary: Negative.   Musculoskeletal: Negative.   Psychiatric/Behavioral: Negative.        Physical Exam Triage Vital Signs ED Triage Vitals  Encounter Vitals Group     BP 10/14/23 1626 (!) 151/87     Systolic BP Percentile --      Diastolic BP Percentile --      Pulse Rate 10/14/23 1626 87     Resp 10/14/23 1626 20     Temp 10/14/23 1626 99.3 F (37.4 C)     Temp Source 10/14/23 1626 Oral     SpO2 10/14/23 1626 97 %     Weight --      Height --      Head Circumference --      Peak Flow --      Pain Score 10/14/23 1621 7     Pain Loc --      Pain Education --      Exclude from Growth Chart --    No data found.  Updated Vital Signs BP (!) 151/87 (BP Location: Right Arm)   Pulse 87   Temp 99.3 F (37.4 C) (Oral)   Resp 20   SpO2 97%   Visual Acuity Right Eye Distance:   Left Eye Distance:   Bilateral Distance:    Right Eye Near:   Left Eye Near:    Bilateral Near:     Physical Exam Constitutional:      Appearance: Normal appearance. She is normal weight.  HENT:     Nose: Congestion present.     Mouth/Throat:     Mouth: Mucous membranes are moist.     Pharynx: Posterior oropharyngeal erythema present.  Cardiovascular:     Rate and Rhythm: Normal rate and regular rhythm.  Pulmonary:     Effort: Pulmonary effort is normal.     Breath sounds: Normal breath sounds. No wheezing or rhonchi.  Musculoskeletal:     Cervical back: Normal range of motion and neck supple. No tenderness.  Lymphadenopathy:     Cervical: No cervical adenopathy.  Skin:    General: Skin is warm.  Neurological:     General: No focal deficit present.     Mental Status: She is alert.  Psychiatric:        Mood and Affect: Mood normal.      UC Treatments / Results  Labs (all labs ordered are listed, but only abnormal results are displayed) Labs Reviewed  POC COVID19/FLU A&B COMBO    EKG   Radiology  No results found.  Procedures Procedures (including critical care time)  Medications Ordered in UC Medications - No data to display  Initial Impression /  Assessment and Plan / UC Course  I have reviewed the triage vital signs and the nursing notes.  Pertinent labs & imaging results that were available during my care of the patient were reviewed by me and considered in my medical decision making (see chart for details).     Final diagnoses:  Acute cough  Viral URI with cough     Discharge Instructions      You were seen today for upper respiratory symptoms.  Your chest xray appears normal.  If the radiologist reads this differently we will call to notify you.  Your flu and covid swab was negative as well.  This appears to be due to another virus.  I have sent out a medication to help with cough.  Please return if you are not improving or worsening.     ED Prescriptions     Medication Sig Dispense Auth. Provider   benzonatate (TESSALON) 200 MG capsule Take 1 capsule (200 mg total) by mouth 3 (three) times daily as needed for cough. 21 capsule Jannifer Franklin, MD      PDMP not reviewed this encounter.   Jannifer Franklin, MD 10/14/23 1705

## 2023-10-14 NOTE — ED Triage Notes (Addendum)
 Tuesday started having headache, cough, chest heavy.  Patient has phlegm, but cannot get phlegm up, even with coughing spells.  "I just can't stop coughing"    Has taken delsym, tylenol, albuterol inhaler

## 2023-10-14 NOTE — ED Triage Notes (Signed)
 Patient was in [place] last week, returning to [place] on Monday, symptoms started the next day.  Patient reports a few family members in [place] have covid.    This morning tested for covid and it was negative

## 2023-10-14 NOTE — Discharge Instructions (Addendum)
 You were seen today for upper respiratory symptoms.  Your chest xray appears normal.  If the radiologist reads this differently we will call to notify you.  Your flu and covid swab was negative as well.  This appears to be due to another virus.  I have sent out a medication to help with cough.  Please return if you are not improving or worsening.

## 2023-10-17 ENCOUNTER — Encounter (HOSPITAL_COMMUNITY): Payer: Self-pay

## 2023-10-17 ENCOUNTER — Ambulatory Visit (HOSPITAL_COMMUNITY)
Admission: RE | Admit: 2023-10-17 | Discharge: 2023-10-17 | Disposition: A | Discharge status: Home / Self Care | Payer: Self-pay | Source: Ambulatory Visit

## 2023-10-17 VITALS — BP 152/90 | HR 89 | Temp 99.2°F | Resp 18 | Ht 61.0 in | Wt 95.9 lb

## 2023-10-17 DIAGNOSIS — H6692 Otitis media, unspecified, left ear: Secondary | ICD-10-CM

## 2023-10-17 DIAGNOSIS — J209 Acute bronchitis, unspecified: Secondary | ICD-10-CM | POA: Diagnosis not present

## 2023-10-17 MED ORDER — ALBUTEROL SULFATE HFA 108 (90 BASE) MCG/ACT IN AERS: 2.0000 | INHALATION_SPRAY | Freq: Four times a day (QID) | RESPIRATORY_TRACT | 0 refills | Status: AC | PRN

## 2023-10-17 MED ORDER — DEXAMETHASONE SODIUM PHOSPHATE 10 MG/ML IJ SOLN
10.0000 mg | Freq: Once | INTRAMUSCULAR | Status: AC
  Administered 2023-10-17: 10 mg via INTRAMUSCULAR

## 2023-10-17 MED ORDER — DEXAMETHASONE SODIUM PHOSPHATE 10 MG/ML IJ SOLN
INTRAMUSCULAR | Status: AC
  Filled 2023-10-17: qty 1

## 2023-10-17 MED ORDER — LEVOFLOXACIN 500 MG PO TABS: 500.0000 mg | ORAL_TABLET | Freq: Every day | ORAL | 0 refills | Status: DC

## 2023-10-17 NOTE — ED Provider Notes (Signed)
 [facility]    CSN: [id number] Arrival date & time: 10/17/23  1040      History   Chief Complaint Chief Complaint  Patient presents with   Appointment   Cough    HPI Susan Mason is a 63 y.o. female.   Patient presents to urgent care for evaluation of cough, nasal congestion, sore throat, left ear pain, and generalized fatigue that started 5 days ago.  She was seen 5 days ago urgent care where she tested negative for COVID, flu, and was prescribed Tessalon Perles.  Tessalon Perles have not helped with cough, states Delsym is helping.  Cough is somewhat productive.  Reports intermittent shortness of breath and bilateral chest tightness associated with coughing.  Left ear started hurting a few days ago.  She has not noted any drainage, tinnitus, or decreased hearing from the left ear.  Describes left ear pain as sharp and intermittent.  No recent fevers, chills, nausea, vomiting, diarrhea, abdominal pain, rash, or recent sick contacts with similar symptoms.  She was a smoker over 40 years ago, denies current drug/cigarette/tobacco use.  No history of chronic respiratory problems.  History of bronchitis a few years ago, states this feels similar.  No recent antibiotic or steroid use in the last 90 days.  History of osteopenia.  Taking over-the-counter medications with minimal relief.  Multiple allergies to antibiotics.   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   Annual physical exam 04/12/2023   Uveitis 04/12/2023   Family history of chronic ischemic heart disease 04/12/2023   COVID-19 02/02/2023   Skin lesion 02/02/2023   Goiter 02/02/2023   Dysphagia 02/02/2023   Fatigue 08/21/2021   Hair loss 07/31/2021    Brittle nails 07/31/2021   Allergy desensitization therapy 12/26/2020   Other allergic rhinitis 09/17/2020   Essential hypertension 06/16/2018   Osteopenia 04/01/2012    Past Surgical History:  Procedure Laterality Date   COLPOSCOPY  08/03/2006   neg bx   EYE SURGERY     RETINAL DETACHMENT SURGERY Right 01/13/2023    OB History     Gravida  0   Para      Term      Preterm      AB      Living         SAB      IAB      Ectopic      Multiple      Live Births               Home Medications    Prior to Admission medications   Medication Sig Start Date End Date Taking? Authorizing Provider  albuterol (VENTOLIN HFA) 108 (90 Base) MCG/ACT inhaler Inhale 2 puffs into the lungs every 6 (six) hours as needed for wheezing or shortness of breath. 10/17/23  Yes Carlisle Beers, FNP  benzonatate (TESSALON) 200 MG capsule Take 1 capsule (200 mg total) by mouth 3 (three) times daily as needed for cough. 10/14/23  Yes Piontek, Denny Peon, MD  CALCIUM PO Take by mouth.   Yes [provider]  cetirizine (ZYRTEC) 10 MG tablet Take 10 mg by mouth  daily.    Yes [provider]  cholecalciferol (VITAMIN D) 1000 UNITS tablet Take 1,000 Units by mouth daily.   Yes [provider]  levofloxacin (LEVAQUIN) 500 MG tablet Take 1 tablet (500 mg total) by mouth daily. 10/17/23  Yes Carlisle Beers, FNP  lisinopril (ZESTRIL) 10 MG tablet Take 1 tablet (10 mg total) by mouth daily. 08/28/23 08/27/24 Yes Dorothyann Peng, MD  Multiple Vitamins-Minerals (MULTIVITAMIN WITH MINERALS) tablet Take 1 tablet by mouth daily.   Yes [provider]  Polyethylene Glycol 3350 (MIRALAX PO) Take by mouth.   Yes [provider]  Probiotic Product (PROBIOTIC-10 PO) Take 1 tablet by mouth daily.   Yes [provider]    Family History Family History  Problem Relation Age of Onset   Hyperlipidemia Mother    Hypertension Mother    Heart Problems  Mother    Kidney disease Mother    Lung cancer Father    Emphysema Father    Breast cancer Neg Hx     Social History Social History   Tobacco Use   Smoking status: Never   Smokeless tobacco: Never  Vaping Use   Vaping status: Never Used  Substance Use Topics   Alcohol use: No   Drug use: No     Allergies   Clindamycin, Nsaids, Penicillins, Doxycycline, Doxycycline calcium, Erythromycin, and Erythromycin base   Review of Systems Review of Systems  Respiratory:  Positive for cough.   Per HPI   Physical Exam Triage Vital Signs ED Triage Vitals  Encounter Vitals Group     BP 10/17/23 1109 (!) 152/90     Systolic BP Percentile --      Diastolic BP Percentile --      Pulse Rate 10/17/23 1109 89     Resp 10/17/23 1109 18     Temp 10/17/23 1109 99.2 F (37.3 C)     Temp Source 10/17/23 1109 Oral     SpO2 10/17/23 1109 98 %     Weight 10/17/23 1108 95 lb 14.4 oz (43.5 kg)     Height 10/17/23 1108 5\' 1"  (1.549 m)     Head Circumference --      Peak Flow --      Pain Score 10/17/23 1107 10     Pain Loc --      Pain Education --      Exclude from Growth Chart --    No data found.  Updated Vital Signs BP (!) 152/90 (BP Location: Left Arm)   Pulse 89   Temp 99.2 F (37.3 C) (Oral)   Resp 18   Ht 5\' 1"  (1.549 m)   Wt 95 lb 14.4 oz (43.5 kg)   SpO2 98%   BMI 18.12 kg/m   Visual Acuity Right Eye Distance:   Left Eye Distance:   Bilateral Distance:    Right Eye Near:   Left Eye Near:    Bilateral Near:     Physical Exam Vitals and nursing note reviewed.  Constitutional:      Appearance: She is ill-appearing. She is not toxic-appearing.  HENT:     Head: Normocephalic and atraumatic.     Jaw: There is normal jaw occlusion.     Right Ear: Hearing, tympanic membrane, ear canal and external ear normal.     Left Ear: Hearing, ear canal and external ear normal. Tympanic membrane is erythematous and bulging.     Nose: Congestion present.     Mouth/Throat:  Lips: Pink.     Mouth: Mucous membranes are moist. No injury or oral lesions.     Dentition: Normal dentition.     Tongue: No lesions.     Pharynx: Oropharynx is clear. Uvula midline. No pharyngeal swelling, oropharyngeal exudate, posterior oropharyngeal erythema, uvula swelling or postnasal drip.     Tonsils: No tonsillar exudate.  Eyes:     General: Lids are normal. Vision grossly intact. Gaze aligned appropriately.     Extraocular Movements: Extraocular movements intact.     Conjunctiva/sclera: Conjunctivae normal.  Neck:     Trachea: Trachea and phonation normal.  Cardiovascular:     Rate and Rhythm: Normal rate and regular rhythm.     Heart sounds: Normal heart sounds, S1 normal and S2 normal.  Pulmonary:     Effort: Pulmonary effort is normal. No respiratory distress.     Breath sounds: Normal breath sounds and air entry. No wheezing, rhonchi or rales.     Comments: Coarse breath sounds throughout.  No focal lung finding.  Speaking in full sentences without difficulty or increased respiratory effort. Chest:     Chest wall: No tenderness.  Musculoskeletal:     Cervical back: Neck supple.     Right lower leg: No edema.     Left lower leg: No edema.  Lymphadenopathy:     Cervical: No cervical adenopathy.  Skin:    General: Skin is warm and dry.     Capillary Refill: Capillary refill takes less than 2 seconds.     Findings: No rash.  Neurological:     General: No focal deficit present.     Mental Status: She is alert and oriented to person, place, and time. Mental status is at baseline.     Cranial Nerves: No dysarthria or facial asymmetry.  Psychiatric:        Mood and Affect: Mood normal.        Speech: Speech normal.        Behavior: Behavior normal.        Thought Content: Thought content normal.        Judgment: Judgment normal.      UC Treatments / Results  Labs (all labs ordered are listed, but only abnormal results are displayed) Labs Reviewed - No data  to display  EKG   Radiology No results found.  Procedures Procedures (including critical care time)  Medications Ordered in UC Medications  dexamethasone (DECADRON) injection 10 mg (10 mg Intramuscular Given 10/17/23 1216)    Initial Impression / Assessment and Plan / UC Course  I have reviewed the triage vital signs and the nursing notes.  Pertinent labs & imaging results that were available during my care of the patient were reviewed by me and considered in my medical decision making (see chart for details).   1.  Acute bronchitis, left otitis media Presentation suspicious for acute bronchitis etiology. Reviewed previous visit findings with negative COVID and flu testing.  Deferred repeat testing given timing of illness. Will treat with bronchodilator, dexamethasone 10 mg IM, Delsym cough suppressant, and Mucinex as needed. Deferred oral prednisone given history of osteopenia.  Nonfocal lung exam, no new oxygen requirement, therefore deferred imaging of the chest.   Allergy to penicillin, clindamycin, and doxycycline.  States penicillin allergic reaction happened when she was very young and she does not know type of reaction. She is unsure if she has had a cephalosporin in the past. Given she does not know type of reaction to penicillin,  will defer use of cephalosporin today.  Levofloxacin ordered to treat left ear infection and sinusitis.  Advised to take once daily for the next 7 days. Discussed use of daily probiotic and hydration to prevent diarrheal illness associate with antibiotic.  PCP follow-up encouraged in the next 3 to 5 days should symptoms fail to improve  Counseled patient on potential for adverse effects with medications prescribed/recommended today, strict ER and return-to-clinic precautions discussed, patient verbalized understanding.    Final Clinical Impressions(s) / UC Diagnoses   Final diagnoses:  Left otitis media, unspecified otitis media type   Acute bronchitis, unspecified organism     Discharge Instructions      Take antibiotic once daily for 7 days. We gave you a steroid injection today to reduce inflammation to the lungs and nasal inflammation. Continue delsym as needed. Albuterol 2 puffs every 4-6 hours as needed for cough and wheezing.   If you develop any new or worsening symptoms or if your symptoms do not start to improve, please return here or follow-up with your primary care provider. If your symptoms are severe, please go to the emergency room.   ED Prescriptions     Medication Sig Dispense Auth. Provider   albuterol (VENTOLIN HFA) 108 (90 Base) MCG/ACT inhaler Inhale 2 puffs into the lungs every 6 (six) hours as needed for wheezing or shortness of breath. 18 g Reita May M, FNP   levofloxacin (LEVAQUIN) 500 MG tablet Take 1 tablet (500 mg total) by mouth daily. 7 tablet Carlisle Beers, FNP      PDMP not reviewed this encounter.   Carlisle Beers, [place] 10/17/23 1324

## 2023-10-17 NOTE — ED Triage Notes (Signed)
 Chief Complaint: "Sore throat , cough, congested,headache, body aches." - Entered by patient. Patient was seen this past Thursday but feeling worse. States now having a lot of pressure and heaviness in the sinuses and chest.   Sick exposure: Yes: some family members had COVID.   Onset: this past Tuesday   Prescriptions or OTC medications tried: Yes- Tessalon, tylenol    with no relief  New foods, medications, or products: No  Recent Travel: Yes- [place] last Thursday and got back on Monday.

## 2023-10-17 NOTE — Discharge Instructions (Addendum)
 Take antibiotic once daily for 7 days. We gave you a steroid injection today to reduce inflammation to the lungs and nasal inflammation. Continue delsym as needed. Albuterol 2 puffs every 4-6 hours as needed for cough and wheezing.   If you develop any new or worsening symptoms or if your symptoms do not start to improve, please return here or follow-up with your primary care provider. If your symptoms are severe, please go to the emergency room.

## 2023-10-20 ENCOUNTER — Encounter: Payer: Self-pay | Admitting: Family Medicine

## 2023-10-20 ENCOUNTER — Ambulatory Visit: Payer: Self-pay | Admitting: Family Medicine

## 2023-10-20 VITALS — BP 110/80 | HR 77 | Temp 97.9°F | Wt 97.0 lb

## 2023-10-20 DIAGNOSIS — H6692 Otitis media, unspecified, left ear: Secondary | ICD-10-CM | POA: Diagnosis not present

## 2023-10-20 DIAGNOSIS — J209 Acute bronchitis, unspecified: Secondary | ICD-10-CM | POA: Diagnosis not present

## 2023-10-20 DIAGNOSIS — R051 Acute cough: Secondary | ICD-10-CM

## 2023-10-20 DIAGNOSIS — J219 Acute bronchiolitis, unspecified: Secondary | ICD-10-CM

## 2023-10-20 MED ORDER — GUAIFENESIN ER 600 MG PO TB12
600.0000 mg | ORAL_TABLET | Freq: Two times a day (BID) | ORAL | 0 refills | Status: AC
Start: 2023-10-20 — End: 2023-10-27

## 2023-10-20 NOTE — Progress Notes (Signed)
 I,[name], CMA,acting as a Neurosurgeon for [employer], NP.,have documented all relevant documentation on the behalf of Ellender Hose, NP,as directed by  Ellender Hose, NP while in the presence of Ellender Hose, NP.  Subjective:  Patient ID: Susan Mason , female    DOB: Dec 23, 1960 , 63 y.o.   MRN: [id number]  Chief Complaint  Patient presents with   Follow-up    HPI  Patient is a 63 year old female who presents today for urgent care follow up.  She was in the urgent care on 10/17/2023  for cough, sore throat,  congestion, ear pain, chest tightness and shortness of breath.  She was diagnosed with otitis media and she was prescribed levofloxacin once daily for 7 days and also given dexamethasone injection right there at that urgent care .  Patient reports she is still coughing and her voice is still hoarse. Advised patient to finish her medication as prescribed, this was just the 3rd day and also to use mucinex to help the cough.She voiced understanding.      Past Medical History:  Diagnosis Date   Abnormal Pap smear 08/03/2006   ASCUS   Granuloma annulare    High blood pressure    Metrorrhagia    Osteopenia    Ovarian cyst    Yeast vaginitis      Family History  Problem Relation Age of Onset   Hyperlipidemia Mother    Hypertension Mother    Heart Problems Mother    Kidney disease Mother    Lung cancer Father    Emphysema Father    Breast cancer Neg Hx      Current Outpatient Medications:    albuterol (VENTOLIN HFA) 108 (90 Base) MCG/ACT inhaler, Inhale 2 puffs into the lungs every 6 (six) hours as needed for wheezing or shortness of breath., Disp: 18 g, Rfl: 0   CALCIUM PO, Take by mouth., Disp: , Rfl:    cetirizine (ZYRTEC) 10 MG tablet, Take 10 mg by mouth daily. , Disp: , Rfl:    cholecalciferol (VITAMIN D) 1000 UNITS tablet, Take 1,000 Units by mouth daily., Disp: , Rfl:    levofloxacin (LEVAQUIN) 500 MG tablet, Take 1 tablet (500 mg total) by mouth daily., Disp: 7  tablet, Rfl: 0   lisinopril (ZESTRIL) 10 MG tablet, Take 1 tablet (10 mg total) by mouth daily., Disp: 90 tablet, Rfl: 1   Multiple Vitamins-Minerals (MULTIVITAMIN WITH MINERALS) tablet, Take 1 tablet by mouth daily., Disp: , Rfl:    Polyethylene Glycol 3350 (MIRALAX PO), Take by mouth., Disp: , Rfl:    Probiotic Product (PROBIOTIC-10 PO), Take 1 tablet by mouth daily., Disp: , Rfl:    benzonatate (TESSALON) 200 MG capsule, Take 1 capsule (200 mg total) by mouth 3 (three) times daily as needed for cough. (Patient not taking: Reported on 10/20/2023), Disp: 21 capsule, Rfl: 0   Allergies  Allergen Reactions   Clindamycin Rash   Nsaids Other (See Comments)   Penicillins    Doxycycline    Doxycycline Calcium Hives   Erythromycin    Erythromycin Base      Review of Systems  Constitutional: Negative.   HENT: Negative.    Eyes:  Positive for redness.  Respiratory:  Positive for cough.   Psychiatric/Behavioral: Negative.       Today's Vitals   10/20/23 1148  BP: 110/80  Pulse: 77  Temp: 97.9 F (36.6 C)  TempSrc: Oral  Weight: 97 lb (44 kg)   Body mass index  is 18.33 kg/m.  Wt Readings from Last 3 Encounters:  10/20/23 97 lb (44 kg)  10/17/23 95 lb 14.4 oz (43.5 kg)  08/17/23 96 lb (43.5 kg)    The 10-year ASCVD risk score (Arnett DK, et al., 2019) is: 4.2%   Values used to calculate the score:     Age: 4 years     Sex: Female     Is Non-Hispanic African American: No     Diabetic: No     Tobacco smoker: No     Systolic Blood Pressure: 110 mmHg     Is BP treated: Yes     HDL Cholesterol: 64 mg/dL     Total Cholesterol: 205 mg/dL  Objective:  Physical Exam HENT:     Head: Normocephalic.     Left Ear: Tenderness present.  Cardiovascular:     Rate and Rhythm: Normal rate.  Pulmonary:     Effort: Pulmonary effort is normal.  Neurological:     Mental Status: She is alert.         Assessment And Plan:  Acute bronchitis, unspecified organism -     guaiFENesin  ER; Take 1 tablet (600 mg total) by mouth 2 (two) times daily for 7 days.  Dispense: 14 tablet; Refill: 0  Left otitis media, unspecified otitis media type Assessment & Plan: Take Levofloxacin one tablet daily for 7 days as prescribed      Return if symptoms worsen or fail to improve.  Patient was given opportunity to ask questions. Patient verbalized understanding of the plan and was able to repeat key elements of the plan. All questions were answered to their satisfaction.    I, Ellender Hose, NP, have reviewed all documentation for this visit. The documentation on 10/29/23 for the exam, diagnosis, procedures, and orders are all accurate and complete.   IF YOU HAVE BEEN REFERRED TO A SPECIALIST, IT MAY TAKE 1-2 WEEKS TO SCHEDULE/PROCESS THE REFERRAL. IF YOU HAVE NOT HEARD FROM US/SPECIALIST IN TWO WEEKS, PLEASE GIVE [place] A CALL AT [phone].

## 2023-10-27 DIAGNOSIS — M7701 Medial epicondylitis, right elbow: Secondary | ICD-10-CM | POA: Diagnosis not present

## 2023-10-27 DIAGNOSIS — M67911 Unspecified disorder of synovium and tendon, right shoulder: Secondary | ICD-10-CM | POA: Diagnosis not present

## 2023-10-29 DIAGNOSIS — J209 Acute bronchitis, unspecified: Secondary | ICD-10-CM | POA: Insufficient documentation

## 2023-10-29 DIAGNOSIS — R051 Acute cough: Secondary | ICD-10-CM | POA: Insufficient documentation

## 2023-10-29 DIAGNOSIS — H6692 Otitis media, unspecified, left ear: Secondary | ICD-10-CM | POA: Insufficient documentation

## 2023-10-29 NOTE — Assessment & Plan Note (Signed)
Use albuterol as needed for shortness of breath 

## 2023-10-29 NOTE — Assessment & Plan Note (Signed)
 Take Levofloxacin one tablet daily for 7 days as prescribed

## 2023-11-03 DIAGNOSIS — L814 Other melanin hyperpigmentation: Secondary | ICD-10-CM | POA: Diagnosis not present

## 2023-11-03 DIAGNOSIS — D225 Melanocytic nevi of trunk: Secondary | ICD-10-CM | POA: Diagnosis not present

## 2023-11-03 DIAGNOSIS — L931 Subacute cutaneous lupus erythematosus: Secondary | ICD-10-CM | POA: Diagnosis not present

## 2023-11-03 DIAGNOSIS — L821 Other seborrheic keratosis: Secondary | ICD-10-CM | POA: Diagnosis not present

## 2023-11-08 DIAGNOSIS — H6992 Unspecified Eustachian tube disorder, left ear: Secondary | ICD-10-CM | POA: Diagnosis not present

## 2023-11-08 DIAGNOSIS — H938X2 Other specified disorders of left ear: Secondary | ICD-10-CM | POA: Diagnosis not present

## 2023-11-09 DIAGNOSIS — Z961 Presence of intraocular lens: Secondary | ICD-10-CM | POA: Diagnosis not present

## 2023-11-09 DIAGNOSIS — H33021 Retinal detachment with multiple breaks, right eye: Secondary | ICD-10-CM | POA: Diagnosis not present

## 2023-11-09 DIAGNOSIS — H21541 Posterior synechiae (iris), right eye: Secondary | ICD-10-CM | POA: Diagnosis not present

## 2023-11-09 DIAGNOSIS — H4020X0 Unspecified primary angle-closure glaucoma, stage unspecified: Secondary | ICD-10-CM | POA: Diagnosis not present

## 2023-11-09 DIAGNOSIS — Z79899 Other long term (current) drug therapy: Secondary | ICD-10-CM | POA: Diagnosis not present

## 2023-11-09 DIAGNOSIS — H3341 Traction detachment of retina, right eye: Secondary | ICD-10-CM | POA: Diagnosis not present

## 2023-11-09 DIAGNOSIS — H35412 Lattice degeneration of retina, left eye: Secondary | ICD-10-CM | POA: Diagnosis not present

## 2023-11-09 DIAGNOSIS — H30031 Focal chorioretinal inflammation, peripheral, right eye: Secondary | ICD-10-CM | POA: Diagnosis not present

## 2023-11-10 DIAGNOSIS — Z681 Body mass index (BMI) 19 or less, adult: Secondary | ICD-10-CM | POA: Diagnosis not present

## 2023-11-10 DIAGNOSIS — Z01419 Encounter for gynecological examination (general) (routine) without abnormal findings: Secondary | ICD-10-CM | POA: Diagnosis not present

## 2023-12-16 ENCOUNTER — Encounter: Payer: Self-pay | Admitting: Internal Medicine

## 2023-12-16 ENCOUNTER — Ambulatory Visit: Payer: 59 | Admitting: Internal Medicine

## 2023-12-16 VITALS — BP 130/82 | HR 62 | Temp 97.8°F | Ht 61.0 in | Wt 98.8 lb

## 2023-12-16 DIAGNOSIS — R140 Abdominal distension (gaseous): Secondary | ICD-10-CM | POA: Diagnosis not present

## 2023-12-16 DIAGNOSIS — H3321 Serous retinal detachment, right eye: Secondary | ICD-10-CM | POA: Diagnosis not present

## 2023-12-16 DIAGNOSIS — K5904 Chronic idiopathic constipation: Secondary | ICD-10-CM | POA: Diagnosis not present

## 2023-12-16 DIAGNOSIS — E049 Nontoxic goiter, unspecified: Secondary | ICD-10-CM | POA: Diagnosis not present

## 2023-12-16 DIAGNOSIS — S86899A Other injury of other muscle(s) and tendon(s) at lower leg level, unspecified leg, initial encounter: Secondary | ICD-10-CM | POA: Diagnosis not present

## 2023-12-16 DIAGNOSIS — I10 Essential (primary) hypertension: Secondary | ICD-10-CM | POA: Diagnosis not present

## 2023-12-16 DIAGNOSIS — K219 Gastro-esophageal reflux disease without esophagitis: Secondary | ICD-10-CM | POA: Diagnosis not present

## 2023-12-16 NOTE — Assessment & Plan Note (Signed)
 Improvement with current management. Stretching before and after exercise has alleviated symptoms. - Use Voltaren gel for symptomatic relief. - Continue stretching before and after exercise. - Continue using ice and heat therapy as needed.

## 2023-12-16 NOTE — Patient Instructions (Signed)
 Hypertension, Adult Hypertension is another name for high blood pressure. High blood pressure forces your heart to work harder to pump blood. This can cause problems over time. There are two numbers in a blood pressure reading. There is a top number (systolic) over a bottom number (diastolic). It is best to have a blood pressure that is below 120/80. What are the causes? The cause of this condition is not known. Some other conditions can lead to high blood pressure. What increases the risk? Some lifestyle factors can make you more likely to develop high blood pressure: Smoking. Not getting enough exercise or physical activity. Being overweight. Having too much fat, sugar, calories, or salt (sodium) in your diet. Drinking too much alcohol. Other risk factors include: Having any of these conditions: Heart disease. Diabetes. High cholesterol. Kidney disease. Obstructive sleep apnea. Having a family history of high blood pressure and high cholesterol. Age. The risk increases with age. Stress. What are the signs or symptoms? High blood pressure may not cause symptoms. Very high blood pressure (hypertensive crisis) may cause: Headache. Fast or uneven heartbeats (palpitations). Shortness of breath. Nosebleed. Vomiting or feeling like you may vomit (nauseous). Changes in how you see. Very bad chest pain. Feeling dizzy. Seizures. How is this treated? This condition is treated by making healthy lifestyle changes, such as: Eating healthy foods. Exercising more. Drinking less alcohol. Your doctor may prescribe medicine if lifestyle changes do not help enough and if: Your top number is above 130. Your bottom number is above 80. Your personal target blood pressure may vary. Follow these instructions at home: Eating and drinking  If told, follow the DASH eating plan. To follow this plan: Fill one half of your plate at each meal with fruits and vegetables. Fill one fourth of your plate  at each meal with whole grains. Whole grains include whole-wheat pasta, brown rice, and whole-grain bread. Eat or drink low-fat dairy products, such as skim milk or low-fat yogurt. Fill one fourth of your plate at each meal with low-fat (lean) proteins. Low-fat proteins include fish, chicken without skin, eggs, beans, and tofu. Avoid fatty meat, cured and processed meat, or chicken with skin. Avoid pre-made or processed food. Limit the amount of salt in your diet to less than 1,500 mg each day. Do not drink alcohol if: Your doctor tells you not to drink. You are pregnant, may be pregnant, or are planning to become pregnant. If you drink alcohol: Limit how much you have to: 0-1 drink a day for women. 0-2 drinks a day for men. Know how much alcohol is in your drink. In the [place] one drink equals one 12 oz bottle of beer (355 mL), one 5 oz glass of wine (148 mL), or one 1 oz glass of hard liquor (44 mL). Lifestyle  Work with your doctor to stay at a healthy weight or to lose weight. Ask your doctor what the best weight is for you. Get at least 30 minutes of exercise that causes your heart to beat faster (aerobic exercise) most days of the week. This may include walking, swimming, or biking. Get at least 30 minutes of exercise that strengthens your muscles (resistance exercise) at least 3 days a week. This may include lifting weights or doing Pilates. Do not smoke or use any products that contain nicotine or tobacco. If you need help quitting, ask your doctor. Check your blood pressure at home as told by your doctor. Keep all follow-up visits. Medicines Take over-the-counter and prescription medicines  only as told by your doctor. Follow directions carefully. Do not skip doses of blood pressure medicine. The medicine does not work as well if you skip doses. Skipping doses also puts you at risk for problems. Ask your doctor about side effects or reactions to medicines that you should watch  for. Contact a doctor if: You think you are having a reaction to the medicine you are taking. You have headaches that keep coming back. You feel dizzy. You have swelling in your ankles. You have trouble with your vision. Get help right away if: You get a very bad headache. You start to feel mixed up (confused). You feel weak or numb. You feel faint. You have very bad pain in your: Chest. Belly (abdomen). You vomit more than once. You have trouble breathing. These symptoms may be an emergency. Get help right away. Call 911. Do not wait to see if the symptoms will go away. Do not drive yourself to the hospital. Summary Hypertension is another name for high blood pressure. High blood pressure forces your heart to work harder to pump blood. For most people, a normal blood pressure is less than 120/80. Making healthy choices can help lower blood pressure. If your blood pressure does not get lower with healthy choices, you may need to take medicine. This information is not intended to replace advice given to you by your health care provider. Make sure you discuss any questions you have with your health care provider. Document Revised: 05/08/2021 Document Reviewed: 05/08/2021 Elsevier Patient Education  2024 [employer].

## 2023-12-16 NOTE — Progress Notes (Signed)
 I,Victoria T Basil Lim, CMA,acting as a Neurosurgeon for Smiley Dung, MD.,have documented all relevant documentation on the behalf of Smiley Dung, MD,as directed by  Smiley Dung, MD while in the presence of Smiley Dung, MD.  Subjective:  Patient ID: Susan Mason , female    DOB: August 14, 1960 , 63 y.o.   MRN: [id number]  Chief Complaint  Patient presents with   Hypertension    Pt presents today for BP check. Pt doesn't have any specific questions or concerns. Pt doesn't complain of any headaches, chest pain or sob.    HPI Discussed the use of AI scribe software for clinical note transcription with the patient, who gave verbal consent to proceed.  History of Present Illness Susan Mason is a 63 year old female who presents for a blood pressure check.  She experiences shin splints that were previously severe enough to disrupt her sleep. Stretching before and after walking has improved her symptoms, and she alternates between ice and heat for relief. She maintains an active lifestyle, playing pickleball three times a week, walking twice a week, and doing light arm weights daily.  She has a history of retinal detachment in her right eye, resulting in loss of vision. Multiple surgeries were performed, including the placement of an oil bubble to prevent scar tissue formation. Despite these interventions, scar tissue continued to cause retinal detachment. She has consulted with specialists at [facility] for second opinions. Currently, she perceives light and dark shadows but expects this to worsen over time. She takes Curcumin supplements, although she has not noticed any improvement. Her left eye is monitored due to previous retinal thinning, which was treated successfully.  She drinks decaf tea with cinnamon and eats oatmeal with cinnamon for its anti-inflammatory properties. She avoids driving at night due to her vision issues. No new concerns aside from shin splints.   Hypertension This is a  chronic problem. The current episode started more than 1 year ago. The problem is unchanged. The problem is controlled. Pertinent negatives include no anxiety or malaise/fatigue. Past treatments include ACE inhibitors. There are no compliance problems.  There is no history of CAD/MI.     Past Medical History:  Diagnosis Date   Abnormal Pap smear 08/03/2006   ASCUS   Granuloma annulare    High blood pressure    Metrorrhagia    Osteopenia    Ovarian cyst    Yeast vaginitis      Family History  Problem Relation Age of Onset   Hyperlipidemia Mother    Hypertension Mother    Heart Problems Mother    Kidney disease Mother    Lung cancer Father    Emphysema Father    Breast cancer Neg Hx      Current Outpatient Medications:    albuterol  (VENTOLIN  HFA) 108 (90 Base) MCG/ACT inhaler, Inhale 2 puffs into the lungs every 6 (six) hours as needed for wheezing or shortness of breath., Disp: 18 g, Rfl: 0   CALCIUM PO, Take by mouth., Disp: , Rfl:    cetirizine (ZYRTEC) 10 MG tablet, Take 10 mg by mouth daily. , Disp: , Rfl:    cholecalciferol (VITAMIN D ) 1000 UNITS tablet, Take 1,000 Units by mouth daily., Disp: , Rfl:    lisinopril  (ZESTRIL ) 10 MG tablet, Take 1 tablet (10 mg total) by mouth daily., Disp: 90 tablet, Rfl: 1   Multiple Vitamins-Minerals (MULTIVITAMIN WITH MINERALS) tablet, Take 1 tablet by mouth daily., Disp: , Rfl:    Polyethylene  Glycol 3350 (MIRALAX PO), Take by mouth., Disp: , Rfl:    Probiotic Product (PROBIOTIC-10 PO), Take 1 tablet by mouth daily., Disp: , Rfl:    benzonatate  (TESSALON ) 200 MG capsule, Take 1 capsule (200 mg total) by mouth 3 (three) times daily as needed for cough. (Patient not taking: Reported on 12/16/2023), Disp: 21 capsule, Rfl: 0   levofloxacin  (LEVAQUIN ) 500 MG tablet, Take 1 tablet (500 mg total) by mouth daily. (Patient not taking: Reported on 12/16/2023), Disp: 7 tablet, Rfl: 0   Allergies  Allergen Reactions   Clindamycin Rash   Nsaids Other  (See Comments)   Penicillins    Doxycycline    Doxycycline Calcium Hives   Erythromycin    Erythromycin Base      Review of Systems  Constitutional: Negative.  Negative for malaise/fatigue.  Respiratory: Negative.    Cardiovascular: Negative.   Neurological: Negative.   Psychiatric/Behavioral: Negative.       Today's Vitals   12/16/23 0919  BP: 130/82  Pulse: 62  Temp: 97.8 F (36.6 C)  SpO2: 98%  Weight: 98 lb 12.8 oz (44.8 kg)  Height: 5\' 1"  (1.549 m)   Body mass index is 18.67 kg/m.  Wt Readings from Last 3 Encounters:  12/16/23 98 lb 12.8 oz (44.8 kg)  10/20/23 97 lb (44 kg)  10/17/23 95 lb 14.4 oz (43.5 kg)     Objective:  Physical Exam Vitals and nursing note reviewed.  Constitutional:      Appearance: Normal appearance.  HENT:     Head: Normocephalic and atraumatic.  Eyes:     Extraocular Movements: Extraocular movements intact.  Cardiovascular:     Rate and Rhythm: Normal rate and regular rhythm.     Heart sounds: Normal heart sounds.  Pulmonary:     Effort: Pulmonary effort is normal.     Breath sounds: Normal breath sounds.  Musculoskeletal:     Cervical back: Normal range of motion.  Skin:    General: Skin is warm.  Neurological:     General: No focal deficit present.     Mental Status: She is alert.  Psychiatric:        Mood and Affect: Mood normal.        Behavior: Behavior normal.        Assessment And Plan:  Essential hypertension Assessment & Plan: Chronic, fair control. Goal BP<120/80.  She has improved control with lisinopril  10mg  daily. She will continue with current meds. Encouraged to follow low sodium diet. She will rto in 4 months for re-evaluation.   Orders: -     CMP14+EGFR  Medial tibial stress syndrome, unspecified laterality, initial encounter Assessment & Plan: Improvement with current management. Stretching before and after exercise has alleviated symptoms. - Use Voltaren gel for symptomatic relief. - Continue  stretching before and after exercise. - Continue using ice and heat therapy as needed.   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 An oil bubble is present to prevent recurrent scar tissue formation. A second opinion confirmed the current management plan. Despite a high surgical success rate, scar tissue formation led to recurrent detachment. - Continue follow-up with retina specialist in August. - Continue use of balance lens in affected eye. - She will consider discontinuing Curcumin supplements after current supply is finished.   Goiter -     TSH + free T4  General Health Maintenance Scheduled for a physical in September with comprehensive blood work,  including cholesterol. Current blood work focuses on kidney, liver, and thyroid  function due to supplement use. - Perform blood tests for kidney, liver, and thyroid  function today. - Schedule comprehensive blood work including cholesterol in September.  Return if symptoms worsen or fail to improve.  Patient was given opportunity to ask questions. Patient verbalized understanding of the plan and was able to repeat key elements of the plan. All questions were answered to their satisfaction.   I, Smiley Dung, MD, have reviewed all documentation for this visit. The documentation on 12/16/23 for the exam, diagnosis, procedures, and orders are all accurate and complete.   IF YOU HAVE BEEN REFERRED TO A SPECIALIST, IT MAY TAKE 1-2 WEEKS TO SCHEDULE/PROCESS THE REFERRAL. IF YOU HAVE NOT HEARD FROM US /SPECIALIST IN TWO WEEKS, PLEASE GIVE US  A CALL AT [phone].   THE PATIENT IS ENCOURAGED TO PRACTICE SOCIAL DISTANCING DUE TO THE COVID-19 PANDEMIC.

## 2023-12-16 NOTE — Assessment & Plan Note (Signed)
 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 An oil bubble is present to prevent recurrent scar tissue formation. A second opinion confirmed the current management plan. Despite a high surgical success rate, scar tissue formation led to recurrent detachment. - Continue follow-up with retina specialist in August. - Continue use of balance lens in affected eye. - She will consider discontinuing Curcumin supplements after current supply is finished.

## 2023-12-17 LAB — CMP14+EGFR
ALT: 14 IU/L (ref 0–32)
AST: 20 IU/L (ref 0–40)
Albumin: 4.7 g/dL (ref 3.9–4.9)
Alkaline Phosphatase: 68 IU/L (ref 44–121)
BUN/Creatinine Ratio: 20 (ref 12–28)
BUN: 16 mg/dL (ref 8–27)
Bilirubin Total: 0.3 mg/dL (ref 0.0–1.2)
CO2: 23 mmol/L (ref 20–29)
Calcium: 9.8 mg/dL (ref 8.7–10.3)
Chloride: 102 mmol/L (ref 96–106)
Creatinine, Ser: 0.79 mg/dL (ref 0.57–1.00)
Globulin, Total: 2.1 g/dL (ref 1.5–4.5)
Glucose: 77 mg/dL (ref 70–99)
Potassium: 5.1 mmol/L (ref 3.5–5.2)
Sodium: 139 mmol/L (ref 134–144)
Total Protein: 6.8 g/dL (ref 6.0–8.5)
eGFR: 84 mL/min/{1.73_m2} (ref >59)

## 2023-12-17 LAB — TSH+FREE T4
Free T4: 1.1 ng/dL (ref 0.82–1.77)
TSH: 0.468 u[IU]/mL (ref 0.450–4.500)

## 2023-12-18 NOTE — Assessment & Plan Note (Signed)
 Chronic, fair control. Goal BP<120/80.  She has improved control with lisinopril  10mg  daily. She will continue with current meds. Encouraged to follow low sodium diet. She will rto in 4 months for re-evaluation.

## 2023-12-19 ENCOUNTER — Ambulatory Visit: Payer: Self-pay | Admitting: Internal Medicine

## 2024-02-07 ENCOUNTER — Ambulatory Visit: Payer: Self-pay | Admitting: *Deleted

## 2024-02-07 NOTE — Telephone Encounter (Signed)
 FYI Only or Action Required?: FYI only for provider.  Patient was last seen in primary care on 12/16/2023 by Jarold Medici, MD. Called Nurse Triage reporting Pain. Symptoms began n/a no triage. Interventions attempted: Other: n/a no triage. Symptoms are: na/ no traige.  Triage Disposition: Information or Advice Only Call  Patient/caregiver understands and will follow disposition?: Yes  Appointment scheduled in person, in office by patient  Reason for Disposition  [1] Follow-up call to recent contact AND [2] information only call, no triage required  Answer Assessment - Initial Assessment Questions 1. REASON FOR CALL or QUESTION: What is your reason for calling today? or How can I best help you? or What question do you have that I can help answer?     Patient had no need for triage, no questions. Appointment already scheduled  Protocols used: Information Only Call - No Triage-A-AH

## 2024-02-07 NOTE — Telephone Encounter (Signed)
 Patient called to report joint pain and schedule appt, call was disconnected during transfer from PAS. NT attempted to contact patient back #[phone], no answer, LVMTCB #[phone].   Copied from CRM [id number]. Topic: Clinical - Red Word Triage >> Feb 07, 2024  9:16 AM Suzen RAMAN wrote: Red Word that prompted transfer to Nurse Triage: Joint Pain   ----------------------------------------------------------------------- From previous Reason for Contact - Scheduling: Patient/patient representative is calling to schedule an appointment. Refer to attachments for appointment information.

## 2024-02-21 NOTE — Progress Notes (Signed)
 Ben Zackeriah Kissler D.[facility] [address] [place] [address] Phone: [phone]   Assessment and Plan:    1. Bilateral leg pain (Primary) -Chronic with exacerbation, initial visit - Bilateral lower leg pain most consistent with musculoskeletal overuse from pickleball with open issues.  NTTP along tibial shaft, so not currently consistent with MTSS, though I believe likely patient is at the early stages of the spectrum that would progress to MTSS if left untreated - Start HEP focusing on lower leg musculature - May continue physical activity as tolerated - Start meloxicam  7.5 mg daily x2 weeks.  If still having pain after 2 weeks, complete 3rd-week of NSAID. May use remaining NSAID as needed once daily for pain control.  Do not to use additional over-the-counter NSAIDs (ibuprofen, naproxen, Advil, Aleve, etc.) while taking prescription NSAIDs.  May use Tylenol  [phone] mg 2 to 3 times a day for breakthrough pain.  -X-rays obtained in clinic.  My interpretation: No acute fracture or dislocation.  No sign of stress fracture.  15 additional minutes spent for educating Therapeutic Home Exercise Program.  This included exercises focusing on stretching, strengthening, with focus on eccentric aspects.   Long term goals include an improvement in range of motion, strength, endurance as well as avoiding reinjury. Patient's frequency would include in 1-2 times a day, 3-5 times a week for a duration of 6-12 weeks. Proper technique shown and discussed handout in great detail with ATC.  All questions were discussed and answered.    Pertinent previous records reviewed include none Internal medicine note 12/16/2023  Follow Up: 4 weeks for reevaluation.  If no improvement or worsening of symptoms, could consider ultrasound versus physical therapy versus prolonged rest for 6 to 8 weeks   Subjective:   I, Moenique Parris, am serving as a Neurosurgeon for Doctor Morene Mace  Chief Complaint: shin pain   HPI:   02/22/2024 Patient is a 63 year old female with shin pain. Patient states shin pain started in march intermittent. No pain when active only pain at rest. A throbbing pain. Ibu didn't help much. Avid pickle ball. She has been playing more. No numbness or tingling. Pain sometimes goes to the top of her feet. Pain wakes her in the middle of the night. She has two pairs of pickle ball shoes about a year old.   Relevant Historical Information: Hypertension, right retinal detachment  Additional pertinent review of systems negative.   Current Outpatient Medications:    meloxicam  (MOBIC ) 7.5 MG tablet, Take 1 tablet daily for 2 weeks.  If still in pain after 2 weeks, take 1 tablet daily for an additional 1 week., Disp: 30 tablet, Rfl: 0   albuterol  (VENTOLIN  HFA) 108 (90 Base) MCG/ACT inhaler, Inhale 2 puffs into the lungs every 6 (six) hours as needed for wheezing or shortness of breath., Disp: 18 g, Rfl: 0   benzonatate  (TESSALON ) 200 MG capsule, Take 1 capsule (200 mg total) by mouth 3 (three) times daily as needed for cough. (Patient not taking: Reported on 12/16/2023), Disp: 21 capsule, Rfl: 0   CALCIUM PO, Take by mouth., Disp: , Rfl:    cetirizine (ZYRTEC) 10 MG tablet, Take 10 mg by mouth daily. , Disp: , Rfl:    cholecalciferol (VITAMIN D ) 1000 UNITS tablet, Take 1,000 Units by mouth daily., Disp: , Rfl:    levofloxacin  (LEVAQUIN ) 500 MG tablet, Take 1 tablet (500 mg total) by mouth daily. (Patient not taking: Reported on 12/16/2023),  Disp: 7 tablet, Rfl: 0   lisinopril  (ZESTRIL ) 10 MG tablet, Take 1 tablet (10 mg total) by mouth daily., Disp: 90 tablet, Rfl: 1   Multiple Vitamins-Minerals (MULTIVITAMIN WITH MINERALS) tablet, Take 1 tablet by mouth daily., Disp: , Rfl:    Polyethylene Glycol 3350 (MIRALAX PO), Take by mouth., Disp: , Rfl:    Probiotic Product (PROBIOTIC-10 PO), Take 1 tablet by mouth daily., Disp: , Rfl:    Objective:      Vitals:   02/22/24 0756  BP: 106/80  Pulse: 77  SpO2: 98%  Weight: 99 lb (44.9 kg)  Height: 5' 1 (1.549 m)      Body mass index is 18.71 kg/m.    Physical Exam:    General: Well-appearing, cooperative, sitting comfortably in no acute distress.  HEENT: Normocephalic, atraumatic.   Neck: No gross abnormality.  Cardiovascular: No pallor or cyanosis. Resp: Comfortable WOB.   Abdomen: Non distended.   Skin: Warm and dry; no focal rashes identified on limited exam. MSK/extremities: No cyanosis or edema.  NTTP bilateral tibial shaft, distal leg musculature.  Full and equal strength bilaterally with resisted dorsiflexion, plantarflexion without pain.  Straight leg raise negative bilaterally Neuro: Gross motor and sensory intact. Gait normal. Psychiatric: Mood and affect are appropriate.    Electronically signed by:  Odis Mace D[facility] 8:24 AM 02/22/24

## 2024-02-22 ENCOUNTER — Ambulatory Visit (INDEPENDENT_AMBULATORY_CARE_PROVIDER_SITE_OTHER)

## 2024-02-22 ENCOUNTER — Ambulatory Visit: Admitting: Internal Medicine

## 2024-02-22 ENCOUNTER — Ambulatory Visit: Admitting: Sports Medicine

## 2024-02-22 ENCOUNTER — Telehealth: Payer: Self-pay | Admitting: Sports Medicine

## 2024-02-22 VITALS — BP 106/80 | HR 77 | Ht 61.0 in | Wt 99.0 lb

## 2024-02-22 DIAGNOSIS — M79604 Pain in right leg: Secondary | ICD-10-CM

## 2024-02-22 DIAGNOSIS — M79605 Pain in left leg: Secondary | ICD-10-CM | POA: Diagnosis not present

## 2024-02-22 MED ORDER — MELOXICAM 7.5 MG PO TABS: 7.5000 mg | ORAL_TABLET | Freq: Every day | ORAL | 0 refills | Status: DC

## 2024-02-22 MED ORDER — MELOXICAM 7.5 MG PO TABS: ORAL_TABLET | ORAL | 0 refills | Status: AC

## 2024-02-22 NOTE — Telephone Encounter (Signed)
 Called and spoke with patient.

## 2024-02-22 NOTE — Patient Instructions (Signed)
-   Start meloxicam  7.5 mg daily x2 weeks.  If still having pain after 2 weeks, complete 3rd-week of NSAID. May use remaining NSAID as needed once daily for pain control.  Do not to use additional over-the-counter NSAIDs (ibuprofen, naproxen, Advil, Aleve, etc.) while taking prescription NSAIDs.  May use Tylenol  [phone] mg 2 to 3 times a day for breakthrough pain. Shin HEP Recommend fleet feet or similar to discuss new footwear and possibly inserts 4 week follow up

## 2024-02-22 NOTE — Telephone Encounter (Signed)
 Patient called and asked if compression socks would help when she does sports.

## 2024-02-25 ENCOUNTER — Other Ambulatory Visit: Payer: Self-pay | Admitting: Internal Medicine

## 2024-02-28 ENCOUNTER — Ambulatory Visit: Payer: Self-pay | Admitting: Sports Medicine

## 2024-03-03 DIAGNOSIS — L931 Subacute cutaneous lupus erythematosus: Secondary | ICD-10-CM | POA: Diagnosis not present

## 2024-03-06 ENCOUNTER — Other Ambulatory Visit: Payer: Self-pay | Admitting: Internal Medicine

## 2024-03-06 DIAGNOSIS — Z1231 Encounter for screening mammogram for malignant neoplasm of breast: Secondary | ICD-10-CM

## 2024-03-14 DIAGNOSIS — H21541 Posterior synechiae (iris), right eye: Secondary | ICD-10-CM | POA: Diagnosis not present

## 2024-03-14 DIAGNOSIS — H3341 Traction detachment of retina, right eye: Secondary | ICD-10-CM | POA: Diagnosis not present

## 2024-03-14 DIAGNOSIS — H35412 Lattice degeneration of retina, left eye: Secondary | ICD-10-CM | POA: Diagnosis not present

## 2024-03-14 DIAGNOSIS — H4020X0 Unspecified primary angle-closure glaucoma, stage unspecified: Secondary | ICD-10-CM | POA: Diagnosis not present

## 2024-03-14 DIAGNOSIS — Z961 Presence of intraocular lens: Secondary | ICD-10-CM | POA: Diagnosis not present

## 2024-03-14 DIAGNOSIS — H33021 Retinal detachment with multiple breaks, right eye: Secondary | ICD-10-CM | POA: Diagnosis not present

## 2024-03-14 DIAGNOSIS — Z79899 Other long term (current) drug therapy: Secondary | ICD-10-CM | POA: Diagnosis not present

## 2024-03-14 DIAGNOSIS — H30031 Focal chorioretinal inflammation, peripheral, right eye: Secondary | ICD-10-CM | POA: Diagnosis not present

## 2024-03-21 ENCOUNTER — Ambulatory Visit: Admitting: Sports Medicine

## 2024-04-10 ENCOUNTER — Other Ambulatory Visit

## 2024-04-13 ENCOUNTER — Ambulatory Visit: Payer: Self-pay | Admitting: Internal Medicine

## 2024-04-13 ENCOUNTER — Ambulatory Visit (INDEPENDENT_AMBULATORY_CARE_PROVIDER_SITE_OTHER): Payer: 59 | Admitting: Internal Medicine

## 2024-04-13 ENCOUNTER — Encounter: Payer: Self-pay | Admitting: Internal Medicine

## 2024-04-13 VITALS — BP 130/78 | HR 85 | Temp 98.6°F | Ht 61.0 in | Wt 98.6 lb

## 2024-04-13 DIAGNOSIS — H3321 Serous retinal detachment, right eye: Secondary | ICD-10-CM | POA: Diagnosis not present

## 2024-04-13 DIAGNOSIS — I10 Essential (primary) hypertension: Secondary | ICD-10-CM | POA: Diagnosis not present

## 2024-04-13 DIAGNOSIS — R040 Epistaxis: Secondary | ICD-10-CM | POA: Diagnosis not present

## 2024-04-13 DIAGNOSIS — Z0000 Encounter for general adult medical examination without abnormal findings: Secondary | ICD-10-CM | POA: Diagnosis not present

## 2024-04-13 LAB — POCT URINALYSIS DIP (CLINITEK)
Bilirubin, UA: NEGATIVE
Blood, UA: NEGATIVE
Glucose, UA: NEGATIVE mg/dL
Ketones, POC UA: NEGATIVE mg/dL
Leukocytes, UA: NEGATIVE
Nitrite, UA: NEGATIVE
POC PROTEIN,UA: NEGATIVE
Spec Grav, UA: 1.01 (ref 1.010–1.025)
Urobilinogen, UA: 0.2 U/dL
pH, UA: 5.5 (ref 5.0–8.0)

## 2024-04-13 MED ORDER — LISINOPRIL 10 MG PO TABS: 10.0000 mg | ORAL_TABLET | Freq: Every day | ORAL | 1 refills | Status: DC

## 2024-04-13 NOTE — Progress Notes (Signed)
 0I,Victoria T Emmitt, CMA,acting as a neurosurgeon for Catheryn LOISE Slocumb, MD.,have documented all relevant documentation on the behalf of Catheryn LOISE Slocumb, MD,as directed by  Catheryn LOISE Slocumb, MD while in the presence of Catheryn LOISE Slocumb, MD.  Subjective:    Patient ID: Susan Mason , female    DOB: [birth year] , 63 y.o.   MRN: [id number]  Chief Complaint  Patient presents with   Annual Exam    Patient presents today for annual exam. She reports compliance with medications. Denies headaches, chest pain & sob.  She has a concern with nose bleeds lately. She states it lasts for 10-15 mins. Denies feeling light headed. Happens randomly.    Hypertension    HPI Discussed the use of AI scribe software for clinical note transcription with the patient, who gave verbal consent to proceed.  History of Present Illness Susan Mason is a 63 year old female who presents for a physical blood pressure check.  She has been experiencing intermittent episodes of epistaxis since August, with two occurrences that month. No associated symptoms such as dizziness are noted.  She has a history of shin splints for which she was prescribed meloxicam  in July. She took it for two weeks and reports significant improvement with the addition of exercises. She occasionally uses compression socks but has not had further issues.  Her current medications include albuterol  as needed, Zyrtec, and lisinopril  10 mg daily. She has discontinued meloxicam  and Tessalon  Perles.  She reports stable eye health following a visit to her retina doctor in August, with no changes noted and a follow-up scheduled for December. She has adjusted to having limited vision in her left eye.  She engages in regular physical activity, playing pickleball three times a week for three hours, walking, and doing light weights at home.  She takes Miralax daily to maintain regular bowel movements.  She is scheduled for a mammogram in October and had a bone  density test last year. Her last A1c was normal, and she had a calcium score of zero.   Hypertension This is a chronic problem. The current episode started more than 1 year ago. The problem has been gradually improving since onset. The problem is controlled. Associated symptoms include neck pain. Pertinent negatives include no blurred vision, chest pain, palpitations or shortness of breath. Risk factors for coronary artery disease include post-menopausal state. Past treatments include ACE inhibitors. The current treatment provides moderate improvement. There are no compliance problems.      Past Medical History:  Diagnosis Date   Abnormal Pap smear 08/03/2006   ASCUS   Granuloma annulare    High blood pressure    Metrorrhagia    Osteopenia    Ovarian cyst    Yeast vaginitis      Family History  Problem Relation Age of Onset   Hyperlipidemia Mother    Hypertension Mother    Heart Problems Mother    Kidney disease Mother    Lung cancer Father    Emphysema Father    Breast cancer Neg Hx      Current Outpatient Medications:    albuterol  (VENTOLIN  HFA) 108 (90 Base) MCG/ACT inhaler, Inhale 2 puffs into the lungs every 6 (six) hours as needed for wheezing or shortness of breath., Disp: 18 g, Rfl: 0   CALCIUM PO, Take by mouth., Disp: , Rfl:    cetirizine (ZYRTEC) 10 MG tablet, Take 10 mg by mouth daily. , Disp: , Rfl:    cholecalciferol (VITAMIN D )  1000 UNITS tablet, Take 1,000 Units by mouth daily., Disp: , Rfl:    meloxicam  (MOBIC ) 7.5 MG tablet, Take 1 tablet daily for 2 weeks.  If still in pain after 2 weeks, take 1 tablet daily for an additional 1 week., Disp: 30 tablet, Rfl: 0   Multiple Vitamins-Minerals (MULTIVITAMIN WITH MINERALS) tablet, Take 1 tablet by mouth daily., Disp: , Rfl:    Polyethylene Glycol 3350 (MIRALAX PO), Take by mouth., Disp: , Rfl:    Probiotic Product (PROBIOTIC-10 PO), Take 1 tablet by mouth daily., Disp: , Rfl:    lisinopril  (ZESTRIL ) 10 MG tablet,  Take 1 tablet (10 mg total) by mouth daily., Disp: 90 tablet, Rfl: 1   Allergies  Allergen Reactions   Clindamycin Rash   Nsaids Other (See Comments)   Penicillins    Doxycycline    Doxycycline Calcium Hives   Erythromycin    Erythromycin Base       The patient states she uses none for birth control. No LMP recorded. Patient is postmenopausal.. Negative for Dysmenorrhea. Negative for: breast discharge, breast lump(s), breast pain and breast self exam. Associated symptoms include abnormal vaginal bleeding. Pertinent negatives include abnormal bleeding (hematology), anxiety, decreased libido, depression, difficulty falling sleep, dyspareunia, history of infertility, nocturia, sexual dysfunction, sleep disturbances, urinary incontinence, urinary urgency, vaginal discharge and vaginal itching. Diet regular.The patient states her exercise level is  moderate.  . The patient's tobacco use is:  Social History   Tobacco Use  Smoking Status Never  Smokeless Tobacco Never  . She has been exposed to passive smoke. The patient's alcohol use is:  Social History   Substance and Sexual Activity  Alcohol Use No      Review of Systems  Constitutional: Negative.   HENT: Negative.    Eyes: Negative.  Negative for blurred vision.  Respiratory: Negative.  Negative for shortness of breath.   Cardiovascular: Negative.  Negative for chest pain and palpitations.  Gastrointestinal: Negative.   Endocrine: Negative.   Genitourinary: Negative.   Musculoskeletal:  Positive for neck pain.  Skin: Negative.   Allergic/Immunologic: Negative.   Neurological: Negative.   Hematological: Negative.   Psychiatric/Behavioral: Negative.       Today's Vitals   04/13/24 0907  BP: 130/78  Pulse: 85  Temp: 98.6 F (37 C)  SpO2: 98%  Weight: 98 lb 9.6 oz (44.7 kg)  Height: 5' 1 (1.549 m)   Body mass index is 18.63 kg/m.  Wt Readings from Last 3 Encounters:  04/13/24 98 lb 9.6 oz (44.7 kg)  02/22/24 99 lb  (44.9 kg)  12/16/23 98 lb 12.8 oz (44.8 kg)     Objective:  Physical Exam Vitals and nursing note reviewed.  Constitutional:      Appearance: Normal appearance.  HENT:     Head: Normocephalic and atraumatic.     Right Ear: Tympanic membrane, ear canal and external ear normal.     Left Ear: Tympanic membrane, ear canal and external ear normal.     Nose: Nose normal.     Mouth/Throat:     Mouth: Mucous membranes are moist.     Pharynx: Oropharynx is clear.  Eyes:     Extraocular Movements: Extraocular movements intact.     Conjunctiva/sclera: Conjunctivae normal.     Pupils: Pupils are equal, round, and reactive to light.     Comments: Dilated, irregular right pupil  Cardiovascular:     Rate and Rhythm: Normal rate and regular rhythm.     Pulses: Normal  pulses.     Heart sounds: Normal heart sounds.  Pulmonary:     Effort: Pulmonary effort is normal.     Breath sounds: Normal breath sounds.  Chest:  Breasts:    Tanner Score is 5.     Right: Normal.     Left: Normal.  Abdominal:     General: Abdomen is flat. Bowel sounds are normal.     Palpations: Abdomen is soft.  Genitourinary:    Comments: deferred Musculoskeletal:        General: Normal range of motion.     Cervical back: Normal range of motion and neck supple.  Skin:    General: Skin is warm and dry.  Neurological:     General: No focal deficit present.     Mental Status: She is alert and oriented to person, place, and time.  Psychiatric:        Mood and Affect: Mood normal.        Behavior: Behavior normal.         Assessment And Plan:     Annual physical exam Assessment & Plan: A full exam was performed.  Importance of monthly self breast exams was discussed with the patient.  She is advised to get 30-45 minutes of regular exercise, no less than four to five days per week. Both weight-bearing and aerobic exercises are recommended.  She is advised to follow a healthy diet with at least six fruits/veggies  per day, decrease intake of red meat and other saturated fats and to increase fish intake to twice weekly.  Meats/fish should not be fried -- baked, boiled or broiled is preferable. It is also important to cut back on your sugar intake.  Be sure to read labels - try to avoid anything with added sugar, high fructose corn syrup or other sweeteners.  If you must use a sweetener, you can try stevia or monkfruit.  It is also important to avoid artificially sweetened foods/beverages and diet drinks. Lastly, wear SPF 50 sunscreen on exposed skin and when in direct sunlight for an extended period of time.  Be sure to avoid fast food restaurants and aim for at least 60 ounces of water daily.      Orders: -     CBC -     CMP14+EGFR -     Lipid panel  Essential hypertension Assessment & Plan: Hypertension managed with lisinopril . Monitor during epistaxis.EKG performed, NSR w/o acute changes.  - Refill lisinopril  10 mg daily. - Schedule six-month blood pressure check. - Monitor blood pressure during epistaxis.  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 - Continue follow-up with retina specialist, next appointment in December.   Epistaxis Assessment & Plan: Intermittent epistaxis likely due to dry air. - Check blood pressure during episodes. - Use Vaseline in nostrils at night. - Consider humidifier use. - Use OTC saline spray or 'Air' product.   Other orders -     Lisinopril ; Take 1 tablet (10 mg total) by mouth daily.  Dispense: 90 tablet; Refill: 1   Return in 1 year (on 04/13/2025), or AWV, for 6 month bp. Patient was given opportunity to ask questions. Patient verbalized understanding of the plan and was able to repeat key elements of the plan. All questions were answered to their satisfaction.  I, Catheryn LOISE Slocumb, MD,  have reviewed all documentation for this visit. The documentation on 04/13/24 for the exam, diagnosis, procedures, and orders are all accurate and complete.

## 2024-04-13 NOTE — Patient Instructions (Signed)

## 2024-04-14 LAB — CMP14+EGFR
ALT: 13 IU/L (ref 0–32)
AST: 20 IU/L (ref 0–40)
Albumin: 4.5 g/dL (ref 3.9–4.9)
Alkaline Phosphatase: 62 IU/L (ref 44–121)
BUN/Creatinine Ratio: 22 (ref 12–28)
BUN: 20 mg/dL (ref 8–27)
Bilirubin Total: 0.3 mg/dL (ref 0.0–1.2)
CO2: 22 mmol/L (ref 20–29)
Calcium: 9.4 mg/dL (ref 8.7–10.3)
Chloride: 100 mmol/L (ref 96–106)
Creatinine, Ser: 0.9 mg/dL (ref 0.57–1.00)
Globulin, Total: 2.1 g/dL (ref 1.5–4.5)
Glucose: 77 mg/dL (ref 70–99)
Potassium: 4.8 mmol/L (ref 3.5–5.2)
Sodium: 137 mmol/L (ref 134–144)
Total Protein: 6.6 g/dL (ref 6.0–8.5)
eGFR: 71 mL/min/1.73 (ref >59)

## 2024-04-14 LAB — LIPID PANEL
Chol/HDL Ratio: 2.7 ratio (ref 0.0–4.4)
Cholesterol, Total: 184 mg/dL (ref 100–199)
HDL: 68 mg/dL (ref >39)
LDL Chol Calc (NIH): 107 mg/dL — ABNORMAL HIGH (ref 0–99)
Triglycerides: 47 mg/dL (ref 0–149)
VLDL Cholesterol Cal: 9 mg/dL (ref 5–40)

## 2024-04-14 LAB — CBC
Hematocrit: 39.4 % (ref 34.0–46.6)
Hemoglobin: 12.9 g/dL (ref 11.1–15.9)
MCH: 30.9 pg (ref 26.6–33.0)
MCHC: 32.7 g/dL (ref 31.5–35.7)
MCV: 94 fL (ref 79–97)
Platelets: 265 x10E3/uL (ref 150–450)
RBC: 4.18 x10E6/uL (ref 3.77–5.28)
RDW: 13.1 % (ref 11.7–15.4)
WBC: 4.1 x10E3/uL (ref 3.4–10.8)

## 2024-04-14 LAB — MICROALBUMIN / CREATININE URINE RATIO
Creatinine, Urine: 28.4 mg/dL
Microalb/Creat Ratio: <11 mg/g{creat} (ref 0–29)
Microalbumin, Urine: <3 ug/mL

## 2024-04-16 DIAGNOSIS — R040 Epistaxis: Secondary | ICD-10-CM | POA: Insufficient documentation

## 2024-04-16 NOTE — Assessment & Plan Note (Signed)
 Intermittent epistaxis likely due to dry air. - Check blood pressure during episodes. - Use Vaseline in nostrils at night. - Consider humidifier use. - Use OTC saline spray or 'Air' product.

## 2024-04-16 NOTE — Assessment & Plan Note (Signed)

## 2024-04-16 NOTE — Assessment & Plan Note (Signed)
 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 - Continue follow-up with retina specialist, next appointment in December.

## 2024-04-16 NOTE — Assessment & Plan Note (Signed)
 Hypertension managed with lisinopril . Monitor during epistaxis.EKG performed, NSR w/o acute changes.  - Refill lisinopril  10 mg daily. - Schedule six-month blood pressure check. - Monitor blood pressure during epistaxis.

## 2024-05-09 ENCOUNTER — Ambulatory Visit
Admission: RE | Admit: 2024-05-09 | Discharge: 2024-05-09 | Disposition: A | Source: Ambulatory Visit | Attending: Internal Medicine | Admitting: Internal Medicine

## 2024-05-09 DIAGNOSIS — Z1231 Encounter for screening mammogram for malignant neoplasm of breast: Secondary | ICD-10-CM

## 2024-06-12 DIAGNOSIS — Z961 Presence of intraocular lens: Secondary | ICD-10-CM | POA: Diagnosis not present

## 2024-06-12 DIAGNOSIS — H35032 Hypertensive retinopathy, left eye: Secondary | ICD-10-CM | POA: Diagnosis not present

## 2024-06-12 DIAGNOSIS — H2512 Age-related nuclear cataract, left eye: Secondary | ICD-10-CM | POA: Diagnosis not present

## 2024-07-18 DIAGNOSIS — H4020X0 Unspecified primary angle-closure glaucoma, stage unspecified: Secondary | ICD-10-CM | POA: Diagnosis not present

## 2024-07-18 DIAGNOSIS — H35412 Lattice degeneration of retina, left eye: Secondary | ICD-10-CM | POA: Diagnosis not present

## 2024-07-18 DIAGNOSIS — H3341 Traction detachment of retina, right eye: Secondary | ICD-10-CM | POA: Diagnosis not present

## 2024-07-18 DIAGNOSIS — Z961 Presence of intraocular lens: Secondary | ICD-10-CM | POA: Diagnosis not present

## 2024-07-18 DIAGNOSIS — H30031 Focal chorioretinal inflammation, peripheral, right eye: Secondary | ICD-10-CM | POA: Diagnosis not present

## 2024-07-18 DIAGNOSIS — H33021 Retinal detachment with multiple breaks, right eye: Secondary | ICD-10-CM | POA: Diagnosis not present

## 2024-07-18 DIAGNOSIS — H21541 Posterior synechiae (iris), right eye: Secondary | ICD-10-CM | POA: Diagnosis not present

## 2024-07-18 DIAGNOSIS — Z79899 Other long term (current) drug therapy: Secondary | ICD-10-CM | POA: Diagnosis not present

## 2024-08-23 ENCOUNTER — Other Ambulatory Visit: Payer: Self-pay | Admitting: Internal Medicine

## 2024-10-11 ENCOUNTER — Ambulatory Visit: Payer: Self-pay

## 2025-04-19 ENCOUNTER — Encounter: Payer: Self-pay | Admitting: Internal Medicine
